# Patient Record
Sex: Female | Born: 1950 | Race: White | Hispanic: No | Marital: Married | Smoking: Former smoker
Health system: Southern US, Community
[De-identification: ages and names within clinical notes are randomized; demographics above are authoritative.]

## PROBLEM LIST (undated history)

## (undated) DIAGNOSIS — I878 Other specified disorders of veins: Secondary | ICD-10-CM

## (undated) DIAGNOSIS — B029 Zoster without complications: Secondary | ICD-10-CM

## (undated) DIAGNOSIS — F32A Depression, unspecified: Secondary | ICD-10-CM

## (undated) DIAGNOSIS — IMO0001 Reserved for inherently not codable concepts without codable children: Secondary | ICD-10-CM

## (undated) DIAGNOSIS — K219 Gastro-esophageal reflux disease without esophagitis: Secondary | ICD-10-CM

## (undated) DIAGNOSIS — N952 Postmenopausal atrophic vaginitis: Secondary | ICD-10-CM

## (undated) DIAGNOSIS — F329 Major depressive disorder, single episode, unspecified: Secondary | ICD-10-CM

## (undated) DIAGNOSIS — R600 Localized edema: Secondary | ICD-10-CM

## (undated) DIAGNOSIS — Z87442 Personal history of urinary calculi: Secondary | ICD-10-CM

## (undated) DIAGNOSIS — I10 Essential (primary) hypertension: Secondary | ICD-10-CM

## (undated) DIAGNOSIS — I890 Lymphedema, not elsewhere classified: Secondary | ICD-10-CM

## (undated) DIAGNOSIS — R413 Other amnesia: Secondary | ICD-10-CM

## (undated) DIAGNOSIS — K760 Fatty (change of) liver, not elsewhere classified: Secondary | ICD-10-CM

## (undated) DIAGNOSIS — E669 Obesity, unspecified: Secondary | ICD-10-CM

## (undated) DIAGNOSIS — K5792 Diverticulitis of intestine, part unspecified, without perforation or abscess without bleeding: Secondary | ICD-10-CM

## (undated) DIAGNOSIS — E785 Hyperlipidemia, unspecified: Secondary | ICD-10-CM

## (undated) DIAGNOSIS — I719 Aortic aneurysm of unspecified site, without rupture: Secondary | ICD-10-CM

## (undated) DIAGNOSIS — M1990 Unspecified osteoarthritis, unspecified site: Secondary | ICD-10-CM

## (undated) DIAGNOSIS — L570 Actinic keratosis: Secondary | ICD-10-CM

## (undated) HISTORY — DX: Reserved for inherently not codable concepts without codable children: IMO0001

## (undated) HISTORY — DX: Zoster without complications: B02.9

## (undated) HISTORY — PX: APPENDECTOMY: SHX54

## (undated) HISTORY — PX: OTHER SURGICAL HISTORY: SHX169

## (undated) HISTORY — DX: Diverticulitis of intestine, part unspecified, without perforation or abscess without bleeding: K57.92

## (undated) HISTORY — DX: Postmenopausal atrophic vaginitis: N95.2

## (undated) HISTORY — DX: Gastro-esophageal reflux disease without esophagitis: K21.9

## (undated) HISTORY — PX: AUGMENTATION MAMMAPLASTY: SUR837

## (undated) HISTORY — DX: Essential (primary) hypertension: I10

## (undated) HISTORY — PX: EYE SURGERY: SHX253

## (undated) HISTORY — DX: Hyperlipidemia, unspecified: E78.5

## (undated) HISTORY — PX: TOTAL KNEE ARTHROPLASTY: SHX125

## (undated) HISTORY — DX: Fatty (change of) liver, not elsewhere classified: K76.0

## (undated) HISTORY — DX: Aortic aneurysm of unspecified site, without rupture: I71.9

## (undated) HISTORY — PX: LIPOSUCTION: SHX10

## (undated) HISTORY — DX: Other amnesia: R41.3

## (undated) HISTORY — DX: Localized edema: R60.0

## (undated) HISTORY — DX: Depression, unspecified: F32.A

## (undated) HISTORY — DX: Major depressive disorder, single episode, unspecified: F32.9

## (undated) HISTORY — PX: KIDNEY STONE SURGERY: SHX686

## (undated) HISTORY — PX: ABDOMINAL HYSTERECTOMY: SHX81

## (undated) HISTORY — PX: CYSTOSCOPY/RETROGRADE/URETEROSCOPY/STONE EXTRACTION WITH BASKET: SHX5317

## (undated) HISTORY — PX: KNEE SURGERY: SHX244

## (undated) HISTORY — DX: Obesity, unspecified: E66.9

## (undated) HISTORY — DX: Unspecified osteoarthritis, unspecified site: M19.90

---

## 1999-05-22 ENCOUNTER — Ambulatory Visit (HOSPITAL_COMMUNITY): Admission: RE | Admit: 1999-05-22 | Discharge: 1999-05-22 | Payer: Self-pay | Admitting: General Surgery

## 2001-06-28 ENCOUNTER — Encounter: Payer: Self-pay | Admitting: Family Medicine

## 2001-06-28 ENCOUNTER — Encounter: Admission: RE | Admit: 2001-06-28 | Discharge: 2001-06-28 | Payer: Self-pay | Admitting: Family Medicine

## 2001-12-20 ENCOUNTER — Ambulatory Visit (HOSPITAL_COMMUNITY): Admission: RE | Admit: 2001-12-20 | Discharge: 2001-12-20 | Payer: Self-pay | Admitting: Gastroenterology

## 2002-04-17 ENCOUNTER — Other Ambulatory Visit: Admission: RE | Admit: 2002-04-17 | Discharge: 2002-04-17 | Payer: Self-pay | Admitting: Obstetrics and Gynecology

## 2003-01-09 ENCOUNTER — Encounter: Payer: Self-pay | Admitting: Family Medicine

## 2003-01-09 ENCOUNTER — Encounter: Admission: RE | Admit: 2003-01-09 | Discharge: 2003-01-09 | Payer: Self-pay | Admitting: Family Medicine

## 2003-02-13 ENCOUNTER — Ambulatory Visit (HOSPITAL_COMMUNITY): Admission: RE | Admit: 2003-02-13 | Discharge: 2003-02-13 | Payer: Self-pay | Admitting: Orthopedic Surgery

## 2003-03-17 ENCOUNTER — Encounter: Payer: Self-pay | Admitting: *Deleted

## 2003-03-17 ENCOUNTER — Emergency Department (HOSPITAL_COMMUNITY): Admission: EM | Admit: 2003-03-17 | Discharge: 2003-03-17 | Payer: Self-pay | Admitting: *Deleted

## 2003-04-19 ENCOUNTER — Ambulatory Visit (HOSPITAL_COMMUNITY): Admission: RE | Admit: 2003-04-19 | Discharge: 2003-04-19 | Payer: Self-pay | Admitting: Family Medicine

## 2003-04-19 ENCOUNTER — Encounter: Payer: Self-pay | Admitting: Family Medicine

## 2003-04-23 ENCOUNTER — Other Ambulatory Visit: Admission: RE | Admit: 2003-04-23 | Discharge: 2003-04-23 | Payer: Self-pay | Admitting: Obstetrics and Gynecology

## 2004-02-18 ENCOUNTER — Ambulatory Visit (HOSPITAL_BASED_OUTPATIENT_CLINIC_OR_DEPARTMENT_OTHER): Admission: RE | Admit: 2004-02-18 | Discharge: 2004-02-18 | Payer: Self-pay | Admitting: Family Medicine

## 2004-04-29 ENCOUNTER — Other Ambulatory Visit: Admission: RE | Admit: 2004-04-29 | Discharge: 2004-04-29 | Payer: Self-pay | Admitting: Obstetrics and Gynecology

## 2005-05-18 ENCOUNTER — Other Ambulatory Visit: Admission: RE | Admit: 2005-05-18 | Discharge: 2005-05-18 | Payer: Self-pay | Admitting: Obstetrics and Gynecology

## 2006-05-04 ENCOUNTER — Encounter: Admission: RE | Admit: 2006-05-04 | Discharge: 2006-05-04 | Payer: Self-pay | Admitting: Family Medicine

## 2006-07-19 ENCOUNTER — Other Ambulatory Visit: Admission: RE | Admit: 2006-07-19 | Discharge: 2006-07-19 | Payer: Self-pay | Admitting: Obstetrics and Gynecology

## 2007-07-09 ENCOUNTER — Emergency Department (HOSPITAL_COMMUNITY): Admission: EM | Admit: 2007-07-09 | Discharge: 2007-07-09 | Payer: Self-pay | Admitting: Emergency Medicine

## 2007-07-21 ENCOUNTER — Other Ambulatory Visit: Admission: RE | Admit: 2007-07-21 | Discharge: 2007-07-21 | Payer: Self-pay | Admitting: Obstetrics and Gynecology

## 2008-03-21 ENCOUNTER — Encounter: Admission: RE | Admit: 2008-03-21 | Discharge: 2008-03-21 | Payer: Self-pay | Admitting: Family Medicine

## 2008-09-02 ENCOUNTER — Encounter: Admission: RE | Admit: 2008-09-02 | Discharge: 2008-09-02 | Payer: Self-pay | Admitting: Family Medicine

## 2008-10-15 ENCOUNTER — Encounter: Payer: Self-pay | Admitting: Obstetrics and Gynecology

## 2008-10-15 ENCOUNTER — Ambulatory Visit: Payer: Self-pay | Admitting: Obstetrics and Gynecology

## 2008-10-15 ENCOUNTER — Other Ambulatory Visit: Admission: RE | Admit: 2008-10-15 | Discharge: 2008-10-15 | Payer: Self-pay | Admitting: Obstetrics and Gynecology

## 2009-01-31 ENCOUNTER — Encounter: Admission: RE | Admit: 2009-01-31 | Discharge: 2009-01-31 | Payer: Self-pay | Admitting: Family Medicine

## 2009-03-12 ENCOUNTER — Ambulatory Visit: Payer: Self-pay | Admitting: Vascular Surgery

## 2009-05-02 ENCOUNTER — Encounter: Payer: Self-pay | Admitting: Pulmonary Disease

## 2009-05-12 ENCOUNTER — Encounter: Payer: Self-pay | Admitting: Pulmonary Disease

## 2009-05-12 ENCOUNTER — Ambulatory Visit (HOSPITAL_COMMUNITY): Admission: RE | Admit: 2009-05-12 | Discharge: 2009-05-12 | Payer: Self-pay | Admitting: Family Medicine

## 2009-06-03 ENCOUNTER — Encounter: Payer: Self-pay | Admitting: *Deleted

## 2009-06-03 DIAGNOSIS — K219 Gastro-esophageal reflux disease without esophagitis: Secondary | ICD-10-CM | POA: Insufficient documentation

## 2009-06-03 DIAGNOSIS — E785 Hyperlipidemia, unspecified: Secondary | ICD-10-CM | POA: Insufficient documentation

## 2009-06-03 DIAGNOSIS — I059 Rheumatic mitral valve disease, unspecified: Secondary | ICD-10-CM | POA: Insufficient documentation

## 2009-06-03 DIAGNOSIS — I10 Essential (primary) hypertension: Secondary | ICD-10-CM | POA: Insufficient documentation

## 2009-06-04 ENCOUNTER — Ambulatory Visit: Payer: Self-pay | Admitting: Pulmonary Disease

## 2009-06-04 DIAGNOSIS — R0602 Shortness of breath: Secondary | ICD-10-CM | POA: Insufficient documentation

## 2009-06-04 HISTORY — DX: Shortness of breath: R06.02

## 2009-06-18 ENCOUNTER — Encounter: Admission: RE | Admit: 2009-06-18 | Discharge: 2009-06-18 | Payer: Self-pay | Admitting: Surgery

## 2009-10-29 ENCOUNTER — Other Ambulatory Visit: Admission: RE | Admit: 2009-10-29 | Discharge: 2009-10-29 | Payer: Self-pay | Admitting: Obstetrics and Gynecology

## 2009-10-29 ENCOUNTER — Ambulatory Visit: Payer: Self-pay | Admitting: Obstetrics and Gynecology

## 2009-11-28 ENCOUNTER — Ambulatory Visit: Payer: Self-pay | Admitting: Obstetrics and Gynecology

## 2011-02-17 ENCOUNTER — Other Ambulatory Visit (HOSPITAL_COMMUNITY)
Admission: RE | Admit: 2011-02-17 | Discharge: 2011-02-17 | Disposition: A | Discharge status: Home / Self Care | Payer: 59 | Source: Ambulatory Visit | Attending: Obstetrics and Gynecology | Admitting: Obstetrics and Gynecology

## 2011-02-17 ENCOUNTER — Encounter (INDEPENDENT_AMBULATORY_CARE_PROVIDER_SITE_OTHER): Payer: 59 | Admitting: Obstetrics and Gynecology

## 2011-02-17 ENCOUNTER — Other Ambulatory Visit: Payer: Self-pay | Admitting: Obstetrics and Gynecology

## 2011-02-17 DIAGNOSIS — Z833 Family history of diabetes mellitus: Secondary | ICD-10-CM

## 2011-02-17 DIAGNOSIS — Z124 Encounter for screening for malignant neoplasm of cervix: Secondary | ICD-10-CM | POA: Insufficient documentation

## 2011-02-17 DIAGNOSIS — E079 Disorder of thyroid, unspecified: Secondary | ICD-10-CM

## 2011-02-17 DIAGNOSIS — R823 Hemoglobinuria: Secondary | ICD-10-CM

## 2011-02-17 DIAGNOSIS — Z01419 Encounter for gynecological examination (general) (routine) without abnormal findings: Secondary | ICD-10-CM

## 2011-02-17 DIAGNOSIS — Z13220 Encounter for screening for lipoid disorders: Secondary | ICD-10-CM

## 2011-03-02 NOTE — Consult Note (Signed)
NEW PATIENT CONSULTATION   [name], [name]  DOB:  [birth year]                                       03/12/2009  FGBMS#:[id number]   The patient presents today for evaluation of bilateral lower extremity  swelling and night cramps.  She is a very pleasant, healthy, 60 year old  female who reports that she is having increasingly severe night cramps.  She reports that these awaken her and that she has to walk for relief.  She also reports that she is having increased swelling in both lower  extremities.  She noticed this most around her distal calves and ankles.  This is relieved with elevating her legs on a cushion overnight.  She  does not have any pain associated with this other than the discomfort  with the night cramps.   PAST MEDICAL HISTORY:  Significant for hypertension and elevated  cholesterol.   FAMILY HISTORY:  Negative for premature atherosclerotic disease.   SOCIAL HISTORY:  She is married with two children.  She does not smoke,  having quit in 1999.  She does not drink alcohol on a regular basis.   REVIEW OF SYSTEMS:  Positive for weight gain, shortness of breath with  exertion, arthritic joint pain.   ALLERGIES:  MORPHINE.   CURRENT MEDICATIONS:  1. Micardis.  2. Crestor.  3. Pepcid.   PHYSICAL EXAMINATION:  Vital signs:  Heart rate is 88, blood pressure  128/88, pulse 18.  Her dorsalis pedis pulses are 2+ bilaterally.  She  does have moderate pitting edema of both lower extremities.  She does  not have any evidence of varicose veins or reticular veins.  She does  have a few scattered telangiectasias over both lower extremities.   She underwent screening duplex by myself and this showed normal caliber  saphenous vein with no evidence of reflux.  I discussed the significance  of this with the patient.  I explained that the night cramping is not  related to arterial or venous insufficiency.  I explained that in the  past quinine was  available for this, but is no longer available.  I also  explained that the swelling does not appear to be related to any  evidence of venous insufficiency.  I explained that this is simply  excess fluid and the treatment would be elevation, compression and  possible diuretic treatment.  I did explain the pressure garments to  her.  I do feel that I would recommend this at this time since her  symptoms are mild and would not warrant the aggravation of compression  garments.  She understands this is a possibility if she has progression  of her swelling.  She was reassured with this discussion and will see [place]  again on an as-needed basis.   Rosetta Posner, M.D.  Electronically Signed   TFE/MEDQ  D:  03/12/2009  T:  03/13/2009  Job:  [id number]   cc:   Osvaldo Human, M.D.

## 2011-03-05 NOTE — Procedures (Signed)
[place]  Patient:    DEOLA, REWIS Visit Number: [id number] MRN: [id number]          Service Type: END Location: ENDO Attending Physician:  Sherrin Daisy Dictated by:   Joyice Faster. Oletta Lamas, M.D. Proc. Date: 12/20/01 Admit Date:  12/20/2001   CC:         Lindwood Qua, M.D.   Procedure Report  DATE OF BIRTH:  October 08, 1951  PROCEDURE PERFORMED:  Colonoscopy and coagulation of polyps.  ENDOSCOPIST:  Joyice Faster. Oletta Lamas, M.D.  MEDICATIONS USED:  Fentanyl 150 mcg, Versed 10 mg IV.  INSTRUMENT:  Pediatric Olympus video colonoscope.  INDICATIONS:  Strong family history of colon polyps.  DESCRIPTION OF PROCEDURE:  The procedure had been explained to the patient and consent obtained.  With the patient in the left lateral decubitus position, the Olympus pediatric video colonoscope was inserted and advanced under direct visualization.  The prep was excellent.  The patient had moderate diverticulosis.  We were able to easily pass this to advance to the cecum. The ileocecal valve and appendiceal orifice were seen.  The scope was withdrawn.  The cecum, ascending colon, hepatic flexure, transverse colon, splenic flexure, descending colon were seen well.  In the middescending colon a 3 to 4 mm sessile polyp was seen.  It was fairly flat.  I removed it with a snare, but unfortunately it was so small there was no tissue left after we closed the snare.  The remainder of the descending colon and the sigmoid colon were normal other than diverticulosis.  There were no polyps in the rectum. Scope withdrawn, patient tolerated the procedure well.  Maintained on low flow oxygen and pulse oximeter throughout the procedure.  ASSESSMENT: 1. Small polyp in the descending colon lost. 2. Moderate diverticulosis.  PLAN:  Routine follow-up with a repeat in five years.  Will send home on polypectomy precautions. Dictated by:   Joyice Faster. Oletta Lamas,  M.D. Attending Physician:  Sherrin Daisy DD:  12/20/01 TD:  12/20/01 Job: [id number]

## 2011-03-05 NOTE — Op Note (Signed)
   NAME:  Patricia [name], Patricia [name]                     ACCOUNT NO.:  [id number]   MEDICAL RECORD NO.:  [id number]                   PATIENT TYPE:  AMB   LOCATION:  DAY                                  FACILITY:  [facility]   PHYSICIAN:  Ronald A. Gladstone Lighter, M.D.             DATE OF BIRTH:  Dec 26, 1950   DATE OF PROCEDURE:  02/13/2003  DATE OF DISCHARGE:  02/13/2003                                 OPERATIVE REPORT   SURGEON:  Jori Moll A. Gioffre, M.D.   ASSISTANT:  None.   PROCEDURE:  1. Diagnostic arthroscopy, left knee.  2. Medial meniscectomy, left knee.  3. Abrasion chondroplasty of the medial femoral condyle, left knee.  4. Synovectomy of the suprapatellar pouch, left knee.   PREOPERATIVE DIAGNOSES:  1. Tear of the medial meniscus.  2. Degenerative arthritis, left knee.   POSTOPERATIVE DIAGNOSES:  1. Tear of the medial meniscus.  2. Degenerative arthritis, left knee.   DESCRIPTION OF PROCEDURE:  After routine orthopedic prep and draping of the  left knee was carried out, a small punctate incision was made in the  suprapatellar pouch. The inflow cannula was inserted and the knee was  distended with saline. At this time, the arthroscope was entered from the  lateral approach. Complete diagnostic arthroscopy was carried out and I  noted she had a tear of the medial meniscus so I introduced the shaver  suction device and did a medial meniscectomy. She also had rather  significant arthritic changes of the medial femoral condyle and introduced  the shaver suction device and did an abrasion chondroplasty. 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 I introduced the shaver suction  device and did a synovectomy. No other abnormalities were noted. The lateral  joint space looked fine. I thoroughly irrigated out the wound and closed all  three punctate incisions with 3-0 nylon suture. I injected 30 mL of 0.5%  Marcaine with epinephrine into the knee joint and applied a sterile  dressing. The patient left the operating room in satisfactory condition. She  had 1 g of IV Ancef preop. This was done under general anesthesia.                                               Ronald A. Gladstone Lighter, M.D.    RAG/MEDQ  D:  03/14/2003  T:  03/14/2003  Job:  [id number]

## 2011-07-29 LAB — DIFFERENTIAL
Eosinophils Absolute: 0
Lymphocytes Relative: 16
Lymphs Abs: 1.7
Monocytes Relative: 5
Neutrophils Relative %: 78 — ABNORMAL HIGH

## 2011-07-29 LAB — CBC
Hemoglobin: 13.8
MCHC: 35.2
MCV: 88.3
RDW: 13

## 2011-07-29 LAB — COMPREHENSIVE METABOLIC PANEL
ALT: 21
Calcium: 9.5
Creatinine, Ser: 0.61
GFR calc Af Amer: >60
GFR calc non Af Amer: >60
Glucose, Bld: 101 — ABNORMAL HIGH
Sodium: 141
Total Protein: 6.9

## 2011-10-23 ENCOUNTER — Emergency Department (HOSPITAL_COMMUNITY)
Admission: EM | Admit: 2011-10-23 | Discharge: 2011-10-24 | Disposition: A | Discharge status: Home / Self Care | Payer: 59 | Attending: Emergency Medicine | Admitting: Emergency Medicine

## 2011-10-23 ENCOUNTER — Encounter: Payer: Self-pay | Admitting: *Deleted

## 2011-10-23 ENCOUNTER — Emergency Department (HOSPITAL_COMMUNITY)
Admission: EM | Admit: 2011-10-23 | Discharge: 2011-10-23 | Disposition: A | Discharge status: Home / Self Care | Payer: 59

## 2011-10-23 ENCOUNTER — Other Ambulatory Visit: Payer: Self-pay

## 2011-10-23 DIAGNOSIS — Z79899 Other long term (current) drug therapy: Secondary | ICD-10-CM | POA: Insufficient documentation

## 2011-10-23 DIAGNOSIS — R110 Nausea: Secondary | ICD-10-CM | POA: Insufficient documentation

## 2011-10-23 DIAGNOSIS — M549 Dorsalgia, unspecified: Secondary | ICD-10-CM

## 2011-10-23 DIAGNOSIS — R42 Dizziness and giddiness: Secondary | ICD-10-CM | POA: Insufficient documentation

## 2011-10-23 DIAGNOSIS — E7800 Pure hypercholesterolemia, unspecified: Secondary | ICD-10-CM | POA: Insufficient documentation

## 2011-10-23 DIAGNOSIS — M546 Pain in thoracic spine: Secondary | ICD-10-CM | POA: Insufficient documentation

## 2011-10-23 DIAGNOSIS — I10 Essential (primary) hypertension: Secondary | ICD-10-CM | POA: Insufficient documentation

## 2011-10-23 DIAGNOSIS — R61 Generalized hyperhidrosis: Secondary | ICD-10-CM | POA: Insufficient documentation

## 2011-10-23 DIAGNOSIS — R079 Chest pain, unspecified: Secondary | ICD-10-CM | POA: Insufficient documentation

## 2011-10-23 NOTE — ED Notes (Signed)
Pt reports CP that was substernal, radiating through to pts back, and (R) jaw and (R) arm.  Reports taking a tums without much relief.  Denies SOB, reports slight SOB.  Skin warm, dry and intact.

## 2011-10-23 NOTE — ED Notes (Signed)
Patient has been called three times without answer

## 2011-10-24 ENCOUNTER — Emergency Department (HOSPITAL_COMMUNITY): Payer: 59

## 2011-10-24 LAB — CBC
HCT: 43.7 % (ref 36.0–46.0)
Hemoglobin: 14.8 g/dL (ref 12.0–15.0)
MCH: 30.4 pg (ref 26.0–34.0)
MCV: 89.7 fL (ref 78.0–100.0)
Platelets: 271 10*3/uL (ref 150–400)
RBC: 4.87 MIL/uL (ref 3.87–5.11)
WBC: 6.4 10*3/uL (ref 4.0–10.5)

## 2011-10-24 LAB — BASIC METABOLIC PANEL
BUN: 17 mg/dL (ref 6–23)
CO2: 34 mEq/L — ABNORMAL HIGH (ref 19–32)
Calcium: 9.7 mg/dL (ref 8.4–10.5)
Chloride: 97 mEq/L (ref 96–112)
Creatinine, Ser: 0.72 mg/dL (ref 0.50–1.10)
Glucose, Bld: 99 mg/dL (ref 70–99)

## 2011-10-24 LAB — POCT I-STAT TROPONIN I: Troponin i, poc: 0 ng/mL (ref 0.00–0.08)

## 2011-10-24 MED ORDER — IOHEXOL 350 MG/ML SOLN
100.0000 mL | Freq: Once | INTRAVENOUS | Status: AC | PRN
  Administered 2011-10-24: 100 mL via INTRAVENOUS

## 2011-10-24 MED ORDER — ONDANSETRON HCL 4 MG/2ML IJ SOLN
4.0000 mg | Freq: Once | INTRAMUSCULAR | Status: AC
  Administered 2011-10-24: 4 mg via INTRAVENOUS
  Filled 2011-10-24: qty 2

## 2011-10-24 MED ORDER — FENTANYL CITRATE 0.05 MG/ML IJ SOLN
50.0000 ug | Freq: Once | INTRAMUSCULAR | Status: AC
  Administered 2011-10-24: 50 ug via INTRAVENOUS
  Filled 2011-10-24: qty 2

## 2011-10-24 MED ORDER — LOPERAMIDE HCL 2 MG PO CAPS
4.0000 mg | ORAL_CAPSULE | Freq: Once | ORAL | Status: AC
  Administered 2011-10-24: 4 mg via ORAL
  Filled 2011-10-24: qty 2

## 2011-10-24 MED ORDER — FENTANYL CITRATE 0.05 MG/ML IJ SOLN
25.0000 ug | INTRAMUSCULAR | Status: DC | PRN
  Administered 2011-10-24: 25 ug via INTRAVENOUS
  Filled 2011-10-24: qty 2

## 2011-10-24 MED ORDER — CYCLOBENZAPRINE HCL 5 MG PO TABS: 5.0000 mg | ORAL_TABLET | Freq: Three times a day (TID) | ORAL | Status: AC | PRN

## 2011-10-24 MED ORDER — HYDROCODONE-ACETAMINOPHEN 5-325 MG PO TABS: 2.0000 | ORAL_TABLET | Freq: Four times a day (QID) | ORAL | Status: AC | PRN

## 2011-10-24 NOTE — ED Notes (Signed)
Pt ambualted with a steady a gait; VSS; no signs of distress; A&Ox3; respirations even and unlabored; no questions at this time.

## 2011-10-24 NOTE — ED Notes (Signed)
Patient being transported to CT.

## 2011-10-24 NOTE — ED Notes (Signed)
Patient complaining of chest pain; states that the pain started around 1400 yesterday.  Patient states that her pain has decreased over the course of the day, and that most of the pain is in her upper back.  Describes location of chest pain as "mid-sternum"; unable to describe pain but states that it is "much better than it was"; rates pain 2/10.  Rates upper back pain 2/10; describes pain as "dull" and "aching".  Denies shortness of breath, diaphoresis, vomiting, blurred vision, and numbness/tingling in hands/feet.  Reports nausea.  Only cardiac history is mitral valve prolapse.  Upon arrival to room, patient connected to continuous cardiac, pulse ox, and blood pressure monitor.  Family present at bedside. Will continue to monitor.

## 2011-10-24 NOTE — ED Provider Notes (Signed)
History     CSN: [id number]  Arrival date & time 10/23/11  2337   First MD Initiated Contact with Patient 10/24/11 0002      Chief Complaint  Patient presents with  . Chest Pain    (Consider location/radiation/quality/duration/timing/severity/associated sxs/prior treatment) HPI  Patient relates about 2:30 PM she was riding in a car for husband and she started getting pain in the center of her chest described as pressure and tightness that radiated into her upper back between her shoulder blades. She states it lasted about an hour and a half. She denies nausea vomiting diaphoresis or shortness of breath. She states about 9:30 this evening she was playing cards with friends and she started getting the same pain. She states it radiates into her back again. She had some nausea and feels a little lightheaded, she had some mild diaphoresis on her face, but she denies shortness of breath. It did not radiate anywhere except her back. She took TUMS thinking it might be heartburn without relief. She states nothing makes it feel better nothing makes it feel worse. She states her pain was an 8 at its worst and is currently 3. She has never had this discomfort prior to today. She does not related to eating any types of foods.  PCP Dr. Serita Grammes  History reviewed. No pertinent past medical history. Hypertension High cholesterol Mitral valve prolapse  Past Surgical History  Procedure Date  . Joint replacement   . Abdominal hysterectomy     History reviewed. No pertinent family history. MOP had a cardiac stent a couple of years ago  History  Substance Use Topics  . Smoking status: Former Smoker -- 1.0 packs/day for 25 years  . Smokeless tobacco: Not on file  . 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    Allergies  Morphine and Sulfonamide derivatives  Home Medications   Current Outpatient Rx  Name Route Sig Dispense Refill  . OMEPRAZOLE 20 MG PO CPDR Oral Take 20 mg by mouth daily.      . TRIAMTERENE-HCTZ 37.5-25 MG PO TABS Oral Take 1 tablet by mouth daily.        BP 138/55  Pulse 90  Temp(Src) 97.5 F (36.4 C) (Oral)  Resp 18  SpO2 100% Vital signs normal    Physical Exam  Nursing note and vitals reviewed. Constitutional: She is oriented to person, place, and time. She appears well-developed and well-nourished.  Non-toxic appearance. She does not appear ill. No distress.  HENT:  Head: Normocephalic and atraumatic.  Right Ear: External ear normal.  Left Ear: External ear normal.  Nose: Nose normal. No mucosal edema or rhinorrhea.  Mouth/Throat: Oropharynx is clear and moist and mucous membranes are normal. No dental abscesses or uvula swelling.  Eyes: Conjunctivae and EOM are normal. Pupils are equal, round, and reactive to light.  Neck: Normal range of motion and full passive range of motion without pain. Neck supple.  Cardiovascular: Normal rate, regular rhythm and normal heart sounds.  Exam reveals no gallop and no friction rub.   No murmur heard. Pulmonary/Chest: Effort normal and breath sounds normal. No respiratory distress. She has no wheezes. She has no rhonchi. She has no rales. She  exhibits no tenderness and no crepitus.  Abdominal: Soft. Normal appearance and bowel sounds are normal. She exhibits no distension. There is no tenderness. There is no rebound and no guarding.  Musculoskeletal: Normal range of motion. She exhibits no edema and no tenderness.       Moves all extremities well.   Neurological: She is alert and oriented to person, place, and time. She has normal strength. No cranial nerve deficit.  Skin: Skin is warm, dry and intact. No rash noted. No erythema. No pallor.  Psychiatric: She has a normal mood and affect. Her speech is normal  and behavior is normal. Her mood appears not anxious.    ED Course  Procedures (including critical care time)  PT received IV fentanyl for pain which almost relieved her pain.  03:51 Dr Cyndia Bent states she can be seen in the office for routine follow-up.  04:42 pt given final test results, states her pain is mild, but would like something else before she leaves.   Results for orders placed during the hospital encounter of 10/23/11  CBC      Component Value Range   WBC 6.4  4.0 - 10.5 (K/uL)   RBC 4.87  3.87 - 5.11 (MIL/uL)   Hemoglobin 14.8  12.0 - 15.0 (g/dL)   HCT 43.7  36.0 - 46.0 (%)   MCV 89.7  78.0 - 100.0 (fL)   MCH 30.4  26.0 - 34.0 (pg)   MCHC 33.9  30.0 - 36.0 (g/dL)   RDW 13.1  11.5 - 15.5 (%)   Platelets 271  150 - 400 (K/uL)  BASIC METABOLIC PANEL      Component Value Range   Sodium 142  135 - 145 (mEq/L)   Potassium 3.7  3.5 - 5.1 (mEq/L)   Chloride 97  96 - 112 (mEq/L)   CO2 34 (*) 19 - 32 (mEq/L)   Glucose, Bld 99  70 - 99 (mg/dL)   BUN 17  6 - 23 (mg/dL)   Creatinine, Ser 0.72  0.50 - 1.10 (mg/dL)   Calcium 9.7  8.4 - 10.5 (mg/dL)   GFR calc non Af Amer >90  >90 (mL/min)   GFR calc Af Amer >90  >90 (mL/min)  POCT I-STAT TROPONIN I      Component Value Range   Troponin i, poc 0.00  0.00 - 0.08 (ng/mL)   Comment 3           POCT I-STAT TROPONIN I      Component Value Range   Troponin i, poc 0.00  0.00 - 0.08 (ng/mL)   Comment 3            Laboratory interpretation all normal  Dg Chest 2 View  10/24/2011  *RADIOLOGY REPORT*  Clinical Data: 61 year old female with chest pain.  CHEST - 2 VIEW  Comparison: 06/04/2009  Findings: The cardiomediastinal silhouette is unremarkable. Mild peribronchial thickening is again noted. There is no evidence of focal airspace disease, pulmonary edema, pulmonary nodule/mass, pleural effusion, or pneumothorax. No acute bony abnormalities are identified.  IMPRESSION: No evidence of active cardiopulmonary disease.  Original Report  Authenticated By: Lura Em, M.D.   Ct Angio Chest W/cm &/or Wo Cm  10/24/2011  *RADIOLOGY REPORT*  Clinical Data:  Chest pain, radiating to the back.  CT ANGIOGRAPHY CHEST WITH CONTRAST  Technique:  Multidetector CT imaging of the chest was performed using the standard protocol during bolus administration of intravenous contrast.  Multiplanar CT image reconstructions including MIPs were obtained  to evaluate the vascular anatomy.  Contrast: 123m OMNIPAQUE IOHEXOL 350 MG/ML IV SOLN  Comparison:  10/24/2011 radiograph  Findings:  Noncontrast images demonstrate coronary artery calcification and ectasia of the ascending aorta.  No aneurysmal dilatation.  Scattered atherosclerotic calcification.  Following intravenous contrast administration, the ascending aorta is dilated and measures up to 4.1 cm.  No dissection flap.  The caliber transitions to a normal diameter along the arch, and remains normal caliber throughout the descending thoracic aortic portion.  While not optimized to evaluate the pulmonary arterial branches, no filling defect within the main branches identified.  No intrathoracic lymphadenopathy.  No pleural or pericardial effusion.  Limited images through the upper abdomen demonstrate nonobstructing left renal stones.  Colonic diverticulosis.  The central airways are patent.  There is minimal opacification in the lingula.  No pneumothorax.  No acute osseous abnormality.  Review of the MIP images confirms the above findings.  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 Mild scattered atherosclerotic disease.  No dissection.  Mild opacity of the lingula; atelectasis versus infiltrate. Otherwise, no acute intrathoracic process identified.  Nonobstructing left renal stone.  Colonic diverticulosis noted at the splenic flexure.  Original Report Authenticated By: ASuanne Marker M.D.       Date: 10/24/2011  Rate: 93  Rhythm: normal sinus rhythm  QRS Axis: normal   Intervals: normal  ST/T Wave abnormalities: normal  Conduction Disutrbances:none  Narrative Interpretation: PRWP, LAE  Old EKG Reviewed: unchanged from February 13 2003     1. Chest pain   2. Back pain     New Prescriptions   CYCLOBENZAPRINE (FLEXERIL) 5 MG TABLET    Take 1 tablet (5 mg total) by mouth 3 (three) times daily as needed for muscle spasms.   HYDROCODONE-ACETAMINOPHEN (NORCO) 5-325 MG PER TABLET    Take 2 tablets by mouth every 6 (six) hours as needed for pain.   Plan discharge  IRolland Porter MD, [place] MD 10/24/11 [phone]

## 2011-10-24 NOTE — ED Notes (Signed)
Patient back from CT; currently sitting up in bed; no respiratory or acute distress noted.  Patient updated on plan of care; informed patient that we are currently waiting on CT results to come back.  Family present at bedside.  Patient has no other questions or concerns at this time.  Will continue to monitor.

## 2011-10-24 NOTE — ED Notes (Signed)
Patient back from x-ray; currently sitting up in bed; no respiratory or acute distress noted.  Patient complaining of diarrhea; Dr. Tomi Bamberger notified.  Updated patient on plan of care; informed patient that we are currently waiting on x-ray results and that she will be taken to CT next.  CT called and notified that patient is ready.  Patient has no other questions or concerns at this time.  Will continue to monitor.

## 2011-12-24 ENCOUNTER — Other Ambulatory Visit: Payer: Self-pay | Admitting: Dermatology

## 2012-02-11 ENCOUNTER — Encounter: Payer: Self-pay | Admitting: Obstetrics and Gynecology

## 2012-03-03 ENCOUNTER — Other Ambulatory Visit: Payer: Self-pay | Admitting: Dermatology

## 2012-05-09 ENCOUNTER — Other Ambulatory Visit: Payer: Self-pay | Admitting: Family Medicine

## 2012-05-09 DIAGNOSIS — I719 Aortic aneurysm of unspecified site, without rupture: Secondary | ICD-10-CM

## 2012-05-10 ENCOUNTER — Ambulatory Visit
Admission: RE | Admit: 2012-05-10 | Discharge: 2012-05-10 | Disposition: A | Discharge status: Home / Self Care | Payer: No Typology Code available for payment source | Source: Ambulatory Visit | Attending: Family Medicine | Admitting: Family Medicine

## 2012-05-10 DIAGNOSIS — I719 Aortic aneurysm of unspecified site, without rupture: Secondary | ICD-10-CM

## 2012-05-10 MED ORDER — IOHEXOL 350 MG/ML SOLN
80.0000 mL | Freq: Once | INTRAVENOUS | Status: AC | PRN
  Administered 2012-05-10: 80 mL via INTRAVENOUS

## 2012-07-14 ENCOUNTER — Other Ambulatory Visit: Payer: Self-pay | Admitting: Family Medicine

## 2012-07-18 ENCOUNTER — Ambulatory Visit
Admission: RE | Admit: 2012-07-18 | Discharge: 2012-07-18 | Disposition: A | Discharge status: Home / Self Care | Payer: 59 | Source: Ambulatory Visit | Attending: Family Medicine | Admitting: Family Medicine

## 2012-07-19 ENCOUNTER — Other Ambulatory Visit: Payer: Self-pay

## 2012-09-21 ENCOUNTER — Other Ambulatory Visit: Payer: Self-pay | Admitting: Dermatology

## 2012-09-27 ENCOUNTER — Encounter: Payer: Self-pay | Admitting: Gynecology

## 2012-09-27 DIAGNOSIS — IMO0001 Reserved for inherently not codable concepts without codable children: Secondary | ICD-10-CM | POA: Insufficient documentation

## 2012-09-27 DIAGNOSIS — N952 Postmenopausal atrophic vaginitis: Secondary | ICD-10-CM | POA: Insufficient documentation

## 2012-10-09 ENCOUNTER — Encounter: Payer: 59 | Admitting: Obstetrics and Gynecology

## 2012-10-10 ENCOUNTER — Ambulatory Visit (INDEPENDENT_AMBULATORY_CARE_PROVIDER_SITE_OTHER): Payer: 59 | Admitting: Obstetrics and Gynecology

## 2012-10-10 ENCOUNTER — Encounter: Payer: Self-pay | Admitting: Obstetrics and Gynecology

## 2012-10-10 VITALS — BP 130/80 | Ht 64.0 in | Wt 184.0 lb

## 2012-10-10 DIAGNOSIS — I719 Aortic aneurysm of unspecified site, without rupture: Secondary | ICD-10-CM | POA: Insufficient documentation

## 2012-10-10 DIAGNOSIS — K760 Fatty (change of) liver, not elsewhere classified: Secondary | ICD-10-CM | POA: Insufficient documentation

## 2012-10-10 DIAGNOSIS — F329 Major depressive disorder, single episode, unspecified: Secondary | ICD-10-CM | POA: Insufficient documentation

## 2012-10-10 DIAGNOSIS — Z01419 Encounter for gynecological examination (general) (routine) without abnormal findings: Secondary | ICD-10-CM

## 2012-10-10 DIAGNOSIS — F32A Depression, unspecified: Secondary | ICD-10-CM | POA: Insufficient documentation

## 2012-10-10 NOTE — Patient Instructions (Signed)
Get a bone density at the time of the next mammogram. Continue yearly mammograms.

## 2012-10-10 NOTE — Progress Notes (Signed)
Patient came to see me today for her annual GYN exam. In 1983 she had a total doll hysterectomy for fibroids. She is now menopausal and is doing well without hormone replacement therapy. Last year she complained of atrophic vaginitis. We gave her estradiol vaginal cream 0.02% but she stopped it because a mastodynia. She is now uncomfortable with intercourse. She is up-to-date on mammograms. Her last bone density was 2011 and was normal. She has always had normal Pap smears. Her last Pap smear was 2012. She does her lab through her PCP. She is having no vaginal bleeding. She is having no pelvic pain.  HEENT: Within normal limits.Caryn Bee present. Neck: No masses. Supraclavicular lymph nodes: Not enlarged. Breasts: Examined in both sitting and lying position. Symmetrical without skin changes or masses. Abdomen: Soft no masses guarding or rebound. No hernias. Pelvic: External within normal limits. BUS within normal limits. Vaginal examination shows poor  estrogen effect, no cystocele enterocele or rectocele. Cervix and uterus absent. Adnexa within normal limits. Rectovaginal confirmatory. Extremities within normal limits.  Assessment: Atrophic vaginitis  Plan: She will try hyalogyn gel. If not successful will call Vagifem. She will continue yearly mammograms. She will get a bone density with her next mammogram. Pap not done.The new Pap smear guidelines were discussed with the patient.

## 2013-02-26 ENCOUNTER — Encounter: Payer: Self-pay | Admitting: Family Medicine

## 2013-02-26 ENCOUNTER — Ambulatory Visit (INDEPENDENT_AMBULATORY_CARE_PROVIDER_SITE_OTHER): Payer: 59 | Admitting: Family Medicine

## 2013-02-26 VITALS — BP 142/84 | HR 89 | Resp 14 | Ht 64.0 in | Wt 195.0 lb

## 2013-02-26 DIAGNOSIS — E669 Obesity, unspecified: Secondary | ICD-10-CM | POA: Insufficient documentation

## 2013-02-26 DIAGNOSIS — I10 Essential (primary) hypertension: Secondary | ICD-10-CM | POA: Insufficient documentation

## 2013-02-26 MED ORDER — CHORIONIC GONADOTROPIN 10000 UNITS IM SOLR: INTRAMUSCULAR | Status: DC

## 2013-02-26 MED ORDER — PHENDIMETRAZINE TARTRATE 35 MG PO TABS: 1.0000 | ORAL_TABLET | Freq: Three times a day (TID) | ORAL | Status: DC

## 2013-02-26 MED ORDER — CYANOCOBALAMIN 1000 MCG/ML IJ SOLN: INTRAMUSCULAR | Status: DC

## 2013-02-26 NOTE — Progress Notes (Signed)
Subjective:    Patient ID: Patricia [name], female    DOB: 11-17-1950, 62 y.o.   MRN: [id number]  HPI Patricia [name] is here today for a check of her BP and to begin the Step By Step Diet & Fitness Program.  She has tried numerous diet programs and has not been successful with them.  She feels that she needs to lose weight to improve her general health. She has noticed that since she has put back on her weight her BP has been running higher.     Review of Systems  Constitutional: Positive for unexpected weight change. Negative for activity change and fatigue.  HENT: Negative.   Eyes: Negative.   Respiratory: Negative for shortness of breath.   Cardiovascular: Negative for chest pain, palpitations and leg swelling.  Gastrointestinal: Negative for diarrhea and constipation.  Endocrine: Negative.   Genitourinary: Negative for difficulty urinating.  Musculoskeletal: Negative.   Skin: Negative.   Neurological: Negative.   Hematological: Negative for adenopathy. Does not bruise/bleed easily.  Psychiatric/Behavioral: Negative for sleep disturbance and dysphoric mood. The patient is not nervous/anxious.     Past Medical History  Diagnosis Date  . Reflux   . Atrophic vaginitis   . Fibroid   . Aneurysm of aorta   . Depression   . Fatty liver   . Hypertension   . Obesity   . Hyperlipidemia   . Edema of both legs     She takes Lasix a couple of times per week to decrease swelling.     Family History  Problem Relation Age of Onset  . Uterine cancer Mother   . Heart disease Mother   . Diabetes Mother   . Alzheimer's disease Father   . Cancer Paternal Grandmother     Gallbladder cancer  . Liver cancer Paternal Grandfather   . Breast cancer Cousin     Paternal 1st cousin Age 84   History   Social History Narrative   Marital Status: Married Engineer, technical sales)   Children:  Sons (2)    Pets: Cat    Living Situation: Lives with husband    Occupation: Therapist, occupational   Education:  Programmer, systems    Tobacco Use/Exposure:  None    Alcohol Use:  Occasional   Drug Use:  None   Diet:  Regular   Exercise:  Bikram Yoga    Hobbies: Gardening/ Reading                 Objective:   Physical Exam  Constitutional: She appears well-nourished. No distress.  HENT:  Head: Normocephalic.  Eyes: No scleral icterus.  Neck: No thyromegaly present.  Cardiovascular: Normal rate, regular rhythm and normal heart sounds.   Pulmonary/Chest: Effort normal and breath sounds normal.  Abdominal: There is no tenderness.  Musculoskeletal: She exhibits no edema and no tenderness.  Neurological: She is alert.  Skin: Skin is warm and dry.  Psychiatric: She has a normal mood and affect. Her behavior is normal. Judgment and thought content normal.      Assessment & Plan:   1)  Hypertension - Her BP will decrease when she loses weight.  In looking back over her last pressure in our office, it was 110/79 when she was 23 lbs lighter.  She will keep a check on her pressure.  She may need to take 1/2 tab of the Maxzide if her pressure drops too low especially on days that she is doing her Bikram Yoga.  2)  Obesity - The patient is going to begin the "Step By Step" program.  They will take daily IM injections of hCG and will follow the 500 calorie diet as illustrated in Dr. Maretta Los' "Pounds & Inches".  They will also take Phendimetrazine to suppress appetite. They were also instructed to get at least one hour of exercise daily.  TIME 20 MINUTES:  MORE THAN 50 % OF TIME WAS INVOLVED IN COUNSELING.

## 2013-02-26 NOTE — Patient Instructions (Addendum)
1)  Hypertension - Your pressure should decrease with weight loss. Keep a check on your BP. If it gets too low then take 1/2 of the Maxzide and you might even consider holding it on days that you are doing your yoga.     Hypertension Hypertension is another name for high blood pressure. High blood pressure may mean that your heart needs to work harder to pump blood. Blood pressure consists of two numbers, which includes a higher number over a lower number (example: 110/72). HOME CARE   Make lifestyle changes as told by your doctor. This may include weight loss and exercise.  Take your blood pressure medicine every day.  Limit how much salt you use.  Stop smoking if you smoke.  Do not use drugs.  Talk to your doctor if you are using decongestants or birth control pills. These medicines might make blood pressure higher.  Females should not drink more than 1 alcoholic drink per day. Males should not drink more than 2 alcoholic drinks per day.  See your doctor as told. GET HELP RIGHT AWAY IF:   You have a blood pressure reading with a top number of 180 or higher.  You get a very bad headache.  You get blurred or changing vision.  You feel confused.  You feel weak, numb, or faint.  You get chest or belly (abdominal) pain.  You throw up (vomit).  You cannot breathe very well. MAKE SURE YOU:   Understand these instructions.  Will watch your condition.  Will get help right away if you are not doing well or get worse. Document Released: 03/22/2008 Document Revised: 12/27/2011 Document Reviewed: 03/22/2008 [facility] Patient Information 2013 [place].

## 2013-03-09 ENCOUNTER — Ambulatory Visit: Payer: 59

## 2013-03-16 ENCOUNTER — Ambulatory Visit: Payer: 59 | Admitting: *Deleted

## 2013-03-16 DIAGNOSIS — E663 Overweight: Secondary | ICD-10-CM

## 2013-03-23 ENCOUNTER — Ambulatory Visit: Payer: 59 | Admitting: *Deleted

## 2013-03-23 VITALS — Wt 184.0 lb

## 2013-03-23 DIAGNOSIS — E663 Overweight: Secondary | ICD-10-CM

## 2013-03-30 ENCOUNTER — Ambulatory Visit (INDEPENDENT_AMBULATORY_CARE_PROVIDER_SITE_OTHER): Payer: 59 | Admitting: Family Medicine

## 2013-03-30 ENCOUNTER — Encounter: Payer: Self-pay | Admitting: Family Medicine

## 2013-03-30 VITALS — BP 127/86 | HR 78 | Ht 65.0 in | Wt 182.0 lb

## 2013-03-30 DIAGNOSIS — E669 Obesity, unspecified: Secondary | ICD-10-CM

## 2013-03-30 DIAGNOSIS — I10 Essential (primary) hypertension: Secondary | ICD-10-CM

## 2013-03-30 MED ORDER — PHENTERMINE HCL 37.5 MG PO TABS: 37.5000 mg | ORAL_TABLET | Freq: Every day | ORAL | Status: DC

## 2013-03-30 MED ORDER — PHENTERMINE-TOPIRAMATE ER 3.75-23 MG PO CP24: 1.0000 | ORAL_CAPSULE | Freq: Every day | ORAL | Status: DC

## 2013-03-30 MED ORDER — PHENTERMINE-TOPIRAMATE ER 7.5-46 MG PO CP24: 1.0000 | ORAL_CAPSULE | Freq: Every day | ORAL | Status: DC

## 2013-03-30 NOTE — Progress Notes (Signed)
  Subjective:    Patient ID: Patricia [name], female    DOB: 1951-01-25, 62 y.o.   MRN: [id number]  HPI  Patricia [name] is here today for a follow up of her blood pressure and her weight loss. She has just completed her 4th week of the "Step By Step"  Program.  She is taking Phendimetrazine without any problem. She has also been receiving HCG injections without difficulty.  She has been following the 500 calorie diet and has lost 13 lbs since she is started the plan.  She says that she feels great.     Review of Systems  Constitutional: Positive for fatigue. Negative for activity change, appetite change and unexpected weight change.  Respiratory: Negative for shortness of breath.   Cardiovascular: Negative for chest pain and palpitations.  Gastrointestinal: Negative.   Genitourinary: Negative.   Neurological: Negative.  Negative for light-headedness.  Psychiatric/Behavioral: Negative.    Past Medical History  Diagnosis Date  . Reflux   . Atrophic vaginitis   . Fibroid   . Aneurysm of aorta   . Depression   . Fatty liver   . Hypertension   . Obesity   . Hyperlipidemia   . Edema of both legs     She takes Lasix a couple of times per week to decrease swelling.     Family History  Problem Relation Age of Onset  . Uterine cancer Mother   . Heart disease Mother   . Diabetes Mother   . Hypertension Mother   . Hyperlipidemia Mother   . Alzheimer's disease Father   . Hypertension Father   . Cancer Paternal Grandmother     Gallbladder cancer  . Liver cancer Paternal Grandfather   . Breast cancer Cousin     Paternal 1st cousin Age 62  . Depression Sister    History   Social History Narrative   Marital Status: Married Engineer, technical sales)   Children:  Sons (2)    Pets: Cat    Living Situation: Lives with husband    Occupation: Therapist, occupational   Education: Programmer, systems    Tobacco Use/Exposure:  None    Alcohol Use:  Occasional   Drug Use:  None   Diet:  Regular   Exercise:  Bikram Yoga    Hobbies: Gardening/ Reading                 Objective:   Physical Exam  Constitutional: She appears well-nourished. No distress.  HENT:  Head: Normocephalic.  Eyes: No scleral icterus.  Neck: No thyromegaly present.  Cardiovascular: Normal rate, regular rhythm and normal heart sounds.   Pulmonary/Chest: Effort normal and breath sounds normal.  Abdominal: There is no tenderness.  Musculoskeletal: She exhibits no edema and no tenderness.  Neurological: She is alert.  Skin: Skin is warm and dry.  Psychiatric: She has a normal mood and affect. Her behavior is normal. Judgment and thought content normal.          Assessment & Plan:

## 2013-03-30 NOTE — Patient Instructions (Addendum)
Diet Following Bariatric Surgery The bariatric diet is designed to provide fluids and nourishment while promoting weight loss after bariatric surgery. The diet is divided into 3 stages. The rate of progression varies based on individual food tolerance. DIET FOLLOWING BARIATRIC SURGERY The diet following surgery is divided into 3 stages to allow a gradual adjustment. It is very important to the success of your surgery to:  Progress to each stage slowly.  Eat at set times.  Chew food well and stop eating when you are full.  Not drink liquids 30 minutes before and after meals. If you feel tightness or pressure in your chest, that means you are full. Wait 30 minutes before you try to eat again. STAGE 1 BARIATRIC DIET - ABOUT 2 WEEKS IN DURATION   The diet begins the day of surgery. It will last about 1 to 2 weeks after surgery. Your surgeon may have individual guidelines for you about specific foods or the progression of your diet. Follow your surgeon's guidelines.  If clear liquids are well-tolerated without vomiting, your caregiver will add a 4 oz to 6 oz high protein, low-calorie liquid supplement. You could add this to your meal plan 3 times daily. You will need at least 60 g to 80 g of protein daily or as determined by your Registered Dietitian.  Guidelines for choosing a protein supplement include:  At least 15 g of protein per 8 oz serving.  Less than 20 g total carbohydrate per 8 oz serving.  Less than 5 g fat per 8 oz serving.  Avoid carbonated beverages, caffeine, alcohol, and concentrated sweets such as sugar, cakes, and cookies.  Right after surgery, you may only be able to eat 3 to 4 tsp per meal. Your maximum volume should not exceed  to  cup total. Do not eat or drink more than 1 oz or 2 tbs every 15 minutes.  Take a chewable multivitamin and mineral supplement.  Drink at least 48 oz of fluid daily, which includes your protein supplement. Food and beverages from the  list below are allowed at set times (for example at 8 AM, 12 noon, or 5 PM):  Decaffeinated coffee or tea.  100% fruit juice.  Diet or sugar-free drinks.  Broth.  Blenderized soup.  Skim milk or lactose-free milk.  Sugar-free gelatin dessert or frozen ice pops.  Mashed potatoes.  Yogurt (artificially sweetened).  Sugar-free pudding.  Blended low-fat cottage cheese.  Unsweetened applesauce, grits, or hot wheat cereal. Four to six ounces of a liquid protein supplement from the list below is recommended for snacks at 10 AM, 2 PM, and 8 PM.  STAGE 2 BARIATRIC DIET (SOFT DIET) - ABOUT 4 WEEKS IN DURATION  About 2 weeks after surgery, your caregiver will progress your diet to this stage. Foods may need to be blended to the consistency of applesauce. Choose low-fat foods (less than 5 g of fat per serving) and avoid concentrated sweets and sugar (less than 10 g of sugar per serving). Meals should not exceed  to  cup total. This stage will last about 4 weeks. It is recommended that you meet with your dietitian at this stage to begin preparation for the last stage. This stage consists of 3 meals a day with a liquid protein supplement between meals twice daily. Do not drink liquids with foods. You must wait 30 minutes for the stomach pouch to empty before drinking. Chew food well. The food must be almost liquified before swallowing. Soft foods from the  list below can now be slowly added to your diet:  Soft fruit (soft canned fruit in light syrup or natural juice, banana, melon, peaches, pears, or strawberries).  Cooked vegetables.  Toast or crackers (becomes soft after chewing 20 times).  Hot wheat cereal.  Fish.  Eggs (scrambled, soft-boiled). STAGE 3 BARIATRIC DIET (REGULAR DIET) - ABOUT 6 to 8 WEEKS AFTER SURGERY About 6 to 8 weeks after surgery, you will be advanced to food that is regular in texture. This diet should include all food groups. The diet will continue to promote  weight loss. Meals should not exceed  to 1 cup total. Your dietitian will be available to assist you in meal planning and additional behavioral strategies to make this final stage a long-term success. Slowly add foods of regular consistency and remember:  Eat only at your chosen meal times.  Minimize drinking with meals. You should drink 30 minutes before eating. Do not start drinking again for about 2 hours after eating.  Chew food well. Take small bites.  Think about the portion size of a healthy frozen meal. You will be able to eat most of this.  Make sure your meal is balanced with starch, protein, fruits, and vegetables.  When you feel full, stop eating. Document Released: 04/10/2003 Document Revised: 12/27/2011 Document Reviewed: 01/01/2011 [facility] Patient Information 2014 [place], [place].

## 2013-04-27 NOTE — Assessment & Plan Note (Signed)
Her BP is now within normal limits.

## 2013-04-27 NOTE — Assessment & Plan Note (Signed)
She was given prescriptions for Qsymia.

## 2013-10-16 ENCOUNTER — Ambulatory Visit (INDEPENDENT_AMBULATORY_CARE_PROVIDER_SITE_OTHER): Payer: 59 | Admitting: Gynecology

## 2013-10-16 ENCOUNTER — Telehealth: Payer: Self-pay | Admitting: *Deleted

## 2013-10-16 ENCOUNTER — Encounter: Payer: Self-pay | Admitting: Gynecology

## 2013-10-16 VITALS — BP 122/76 | Ht 64.0 in | Wt 194.0 lb

## 2013-10-16 DIAGNOSIS — IMO0002 Reserved for concepts with insufficient information to code with codable children: Secondary | ICD-10-CM

## 2013-10-16 DIAGNOSIS — N898 Other specified noninflammatory disorders of vagina: Secondary | ICD-10-CM

## 2013-10-16 DIAGNOSIS — N949 Unspecified condition associated with female genital organs and menstrual cycle: Secondary | ICD-10-CM

## 2013-10-16 DIAGNOSIS — Z01419 Encounter for gynecological examination (general) (routine) without abnormal findings: Secondary | ICD-10-CM

## 2013-10-16 DIAGNOSIS — N952 Postmenopausal atrophic vaginitis: Secondary | ICD-10-CM

## 2013-10-16 LAB — WET PREP FOR TRICH, YEAST, CLUE
Clue Cells Wet Prep HPF POC: NONE SEEN
Trich, Wet Prep: NONE SEEN

## 2013-10-16 MED ORDER — FLUCONAZOLE 150 MG PO TABS: 150.0000 mg | ORAL_TABLET | Freq: Once | ORAL | Status: DC

## 2013-10-16 MED ORDER — NONFORMULARY OR COMPOUNDED ITEM: Status: DC

## 2013-10-16 NOTE — Telephone Encounter (Signed)
Patricia [name] is going to faxed dexa and mammogram report.

## 2013-10-16 NOTE — Progress Notes (Signed)
Patricia [name] [birth year] [id number]        62 y.o.  G2P2002 for annual exam.  Former patient of Dr. Cherylann Banas. Several issues noted below.  Past medical history,surgical history, problem list, medications, allergies, family history and social history were all reviewed and documented in the EPIC chart.  ROS:  Performed and pertinent positives and negatives are included in the history, assessment and plan .  Exam: Kim assistant Filed Vitals:   10/16/13 1134  BP: 122/76  Height: 5' 4"  (1.626 m)  Weight: 194 lb (87.998 kg)   General appearance  Normal Skin grossly normal Head/Neck normal with no cervical or supraclavicular adenopathy thyroid normal Lungs  clear Cardiac RR, without RMG Abdominal  soft, nontender, without masses, organomegaly or hernia Breasts  examined lying and sitting without masses, retractions, discharge or axillary adenopathy. Bilateral implants noted. Pelvic  Ext/BUS/vagina  atrophic genital changes. Scant discharge. Wet prep done.  Adnexa  Without masses or tenderness    Anus and perineum  Normal   Rectovaginal  Normal sphincter tone without palpated masses or tenderness.    Assessment/Plan:  62 y.o. G33P2002 female for annual exam.   1. Postmenopausal/atrophic genital changes. Patient is status post TAH for leiomyoma. Is having chronic issues with vaginal dryness and dyspareunia such that she does not have intercourse. Had trials of vaginal estrogen cream in the past but stopped it due to breast tenderness. Also appeared historically to have tried Vagifem. Has used over-the-counter moisturizers/lubricants without benefit. I reviewed options to include OTC products, vaginal estrogen cream, Vagifem and Osphena. The pros/cons, risks/benefits reviewed. Potential for absorption with increased risk of stroke heart attack DVT and breast cancer all discussed. After lengthy discussion the patient wants a trial of vaginal estrogen cream again. Will use formulated  estradiol cream [facility]. Patient will call me if she has any issues with this. 2. Vaginal odor. Patient does note a vaginal odor that comes and goes. Her exam is unremarkable but the wet prep was positive for yeast. Will treat with Diflucan 150 mg x1 dose. Followup if symptoms persist. 3. Pap smear 2012. No Pap smear done today. No history of abnormal Pap smears previously. Current screening guidelines reviewed. Is status post hysterectomy for benign indications. Options to stop screening reviewed and she is comfortable with this. 4. Mammography in the chart 2013 the patient states that she did have it this year. We'll try to get copy of this. Continue with annual mammography. SBE monthly reviewed. 5. DEXA 2011 in chart normal. Patient again feels that she's had one since then we'll try to get a copy of this. Increase calcium vitamin D reviewed. 6. Colonoscopy 2013. Repeat at their recommended interval. 7. Health maintenance. No routine blood work done as she reports this all done through her primary physician's office. Followup one year, sooner as needed.   Note: This document was prepared with digital dictation and possible smart phrase technology. Any transcriptional errors that result from this process are unintentional.   Anastasio Auerbach MD, 12:29 PM 10/16/2013

## 2013-10-16 NOTE — Patient Instructions (Signed)
Start on the vaginal estrogen cream twice weekly. Call me if you have any issues with this. Take the Diflucan pill once. Followup in one year, sooner if any issues.

## 2013-10-16 NOTE — Telephone Encounter (Signed)
Message copied by Thamas Jaegers on Tue Oct 16, 2013  2:52 PM ------      Message from: Anastasio Auerbach      Created: Tue Oct 16, 2013 12:11 PM       #1 [facility] estrogen applicators twice weekly three-month supply refill x1 year and Diflucan 150 mg x1 dose      #2 call [facility] for most recent mammogram and DEXA report ------

## 2013-10-16 NOTE — Telephone Encounter (Signed)
rx called in and diflucan sent.

## 2013-10-17 LAB — URINALYSIS W MICROSCOPIC + REFLEX CULTURE
Casts: NONE SEEN
Crystals: NONE SEEN
Glucose, UA: NEGATIVE mg/dL
Specific Gravity, Urine: 1.014 (ref 1.005–1.030)
Urobilinogen, UA: 0.2 mg/dL (ref 0.0–1.0)
pH: 7.5 (ref 5.0–8.0)

## 2013-10-19 ENCOUNTER — Encounter: Payer: Self-pay | Admitting: Gynecology

## 2013-10-22 ENCOUNTER — Encounter: Payer: Self-pay | Admitting: Obstetrics and Gynecology

## 2013-11-06 DIAGNOSIS — Z719 Counseling, unspecified: Secondary | ICD-10-CM | POA: Insufficient documentation

## 2013-12-20 ENCOUNTER — Encounter: Payer: Self-pay | Admitting: Neurology

## 2013-12-20 ENCOUNTER — Telehealth: Payer: Self-pay | Admitting: Neurology

## 2013-12-20 ENCOUNTER — Ambulatory Visit (INDEPENDENT_AMBULATORY_CARE_PROVIDER_SITE_OTHER): Payer: 59 | Admitting: Neurology

## 2013-12-20 ENCOUNTER — Encounter (INDEPENDENT_AMBULATORY_CARE_PROVIDER_SITE_OTHER): Payer: Self-pay

## 2013-12-20 VITALS — BP 112/78 | HR 83 | Temp 98.4°F | Ht 63.5 in | Wt 174.5 lb

## 2013-12-20 DIAGNOSIS — F3289 Other specified depressive episodes: Secondary | ICD-10-CM

## 2013-12-20 DIAGNOSIS — R413 Other amnesia: Secondary | ICD-10-CM

## 2013-12-20 DIAGNOSIS — E669 Obesity, unspecified: Secondary | ICD-10-CM

## 2013-12-20 DIAGNOSIS — F32A Depression, unspecified: Secondary | ICD-10-CM

## 2013-12-20 DIAGNOSIS — F329 Major depressive disorder, single episode, unspecified: Secondary | ICD-10-CM

## 2013-12-20 DIAGNOSIS — Z820 Family history of epilepsy and other diseases of the nervous system: Secondary | ICD-10-CM

## 2013-12-20 NOTE — Telephone Encounter (Signed)
Please call in Xanax prn for MRI: 0.5 mg strength, take one pill on call to MRI, may repeat times one, 2 pills with 0 refills.
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
## 2013-12-20 NOTE — Telephone Encounter (Signed)
The patient has not been scheduled yet, but would like to know if she can have Xanax for her MRI.

## 2013-12-20 NOTE — Patient Instructions (Addendum)
I think overall you are doing fairly well but I do want to suggest a few things today:  Remember to drink plenty of fluid, eat healthy meals and do not skip any meals. Try to eat protein with a every meal and eat a healthy snack such as fruit or nuts in between meals. Try to keep a regular sleep-wake schedule and try to exercise daily, particularly in the form of walking, 20-30 minutes a day, if you can.   Engage in social activities in your community and with your family and try to keep up with current events by reading the newspaper or watching the news.   As far as your medications are concerned, I would like to suggest no new medications, continue Aricept in low dose.      As far as diagnostic testing: MRI brain without contrast, blood work.  I would like to see you back in 3 months, sooner if we need to. Please call us with any interim questions, concerns, problems, updates or refill requests.  Please also call us for any test results so we can go over those with you on the phone. Our nursing staff will answer any of your questions and relay your messages to me and also relay most of my messages to you.  Our phone number is [phone]. We also have an after hours call service for urgent matters and there is a physician on-call for urgent questions. For any emergencies you know to call 911 or go to the nearest emergency room.

## 2013-12-20 NOTE — Telephone Encounter (Signed)
I called the Rx into [facility].  As well I called the patient to advise.  She will pick up the Rx and says her MRI is scheduled for Saturday.

## 2013-12-22 DIAGNOSIS — R413 Other amnesia: Secondary | ICD-10-CM

## 2013-12-24 ENCOUNTER — Other Ambulatory Visit: Payer: Self-pay | Admitting: Neurology

## 2013-12-24 DIAGNOSIS — F32A Depression, unspecified: Secondary | ICD-10-CM

## 2013-12-24 DIAGNOSIS — R413 Other amnesia: Secondary | ICD-10-CM

## 2013-12-24 DIAGNOSIS — F329 Major depressive disorder, single episode, unspecified: Secondary | ICD-10-CM

## 2013-12-24 DIAGNOSIS — E669 Obesity, unspecified: Secondary | ICD-10-CM

## 2013-12-24 DIAGNOSIS — Z820 Family history of epilepsy and other diseases of the nervous system: Secondary | ICD-10-CM

## 2013-12-25 ENCOUNTER — Other Ambulatory Visit (HOSPITAL_COMMUNITY): Payer: 59

## 2013-12-25 NOTE — Progress Notes (Signed)
Quick Note:  Please call patient regarding the recent brain MRI: The brain scan showed a normal structure of the brain and mild volume loss which we call atrophy. Again, there were no acute findings, such as a stroke, or mass or blood products. No further action is required on this test at this time. Please remind patient to keep any upcoming appointments or tests and to call us with any interim questions, concerns, problems or updates. Thanks,  Star Age, MD, PhD    ______

## 2013-12-26 NOTE — Progress Notes (Signed)
Quick Note:  Shared MR Brain results per Dr Guadelupe Sabin findings, verbalized understanding ______

## 2014-01-17 ENCOUNTER — Ambulatory Visit (INDEPENDENT_AMBULATORY_CARE_PROVIDER_SITE_OTHER): Payer: 59 | Admitting: Family Medicine

## 2014-01-17 ENCOUNTER — Encounter: Payer: Self-pay | Admitting: Family Medicine

## 2014-01-17 VITALS — BP 109/71 | HR 88 | Resp 16 | Wt 168.0 lb

## 2014-01-17 DIAGNOSIS — R413 Other amnesia: Secondary | ICD-10-CM

## 2014-01-17 DIAGNOSIS — E663 Overweight: Secondary | ICD-10-CM

## 2014-01-17 MED ORDER — PHENTERMINE-TOPIRAMATE ER 11.25-69 MG PO CP24: 1.0000 | ORAL_CAPSULE | Freq: Every day | ORAL | Status: DC

## 2014-01-17 MED ORDER — PHOSPHATIDYLSERINE-DHA-EPA 100-19.5-6.5 MG PO CAPS: 1.0000 | ORAL_CAPSULE | Freq: Every day | ORAL | Status: DC

## 2014-01-17 MED ORDER — CEREFOLIN NAC 6-2-600 MG PO TABS: 1.0000 | ORAL_TABLET | Freq: Every day | ORAL | Status: DC

## 2014-01-17 NOTE — Progress Notes (Signed)
Subjective:    Patient ID: Patricia [name], female    DOB: 05-14-1951, 64 y.o.   MRN: [id number]  HPI  Patricia [name] is here today to discuss a couple of issues.    1)  Weight - She has been taking Qsymia and has been following a low carb diet.  She would like to continue on it and needs a refill.   2)  Memory - She has been having some trouble with her memory.      Review of Systems  Constitutional: Positive for fatigue and unexpected weight change. Negative for activity change and appetite change.  Respiratory: Negative for shortness of breath.   Cardiovascular: Negative for chest pain and palpitations.  Gastrointestinal: Negative.   Genitourinary: Negative.   Neurological: Negative.        Memory Troubles  Psychiatric/Behavioral: Negative.      Past Medical History  Diagnosis Date  . Reflux   . Atrophic vaginitis   . Aneurysm of aorta   . Depression   . Fatty liver   . Hypertension   . Obesity   . Hyperlipidemia   . Edema of both legs     She takes Lasix a couple of times per week to decrease swelling.    . Memory difficulty   . Shingles      Past Surgical History  Procedure Laterality Date  . Abdominal hysterectomy    . Cesarean section      X 2  . Knee surgery    . Basal and squamous cell cancers excised    . Augmentation mammaplasty       History   Social History Narrative   Marital Status: Married Engineer, technical sales)   Children:  Sons (2)    Pets: Cat    Living Situation: Lives with husband    Occupation: Therapist, occupational - [employer]   Education: Programmer, systems    Tobacco Use/Exposure:  None    Alcohol Use:  Occasional   Drug Use:  None   Diet:  Regular   Exercise:  Bikram Yoga    Hobbies: Gardening/ Reading   Patient is right handed                 Family History  Problem Relation Age of Onset  . Uterine cancer Mother   . Heart disease Mother   . Diabetes Mother   . Hypertension Mother   . Hyperlipidemia  Mother   . Alzheimer's disease Father   . Hypertension Father   . Cancer Paternal Grandmother     Gallbladder cancer  . Diverticulitis Paternal Grandmother     gallbladder  . Liver cancer Paternal Grandfather   . Diverticulitis Paternal Grandfather   . Breast cancer Cousin     Paternal 1st cousin Age 29  . Depression Sister   . Bipolar disorder Sister   . Diverticulitis Maternal Grandmother      Current Outpatient Prescriptions on File Prior to Visit  Medication Sig Dispense Refill  . BIOTIN PO Take by mouth. 3 times daily      . Donepezil HCl (ARICEPT PO) Take by mouth.      . Esomeprazole Magnesium (NEXIUM PO) Take by mouth. OTC-low dose      . Phentermine-Topiramate (QSYMIA) 7.5-46 MG CP24 Take by mouth. One tablet once daily      . SIMVASTATIN PO Take by mouth.      . triamterene-hydrochlorothiazide (MAXZIDE-25) 37.5-25 MG per tablet Take 1 tablet by mouth daily.  No current facility-administered medications on file prior to visit.     Allergies  Allergen Reactions  . Morphine     REACTION: itch  . Tetracyclines & Related Other (See Comments)    Stomach pain       Objective:   Physical Exam  Vitals reviewed. Constitutional: She is oriented to person, place, and time.  HENT:  Head: Normocephalic and atraumatic.  Eyes: Conjunctivae are normal. No scleral icterus.  Neck: Normal range of motion. Neck supple. No thyromegaly present.  Cardiovascular: Normal rate and regular rhythm.   Pulmonary/Chest: Effort normal and breath sounds normal.  Musculoskeletal: She exhibits no edema.  Neurological: She is alert and oriented to person, place, and time.  Skin: Skin is warm and dry. No rash noted.  Psychiatric: She has a normal mood and affect. Her behavior is normal. Judgment and thought content normal.      Assessment & Plan:    Patricia [name] was seen today for medication management.  Diagnoses and associated orders for this  visit:  Overweight - Phentermine-Topiramate (QSYMIA) 11.25-69 MG CP24; Take 1 tablet by mouth daily.  Memory change - Methylfol-Methylcob-Acetylcyst (CEREFOLIN NAC) 6-2-600 MG TABS; Take 1 tablet by mouth daily. - Phosphatidylserine-DHA-EPA 100-19.5-6.5 MG CAPS; Take 1 capsule by mouth daily.

## 2014-01-17 NOTE — Patient Instructions (Addendum)
1)  Weight - Increase the Qsymia to the 11.25/69 dosage taking it daily vs every other day.    2)  Memory - If you feel that you have become more foggy on the higher dosage of the topiramate of the Qsymia you might try adding some L-Tyrosine.  You may also consider a medical food like Vayacog vs Cerefolin or you might just start with an OTC DHA (1000 - 2000 mg) daily.

## 2014-03-01 ENCOUNTER — Other Ambulatory Visit: Payer: Self-pay | Admitting: Dermatology

## 2014-05-07 ENCOUNTER — Ambulatory Visit (INDEPENDENT_AMBULATORY_CARE_PROVIDER_SITE_OTHER): Payer: 59 | Admitting: Family Medicine

## 2014-05-07 VITALS — BP 133/86 | HR 82 | Temp 97.8°F | Resp 16 | Ht 65.0 in | Wt 159.0 lb

## 2014-05-07 DIAGNOSIS — R5383 Other fatigue: Secondary | ICD-10-CM

## 2014-05-07 DIAGNOSIS — R05 Cough: Secondary | ICD-10-CM

## 2014-05-07 DIAGNOSIS — R5381 Other malaise: Secondary | ICD-10-CM

## 2014-05-07 DIAGNOSIS — R059 Cough, unspecified: Secondary | ICD-10-CM

## 2014-05-07 MED ORDER — HYDROCODONE-HOMATROPINE 5-1.5 MG/5ML PO SYRP: 5.0000 mL | ORAL_SOLUTION | Freq: Four times a day (QID) | ORAL | Status: DC | PRN

## 2014-05-07 MED ORDER — BUPROPION HCL ER (XL) 150 MG PO TB24: 150.0000 mg | ORAL_TABLET | Freq: Every day | ORAL | Status: DC

## 2014-05-07 MED ORDER — BENZONATATE 200 MG PO CAPS: 200.0000 mg | ORAL_CAPSULE | Freq: Three times a day (TID) | ORAL | Status: DC | PRN

## 2014-05-07 MED ORDER — AZITHROMYCIN 500 MG PO TABS: 500.0000 mg | ORAL_TABLET | Freq: Every day | ORAL | Status: DC

## 2014-05-07 NOTE — Progress Notes (Signed)
Subjective:    Patient ID: Patricia [name], female    DOB: Aug 15, 1951, 62 y.o.   MRN: [id number]  HPI  Patricia [name] is here today with URI symptoms.  She has been sick for about a week. She describes her symptoms as being moderate in nature and she feels that she is not improving.     Review of Systems  Constitutional: Negative for fever and chills.  HENT: Positive for congestion, postnasal drip, rhinorrhea, sinus pressure, sneezing and sore throat. Negative for ear pain, trouble swallowing and voice change.   Eyes: Negative.  Negative for itching.  Respiratory: Positive for cough and chest tightness. Negative for shortness of breath.   Cardiovascular: Negative for chest pain.  Gastrointestinal: Negative.   Endocrine: Negative.   Musculoskeletal: Negative.  Negative for neck pain and neck stiffness.  Skin: Negative for rash.  Neurological: Positive for headaches.  Hematological: Negative for adenopathy.     Past Medical History  Diagnosis Date  . Reflux   . Atrophic vaginitis   . Aneurysm of aorta   . Depression   . Fatty liver   . Hypertension   . Obesity   . Hyperlipidemia   . Edema of both legs     She takes Lasix a couple of times per week to decrease swelling.    . Memory difficulty   . Shingles      Past Surgical History  Procedure Laterality Date  . Abdominal hysterectomy    . Cesarean section      X 2  . Knee surgery    . Basal and squamous cell cancers excised    . Augmentation mammaplasty       History   Social History Narrative   Marital Status: Married Engineer, technical sales)   Children:  Sons (2)    Pets: Cat    Living Situation: Lives with husband    Occupation: Therapist, occupational - [employer]   Education: Programmer, systems    Tobacco Use/Exposure:  None    Alcohol Use:  Occasional   Drug Use:  None   Diet:  Regular   Exercise:  Bikram Yoga    Hobbies: Gardening/ Reading   Patient is right handed                 Family  History  Problem Relation Age of Onset  . Uterine cancer Mother   . Heart disease Mother   . Diabetes Mother   . Hypertension Mother   . Hyperlipidemia Mother   . Alzheimer's disease Father   . Hypertension Father   . Cancer Paternal Grandmother     Gallbladder cancer  . Diverticulitis Paternal Grandmother     gallbladder  . Liver cancer Paternal Grandfather   . Diverticulitis Paternal Grandfather   . Breast cancer Cousin     Paternal 1st cousin Age 55  . Depression Sister   . Bipolar disorder Sister   . Diverticulitis Maternal Grandmother      Current Outpatient Prescriptions on File Prior to Visit  Medication Sig Dispense Refill  . ALPRAZolam (XANAX) 0.5 MG tablet       . BIOTIN PO Take by mouth. 3 times daily      . Donepezil HCl (ARICEPT PO) Take by mouth.      . Esomeprazole Magnesium (NEXIUM PO) Take by mouth. OTC-low dose      . Methylfol-Methylcob-Acetylcyst (CEREFOLIN NAC) 6-2-600 MG TABS Take 1 tablet by mouth daily.  30 each  11  .  Phentermine-Topiramate (QSYMIA) 11.25-69 MG CP24 Take 1 tablet by mouth daily.  30 capsule  2  . Phentermine-Topiramate (QSYMIA) 7.5-46 MG CP24 Take by mouth. One tablet once daily      . Phosphatidylserine-DHA-EPA 100-19.5-6.5 MG CAPS Take 1 capsule by mouth daily.  30 capsule  11  . SIMVASTATIN PO Take by mouth.      . triamterene-hydrochlorothiazide (MAXZIDE-25) 37.5-25 MG per tablet Take 1 tablet by mouth daily.        . valACYclovir (VALTREX) 1000 MG tablet        No current facility-administered medications on file prior to visit.     Allergies  Allergen Reactions  . Morphine     REACTION: itch  . Tetracyclines & Related Other (See Comments)    Stomach pain      Objective:   Physical Exam  Constitutional: She appears well-nourished. No distress.  HENT:  Head: Normocephalic.  Mouth/Throat: No oropharyngeal exudate.  Eyes: Conjunctivae are normal. Right eye exhibits no discharge. Left eye exhibits no discharge.  Neck:  Neck supple.  Cardiovascular: Normal rate, regular rhythm and normal heart sounds.  Exam reveals no gallop and no friction rub.   No murmur heard. Pulmonary/Chest: Effort normal and breath sounds normal. She has no wheezes. She exhibits no tenderness.  Lymphadenopathy:    She has no cervical adenopathy.  Neurological: She is alert.  Skin: Skin is warm and dry. No rash noted.  Psychiatric: She has a normal mood and affect.      Assessment & Plan:    Patricia [name] was seen today for chest congestion .  Diagnoses and associated orders for this visit:  Cough - benzonatate (TESSALON) 200 MG capsule; Take 1 capsule (200 mg total) by mouth 3 (three) times daily as needed for cough. - HYDROcodone-homatropine (HYCODAN) 5-1.5 MG/5ML syrup; Take 5 mLs by mouth every 6 (six) hours as needed. - azithromycin (ZITHROMAX) 500 MG tablet; Take 1 tablet (500 mg total) by mouth daily. Take 1 tablet daily for 3 days.  Other malaise and fatigue - buPROPion (WELLBUTRIN XL) 150 MG 24 hr tablet; Take 1 tablet (150 mg total) by mouth daily.

## 2014-05-07 NOTE — Patient Instructions (Addendum)
1)  URI - Cold Eeze (Zinc) Lozenges vs Quick Melt - Remember that 2 of the Quickmelts = 1 Lozenge Take up to 5-6 x per day for 3-4 day.  Add Umcka Cold Care    Head Congestion - Pseudoephedrine 120 mg 1-2 x per day plus Mucinex DM 1200/60 twice a day vs Mucinex D 1200/120 twice a day plus Delsym (=DM) 60 mg (2 tsp twice a day).  Add Tessalon Perles 200 mg 3 x per day plus Hycodan Cough Syrup 1 tsp up to 4 x per day or 2 at bedtime.  If you are 2 weeks into this and not better or you worsen then take the Zithromax.     Acute Bronchitis Bronchitis is inflammation of the airways that extend from the windpipe into the lungs (bronchi). The inflammation often causes mucus to develop. This leads to a cough, which is the most common symptom of bronchitis.  In acute bronchitis, the condition usually develops suddenly and goes away over time, usually in a couple weeks. Smoking, allergies, and asthma can make bronchitis worse. Repeated episodes of bronchitis may cause further lung problems.  CAUSES Acute bronchitis is most often caused by the same virus that causes a cold. The virus can spread from person to person (contagious).  SIGNS AND SYMPTOMS   Cough.   Fever.   Coughing up mucus.   Body aches.   Chest congestion.   Chills.   Shortness of breath.   Sore throat.  DIAGNOSIS  Acute bronchitis is usually diagnosed through a physical exam. Tests, such as chest X-rays, are sometimes done to rule out other conditions.  TREATMENT  Acute bronchitis usually goes away in a couple weeks. Often times, no medical treatment is necessary. Medicines are sometimes given for relief of fever or cough. Antibiotics are usually not needed but may be prescribed in certain situations. In some cases, an inhaler may be recommended to help reduce shortness of breath and control the cough. A cool mist vaporizer may also be used to help thin bronchial secretions and make it easier to clear the chest.  HOME CARE  INSTRUCTIONS  Get plenty of rest.   Drink enough fluids to keep your urine clear or pale yellow (unless you have a medical condition that requires fluid restriction). Increasing fluids may help thin your secretions and will prevent dehydration.   Only take over-the-counter or prescription medicines as directed by your health care provider.   Avoid smoking and secondhand smoke. Exposure to cigarette smoke or irritating chemicals will make bronchitis worse. If you are a smoker, consider using nicotine gum or skin patches to help control withdrawal symptoms. Quitting smoking will help your lungs heal faster.   Reduce the chances of another bout of acute bronchitis by washing your hands frequently, avoiding people with cold symptoms, and trying not to touch your hands to your mouth, nose, or eyes.   Follow up with your health care provider as directed.  SEEK MEDICAL CARE IF: Your symptoms do not improve after 1 week of treatment.  SEEK IMMEDIATE MEDICAL CARE IF:  You develop an increased fever or chills.   You have chest pain.   You have severe shortness of breath.  You have bloody sputum.   You develop dehydration.  You develop fainting.  You develop repeated vomiting.  You develop a severe headache. MAKE SURE YOU:   Understand these instructions.  Will watch your condition.  Will get help right away if you are not doing well or  get worse. Document Released: 11/11/2004 Document Revised: 06/06/2013 Document Reviewed: 03/27/2013 [facility] Patient Information 2015 [place], [place]. This information is not intended to replace advice given to you by your health care provider. Make sure you discuss any questions you have with your health care provider.

## 2014-05-14 MED ORDER — AZITHROMYCIN 500 MG PO TABS: 500.0000 mg | ORAL_TABLET | Freq: Every day | ORAL | Status: DC

## 2014-05-14 MED ORDER — AZITHROMYCIN 500 MG PO TABS: 500.0000 mg | ORAL_TABLET | Freq: Every day | ORAL | Status: AC

## 2014-06-13 ENCOUNTER — Ambulatory Visit: Payer: 59 | Admitting: Cardiology

## 2014-06-13 ENCOUNTER — Encounter: Payer: Self-pay | Admitting: Family Medicine

## 2014-06-25 ENCOUNTER — Ambulatory Visit: Payer: 59 | Admitting: Neurology

## 2014-08-19 ENCOUNTER — Encounter: Payer: Self-pay | Admitting: Family Medicine

## 2014-10-01 ENCOUNTER — Other Ambulatory Visit (INDEPENDENT_AMBULATORY_CARE_PROVIDER_SITE_OTHER): Payer: Self-pay

## 2014-10-01 ENCOUNTER — Other Ambulatory Visit (INDEPENDENT_AMBULATORY_CARE_PROVIDER_SITE_OTHER): Payer: Self-pay | Admitting: Surgery

## 2014-10-01 DIAGNOSIS — R2242 Localized swelling, mass and lump, left lower limb: Secondary | ICD-10-CM

## 2014-10-04 ENCOUNTER — Ambulatory Visit
Admission: RE | Admit: 2014-10-04 | Discharge: 2014-10-04 | Disposition: A | Discharge status: Home / Self Care | Payer: 59 | Source: Ambulatory Visit | Attending: Surgery | Admitting: Surgery

## 2014-10-04 ENCOUNTER — Encounter (INDEPENDENT_AMBULATORY_CARE_PROVIDER_SITE_OTHER): Payer: Self-pay

## 2014-10-04 DIAGNOSIS — R2242 Localized swelling, mass and lump, left lower limb: Secondary | ICD-10-CM

## 2014-10-16 ENCOUNTER — Ambulatory Visit (INDEPENDENT_AMBULATORY_CARE_PROVIDER_SITE_OTHER): Payer: Self-pay | Admitting: Surgery

## 2014-10-24 ENCOUNTER — Other Ambulatory Visit: Payer: Self-pay | Admitting: General Surgery

## 2014-10-24 ENCOUNTER — Encounter: Payer: 59 | Admitting: Gynecology

## 2014-10-24 ENCOUNTER — Other Ambulatory Visit: Payer: Self-pay | Admitting: Surgery

## 2014-10-24 DIAGNOSIS — R1012 Left upper quadrant pain: Secondary | ICD-10-CM

## 2014-10-29 DIAGNOSIS — D179 Benign lipomatous neoplasm, unspecified: Secondary | ICD-10-CM | POA: Insufficient documentation

## 2014-12-06 ENCOUNTER — Ambulatory Visit
Admission: RE | Admit: 2014-12-06 | Discharge: 2014-12-06 | Disposition: A | Discharge status: Home / Self Care | Payer: 59 | Source: Ambulatory Visit | Attending: Family Medicine | Admitting: Family Medicine

## 2014-12-06 ENCOUNTER — Encounter: Payer: Self-pay | Admitting: Gynecology

## 2014-12-06 ENCOUNTER — Encounter (HOSPITAL_COMMUNITY): Payer: Self-pay | Admitting: Emergency Medicine

## 2014-12-06 ENCOUNTER — Other Ambulatory Visit: Payer: Self-pay | Admitting: Family Medicine

## 2014-12-06 ENCOUNTER — Ambulatory Visit (INDEPENDENT_AMBULATORY_CARE_PROVIDER_SITE_OTHER): Payer: 59 | Admitting: Gynecology

## 2014-12-06 ENCOUNTER — Telehealth: Payer: Self-pay | Admitting: *Deleted

## 2014-12-06 ENCOUNTER — Emergency Department (HOSPITAL_COMMUNITY)
Admission: EM | Admit: 2014-12-06 | Discharge: 2014-12-06 | Disposition: A | Discharge status: Home / Self Care | Payer: 59 | Attending: Emergency Medicine | Admitting: Emergency Medicine

## 2014-12-06 VITALS — BP 120/70 | Ht 65.0 in | Wt 172.0 lb

## 2014-12-06 DIAGNOSIS — N952 Postmenopausal atrophic vaginitis: Secondary | ICD-10-CM

## 2014-12-06 DIAGNOSIS — Z90710 Acquired absence of both cervix and uterus: Secondary | ICD-10-CM | POA: Diagnosis not present

## 2014-12-06 DIAGNOSIS — K5752 Diverticulitis of both small and large intestine without perforation or abscess without bleeding: Secondary | ICD-10-CM

## 2014-12-06 DIAGNOSIS — F329 Major depressive disorder, single episode, unspecified: Secondary | ICD-10-CM | POA: Diagnosis not present

## 2014-12-06 DIAGNOSIS — K5792 Diverticulitis of intestine, part unspecified, without perforation or abscess without bleeding: Secondary | ICD-10-CM

## 2014-12-06 DIAGNOSIS — I10 Essential (primary) hypertension: Secondary | ICD-10-CM | POA: Diagnosis not present

## 2014-12-06 DIAGNOSIS — K5732 Diverticulitis of large intestine without perforation or abscess without bleeding: Secondary | ICD-10-CM | POA: Insufficient documentation

## 2014-12-06 DIAGNOSIS — Z8742 Personal history of other diseases of the female genital tract: Secondary | ICD-10-CM | POA: Diagnosis not present

## 2014-12-06 DIAGNOSIS — Z79899 Other long term (current) drug therapy: Secondary | ICD-10-CM | POA: Diagnosis not present

## 2014-12-06 DIAGNOSIS — E669 Obesity, unspecified: Secondary | ICD-10-CM | POA: Diagnosis not present

## 2014-12-06 DIAGNOSIS — Z8619 Personal history of other infectious and parasitic diseases: Secondary | ICD-10-CM | POA: Diagnosis not present

## 2014-12-06 DIAGNOSIS — Z8639 Personal history of other endocrine, nutritional and metabolic disease: Secondary | ICD-10-CM | POA: Insufficient documentation

## 2014-12-06 DIAGNOSIS — Z87891 Personal history of nicotine dependence: Secondary | ICD-10-CM | POA: Diagnosis not present

## 2014-12-06 DIAGNOSIS — Z01419 Encounter for gynecological examination (general) (routine) without abnormal findings: Secondary | ICD-10-CM

## 2014-12-06 DIAGNOSIS — R1032 Left lower quadrant pain: Secondary | ICD-10-CM | POA: Diagnosis present

## 2014-12-06 DIAGNOSIS — Z8739 Personal history of other diseases of the musculoskeletal system and connective tissue: Secondary | ICD-10-CM | POA: Diagnosis not present

## 2014-12-06 LAB — CBC WITH DIFFERENTIAL/PLATELET
BASOS PCT: 0 % (ref 0–1)
Basophils Absolute: 0 10*3/uL (ref 0.0–0.1)
Eosinophils Absolute: 0.1 10*3/uL (ref 0.0–0.7)
Eosinophils Relative: 1 % (ref 0–5)
HEMATOCRIT: 41.9 % (ref 36.0–46.0)
HEMOGLOBIN: 13.5 g/dL (ref 12.0–15.0)
LYMPHS PCT: 22 % (ref 12–46)
Lymphs Abs: 1.5 10*3/uL (ref 0.7–4.0)
MCH: 30.1 pg (ref 26.0–34.0)
MCHC: 32.2 g/dL (ref 30.0–36.0)
MCV: 93.3 fL (ref 78.0–100.0)
MONO ABS: 0.4 10*3/uL (ref 0.1–1.0)
Monocytes Relative: 6 % (ref 3–12)
NEUTROS ABS: 4.8 10*3/uL (ref 1.7–7.7)
NEUTROS PCT: 71 % (ref 43–77)
Platelets: 230 10*3/uL (ref 150–400)
RBC: 4.49 MIL/uL (ref 3.87–5.11)
RDW: 13.2 % (ref 11.5–15.5)
WBC: 6.9 10*3/uL (ref 4.0–10.5)

## 2014-12-06 LAB — COMPREHENSIVE METABOLIC PANEL
ALBUMIN: 3.8 g/dL (ref 3.5–5.2)
ALK PHOS: 76 U/L (ref 39–117)
ALT: 16 U/L (ref 0–35)
AST: 18 U/L (ref 0–37)
Anion gap: 12 (ref 5–15)
BILIRUBIN TOTAL: 1.6 mg/dL — AB (ref 0.3–1.2)
BUN: 11 mg/dL (ref 6–23)
CHLORIDE: 100 mmol/L (ref 96–112)
CO2: 26 mmol/L (ref 19–32)
CREATININE: 0.69 mg/dL (ref 0.50–1.10)
Calcium: 9 mg/dL (ref 8.4–10.5)
GFR calc Af Amer: >90 mL/min (ref >90)
Glucose, Bld: 103 mg/dL — ABNORMAL HIGH (ref 70–99)
Potassium: 3.6 mmol/L (ref 3.5–5.1)
SODIUM: 138 mmol/L (ref 135–145)
Total Protein: 7 g/dL (ref 6.0–8.3)

## 2014-12-06 MED ORDER — HYDROMORPHONE HCL 1 MG/ML IJ SOLN
1.0000 mg | Freq: Once | INTRAMUSCULAR | Status: AC
  Administered 2014-12-06: 1 mg via INTRAVENOUS
  Filled 2014-12-06: qty 1

## 2014-12-06 MED ORDER — OXYCODONE-ACETAMINOPHEN 5-325 MG PO TABS: 2.0000 | ORAL_TABLET | Freq: Four times a day (QID) | ORAL | Status: DC | PRN

## 2014-12-06 MED ORDER — IOHEXOL 300 MG/ML  SOLN
100.0000 mL | Freq: Once | INTRAMUSCULAR | Status: AC | PRN
  Administered 2014-12-06: 100 mL via INTRAVENOUS

## 2014-12-06 MED ORDER — METRONIDAZOLE 500 MG PO TABS: 500.0000 mg | ORAL_TABLET | Freq: Two times a day (BID) | ORAL | Status: DC

## 2014-12-06 MED ORDER — SODIUM CHLORIDE 0.9 % IV SOLN
Freq: Once | INTRAVENOUS | Status: AC
Start: 2014-12-06 — End: 2014-12-06
  Administered 2014-12-06: 21:00:00 via INTRAVENOUS

## 2014-12-06 MED ORDER — ONDANSETRON HCL 4 MG/2ML IJ SOLN
4.0000 mg | Freq: Once | INTRAMUSCULAR | Status: AC
  Administered 2014-12-06: 4 mg via INTRAVENOUS
  Filled 2014-12-06: qty 2

## 2014-12-06 MED ORDER — NONFORMULARY OR COMPOUNDED ITEM: Status: DC

## 2014-12-06 MED ORDER — CIPROFLOXACIN HCL 500 MG PO TABS: 500.0000 mg | ORAL_TABLET | Freq: Two times a day (BID) | ORAL | Status: DC

## 2014-12-06 MED ORDER — ONDANSETRON HCL 4 MG PO TABS: 4.0000 mg | ORAL_TABLET | Freq: Four times a day (QID) | ORAL | Status: DC

## 2014-12-06 NOTE — Progress Notes (Signed)
Patricia [name] 1950/12/26 [id number]        63 y.o.  G2P2002 for annual exam.  Several issues noted below.  Past medical history,surgical history, problem list, medications, allergies, family history and social history were all reviewed and documented as reviewed in the EPIC chart.  ROS:  Performed with pertinent positives and negatives included in the history, assessment and plan.   Additional significant findings :  Diverticulitis symptoms as discussed below.   Exam: Kim assistant Filed Vitals:   12/06/14 0953  BP: 120/70  Height: 5' 5"  (1.651 m)  Weight: 172 lb (78.019 kg)   General appearance:  Normal affect, orientation and appearance. Skin: Grossly normal HEENT: Without gross lesions.  No cervical or supraclavicular adenopathy. Thyroid normal.  Lungs:  Clear without wheezing, rales or rhonchi Cardiac: RR, without RMG Abdominal:  Soft, tender in the left lower quadrant, without masses, guarding, rebound, organomegaly or hernia. Well-healed abdominoplasty scar Breasts:  Examined lying and sitting without masses, retractions, discharge or axillary adenopathy.  Bilateral implants noted Pelvic:  Ext/BUS/vagina with atrophic changes  Adnexa  Without masses or tenderness    Anus and perineum  Normal   Rectovaginal  Normal sphincter tone without palpated masses or tenderness.    Assessment/Plan:  64 y.o. G78P2002 female for annual exam.   1. Postmenopausal/atrophic genital changes. Status post TAH for leiomyoma.Patient transiently tried estradiol vaginal cream last year with good results due to vaginal dryness but discontinued it due to insurance issues. I rediscussed the options for vaginal support and the patient wants to retry the formulated vaginal estrogen cream. [facility]. Twice weekly application. I again reviewed the risks to include absorption with systemic side effects such as stroke heart attack DVT breast cancer.line  diverticulitis.  2. Patient recently diagnosed with diverticulitis and is on Flagyl and ciprofloxacin for several days.  Has lower abdominal tenderness on exam. No acute findings. Is in contact with her primary is treating her and he is arranging for ultrasound and follow up with him. 3. Pap smear 2012. No Pap smear done today. No history of abnormal Pap smears. We've previously discussed current screening guidelines and both agree to stop screening as she is status post hysterectomy for benign indications. 4. Mammography 01/2014. Repeat mammogram this coming April. SBE monthly reviewed. 5. DEXA 2011 normal. Plan repeat DEXA next year. Increase calcium vitamin D reviewed. 6. Colonoscopy 2013. Repeat at their recommended interval. 7. Health maintenance. No routine lab work done as she reports this done at her primary physician's office. Follow up in one year, sooner as needed.     Anastasio Auerbach MD, 11:11 AM 12/06/2014

## 2014-12-06 NOTE — ED Notes (Signed)
Pt on 15 mins med hold, spouse will be driving, to be discharged at or after 2233.

## 2014-12-06 NOTE — ED Notes (Signed)
Pt had CT earlier today which showed diverticulitis. Pt reports LLQ since Sunday, no v/d. Was started on flagyl and cipro a few days ago.

## 2014-12-06 NOTE — Addendum Note (Signed)
Addended by: Joaquin Music on: 12/06/2014 02:05 PM   Modules accepted: Orders

## 2014-12-06 NOTE — Telephone Encounter (Signed)
-----   Message from Anastasio Auerbach, MD sent at 12/06/2014 11:13 AM EST ----- Call in [facility]ill [facility]. Dispense three-month supply to use twice weekly refill 1 year

## 2014-12-06 NOTE — Discharge Instructions (Signed)

## 2014-12-06 NOTE — Telephone Encounter (Signed)
Rx called in 

## 2014-12-06 NOTE — Patient Instructions (Signed)
Retry the vaginal estrogen cream twice weekly. Call me if you have any issues with this.  You may obtain a copy of any labs that were done today by logging onto MyChart as outlined in the instructions provided with your AVS (after visit summary). The office will not call with normal lab results but certainly if there are any significant abnormalities then we will contact you.   Health Maintenance, Female A healthy lifestyle and preventative care can promote health and wellness.  Maintain regular health, dental, and eye exams.  Eat a healthy diet. Foods like vegetables, fruits, whole grains, low-fat dairy products, and lean protein foods contain the nutrients you need without too many calories. Decrease your intake of foods high in solid fats, added sugars, and salt. Get information about a proper diet from your caregiver, if necessary.  Regular physical exercise is one of the most important things you can do for your health. Most adults should get at least 150 minutes of moderate-intensity exercise (any activity that increases your heart rate and causes you to sweat) each week. In addition, most adults need muscle-strengthening exercises on 2 or more days a week.   Maintain a healthy weight. The body mass index (BMI) is a screening tool to identify possible weight problems. It provides an estimate of body fat based on height and weight. Your caregiver can help determine your BMI, and can help you achieve or maintain a healthy weight. For adults 20 years and older:  A BMI below 18.5 is considered underweight.  A BMI of 18.5 to 24.9 is normal.  A BMI of 25 to 29.9 is considered overweight.  A BMI of 30 and above is considered obese.  Maintain normal blood lipids and cholesterol by exercising and minimizing your intake of saturated fat. Eat a balanced diet with plenty of fruits and vegetables. Blood tests for lipids and cholesterol should begin at age 43 and be repeated every 5 years. If your  lipid or cholesterol levels are high, you are over 50, or you are a high risk for heart disease, you may need your cholesterol levels checked more frequently.Ongoing high lipid and cholesterol levels should be treated with medicines if diet and exercise are not effective.  If you smoke, find out from your caregiver how to quit. If you do not use tobacco, do not start.  Lung cancer screening is recommended for adults aged 30 80 years who are at high risk for developing lung cancer because of a history of smoking. Yearly low-dose computed tomography (CT) is recommended for people who have at least a 30-pack-year history of smoking and are a current smoker or have quit within the past 15 years. A pack year of smoking is smoking an average of 1 pack of cigarettes a day for 1 year (for example: 1 pack a day for 30 years or 2 packs a day for 15 years). Yearly screening should continue until the smoker has stopped smoking for at least 15 years. Yearly screening should also be stopped for people who develop a health problem that would prevent them from having lung cancer treatment.  If you are pregnant, do not drink alcohol. If you are breastfeeding, be very cautious about drinking alcohol. If you are not pregnant and choose to drink alcohol, do not exceed 1 drink per day. One drink is considered to be 12 ounces (355 mL) of beer, 5 ounces (148 mL) of wine, or 1.5 ounces (44 mL) of liquor.  Avoid use of street drugs.  Do not share needles with anyone. Ask for help if you need support or instructions about stopping the use of drugs.  High blood pressure causes heart disease and increases the risk of stroke. Blood pressure should be checked at least every 1 to 2 years. Ongoing high blood pressure should be treated with medicines, if weight loss and exercise are not effective.  If you are 91 to 65 years old, ask your caregiver if you should take aspirin to prevent strokes.  Diabetes screening involves taking a  blood sample to check your fasting blood sugar level. This should be done once every 3 years, after age 33, if you are within normal weight and without risk factors for diabetes. Testing should be considered at a younger age or be carried out more frequently if you are overweight and have at least 1 risk factor for diabetes.  Breast cancer screening is essential preventative care for women. You should practice "breast self-awareness." This means understanding the normal appearance and feel of your breasts and may include breast self-examination. Any changes detected, no matter how small, should be reported to a caregiver. Women in their 66s and 30s should have a clinical breast exam (CBE) by a caregiver as part of a regular health exam every 1 to 3 years. After age 50, women should have a CBE every year. Starting at age 31, women should consider having a mammogram (breast X-ray) every year. Women who have a family history of breast cancer should talk to their caregiver about genetic screening. Women at a high risk of breast cancer should talk to their caregiver about having an MRI and a mammogram every year.  Breast cancer gene (BRCA)-related cancer risk assessment is recommended for women who have family members with BRCA-related cancers. BRCA-related cancers include breast, ovarian, tubal, and peritoneal cancers. Having family members with these cancers may be associated with an increased risk for harmful changes (mutations) in the breast cancer genes BRCA1 and BRCA2. Results of the assessment will determine the need for genetic counseling and BRCA1 and BRCA2 testing.  The Pap test is a screening test for cervical cancer. Women should have a Pap test starting at age 58. Between ages 90 and 13, Pap tests should be repeated every 2 years. Beginning at age 17, you should have a Pap test every 3 years as long as the past 3 Pap tests have been normal. If you had a hysterectomy for a problem that was not cancer or  a condition that could lead to cancer, then you no longer need Pap tests. If you are between ages 16 and 9, and you have had normal Pap tests going back 10 years, you no longer need Pap tests. If you have had past treatment for cervical cancer or a condition that could lead to cancer, you need Pap tests and screening for cancer for at least 20 years after your treatment. If Pap tests have been discontinued, risk factors (such as a new sexual partner) need to be reassessed to determine if screening should be resumed. Some women have medical problems that increase the chance of getting cervical cancer. In these cases, your caregiver may recommend more frequent screening and Pap tests.  The human papillomavirus (HPV) test is an additional test that may be used for cervical cancer screening. The HPV test looks for the virus that can cause the cell changes on the cervix. The cells collected during the Pap test can be tested for HPV. The HPV test could be used  to screen women aged 74 years and older, and should be used in women of any age who have unclear Pap test results. After the age of 8, women should have HPV testing at the same frequency as a Pap test.  Colorectal cancer can be detected and often prevented. Most routine colorectal cancer screening begins at the age of 24 and continues through age 10. However, your caregiver may recommend screening at an earlier age if you have risk factors for colon cancer. On a yearly basis, your caregiver may provide home test kits to check for hidden blood in the stool. Use of a small camera at the end of a tube, to directly examine the colon (sigmoidoscopy or colonoscopy), can detect the earliest forms of colorectal cancer. Talk to your caregiver about this at age 35, when routine screening begins. Direct examination of the colon should be repeated every 5 to 10 years through age 31, unless early forms of pre-cancerous polyps or small growths are found.  Hepatitis C  blood testing is recommended for all people born from [birth year] through 1966 and any individual with known risks for hepatitis C.  Practice safe sex. Use condoms and avoid high-risk sexual practices to reduce the spread of sexually transmitted infections (STIs). Sexually active women aged 15 and younger should be checked for Chlamydia, which is a common sexually transmitted infection. Older women with new or multiple partners should also be tested for Chlamydia. Testing for other STIs is recommended if you are sexually active and at increased risk.  Osteoporosis is a disease in which the bones lose minerals and strength with aging. This can result in serious bone fractures. The risk of osteoporosis can be identified using a bone density scan. Women ages 56 and over and women at risk for fractures or osteoporosis should discuss screening with their caregivers. Ask your caregiver whether you should be taking a calcium supplement or vitamin D to reduce the rate of osteoporosis.  Menopause can be associated with physical symptoms and risks. Hormone replacement therapy is available to decrease symptoms and risks. You should talk to your caregiver about whether hormone replacement therapy is right for you.  Use sunscreen. Apply sunscreen liberally and repeatedly throughout the day. You should seek shade when your shadow is shorter than you. Protect yourself by wearing long sleeves, pants, a wide-brimmed hat, and sunglasses year round, whenever you are outdoors.  Notify your caregiver of new moles or changes in moles, especially if there is a change in shape or color. Also notify your caregiver if a mole is larger than the size of a pencil eraser.  Stay current with your immunizations. Document Released: 04/19/2011 Document Revised: 01/29/2013 Document Reviewed: 04/19/2011 [facility] Patient Information 2014 [place].

## 2014-12-06 NOTE — ED Provider Notes (Signed)
CSN: [id number]     Arrival date & time 12/06/14  1757 History   First MD Initiated Contact with Patient 12/06/14 1935     Chief Complaint  Patient presents with  . Diverticulitis     (Consider location/radiation/quality/duration/timing/severity/associated sxs/prior Treatment) HPI Comments: Patient presents to the emergency department with chief complaint of left lower quadrant abdominal pain. Patient states the pain started on Sunday. She states that it has progressively worsened. She reports that she has a history of diverticulitis. Patient states that the pain is currently an 8 out of 10. She states that she had a CAT scan of the abdomen today which showed diverticulitis. She was instructed to come to the emergency department. She is currently taking Cipro and Flagyl for her symptoms. She denies any fevers, chills, nausea, vomiting, or diarrhea. Denies any hematemesis, hematochezia, or melena.  The history is provided by the patient. No language interpreter was used.    Past Medical History  Diagnosis Date  . Reflux   . Atrophic vaginitis   . Aneurysm of aorta   . Depression   . Fatty liver   . Hypertension   . Obesity   . Hyperlipidemia   . Edema of both legs     She takes Lasix a couple of times per week to decrease swelling.    . Memory difficulty   . Shingles   . Diverticulitis   . Arthritis    Past Surgical History  Procedure Laterality Date  . Abdominal hysterectomy    . Cesarean section      X 2  . Knee surgery    . Basal and squamous cell cancers excised    . Augmentation mammaplasty     Family History  Problem Relation Age of Onset  . Uterine cancer Mother   . Heart disease Mother   . Diabetes Mother   . Hypertension Mother   . Hyperlipidemia Mother   . Alzheimer's disease Father   . Hypertension Father   . Cancer Paternal Grandmother     Gallbladder cancer  . Diverticulitis Paternal Grandmother     gallbladder  . Liver cancer Paternal Grandfather    . Diverticulitis Paternal Grandfather   . Breast cancer Cousin     Paternal 1st cousin Age 80  . Depression Sister   . Bipolar disorder Sister   . Diverticulitis Maternal Grandmother    History  Substance Use Topics  . Smoking status: Former Smoker -- 1.00 packs/day for 25 years  . Smokeless tobacco: Never Used  . Alcohol Use: 0.0 oz/week    0 Standard drinks or equivalent per week     Comment: Rare   OB History    Gravida Para Term Preterm AB TAB SAB Ectopic Multiple Living   2 2 2       2      Review of Systems  Constitutional: Negative for fever and chills.  Respiratory: Negative for shortness of breath.   Cardiovascular: Negative for chest pain.  Gastrointestinal: Positive for abdominal pain. Negative for nausea, vomiting, diarrhea and constipation.  Genitourinary: Negative for dysuria.  All other systems reviewed and are negative.     Allergies  Morphine and Tetracyclines & related  Home Medications   Prior to Admission medications   Medication Sig Start Date End Date Taking? Authorizing Provider  ALPRAZolam Duanne Moron) 0.5 MG tablet  01/16/14   Historical Provider, MD  BIOTIN PO Take by mouth. 3 times daily    Historical Provider, MD  NONFORMULARY OR COMPOUNDED  ITEM Estradiol 0.02% vaginal cream place vaginally twice weekly 12/06/14   Anastasio Auerbach, MD  triamterene-hydrochlorothiazide (MAXZIDE-25) 37.5-25 MG per tablet Take 1 tablet by mouth daily.      Historical Provider, MD   BP 133/68 mmHg  Pulse 102  Temp(Src) 97.9 F (36.6 C) (Oral)  Resp 18  SpO2 100% Physical Exam  Constitutional: She is oriented to person, place, and time. She appears well-developed and well-nourished.  HENT:  Head: Normocephalic and atraumatic.  Eyes: Conjunctivae and EOM are normal. Pupils are equal, round, and reactive to light.  Neck: Normal range of motion. Neck supple.  Cardiovascular: Normal rate and regular rhythm.  Exam reveals no gallop and no friction rub.   No murmur  heard. Pulmonary/Chest: Effort normal and breath sounds normal. No respiratory distress. She has no wheezes. She has no rales. She exhibits no tenderness.  Abdominal: Soft. Bowel sounds are normal. She exhibits no distension and no mass. There is no tenderness. There is no rebound and no guarding.  Left lower quadrant moderately tender to palpation, no other focal abdominal tenderness  Musculoskeletal: Normal range of motion. She exhibits no edema or tenderness.  Neurological: She is alert and oriented to person, place, and time.  Skin: Skin is warm and dry.  Psychiatric: She has a normal mood and affect. Her behavior is normal. Judgment and thought content normal.  Nursing note and vitals reviewed.   ED Course  Procedures (including critical care time) Results for orders placed or performed during the hospital encounter of 12/06/14  Comprehensive metabolic panel  Result Value Ref Range   Sodium 138 135 - 145 mmol/L   Potassium 3.6 3.5 - 5.1 mmol/L   Chloride 100 96 - 112 mmol/L   CO2 26 19 - 32 mmol/L   Glucose, Bld 103 (H) 70 - 99 mg/dL   BUN 11 6 - 23 mg/dL   Creatinine, Ser 0.69 0.50 - 1.10 mg/dL   Calcium 9.0 8.4 - 10.5 mg/dL   Total Protein 7.0 6.0 - 8.3 g/dL   Albumin 3.8 3.5 - 5.2 g/dL   AST 18 0 - 37 U/L   ALT 16 0 - 35 U/L   Alkaline Phosphatase 76 39 - 117 U/L   Total Bilirubin 1.6 (H) 0.3 - 1.2 mg/dL   GFR calc non Af Amer >90 >90 mL/min   GFR calc Af Amer >90 >90 mL/min   Anion gap 12 5 - 15  CBC with Differential  Result Value Ref Range   WBC 6.9 4.0 - 10.5 K/uL   RBC 4.49 3.87 - 5.11 MIL/uL   Hemoglobin 13.5 12.0 - 15.0 g/dL   HCT 41.9 36.0 - 46.0 %   MCV 93.3 78.0 - 100.0 fL   MCH 30.1 26.0 - 34.0 pg   MCHC 32.2 30.0 - 36.0 g/dL   RDW 13.2 11.5 - 15.5 %   Platelets 230 150 - 400 K/uL   Neutrophils Relative % 71 43 - 77 %   Neutro Abs 4.8 1.7 - 7.7 K/uL   Lymphocytes Relative 22 12 - 46 %   Lymphs Abs 1.5 0.7 - 4.0 K/uL   Monocytes Relative 6 3 - 12 %    Monocytes Absolute 0.4 0.1 - 1.0 K/uL   Eosinophils Relative 1 0 - 5 %   Eosinophils Absolute 0.1 0.0 - 0.7 K/uL   Basophils Relative 0 0 - 1 %   Basophils Absolute 0.0 0.0 - 0.1 K/uL   Ct Abdomen Pelvis W Contrast  12/06/2014   CLINICAL DATA:  Left lower quadrant pain starting 12/02/2014 possible diverticulitis  EXAM: CT ABDOMEN AND PELVIS WITH CONTRAST  TECHNIQUE: Multidetector CT imaging of the abdomen and pelvis was performed using the standard protocol following bolus administration of intravenous contrast.  CONTRAST:  148m OMNIPAQUE IOHEXOL 300 MG/ML  SOLN  COMPARISON:  CT scan 09/02/2008  FINDINGS: Lung bases are unremarkable. Bilateral breast implants are noted. Sagittal images of the spine shows mild degenerative changes thoracolumbar spine. Enhanced liver, pancreas spleen and adrenal glands are unremarkable. Enhanced kidneys are symmetrical in size. There is a calcified calculus nonobstructive in upper pole of the left kidney measures 8 mm. Cyst in lower pole of the left kidney measures 8 mm.  No hydronephrosis or hydroureter.  Delayed renal images shows bilateral renal symmetrical excretion. Bilateral visualized proximal ureter is unremarkable. Atherosclerotic calcifications of abdominal aorta and iliac arteries. No aortic aneurysm.  There is no small bowel obstruction. No ascites or free air. Small nonspecific lymph nodes are noted in right lower quadrant mesentery the largest in axial image 48 measures 9.5 x 7.7 mm.  The terminal ileum is unremarkable. No pericecal inflammation. No colonic obstruction.  There are scattered colonic diverticula in descending colon. Multiple sigmoid colon diverticula are noted.  In axial image 72 there is segmental thickening of colonic wall in distal sigmoid colon and mild stranding of surrounding fat. This segment of the colon measures about 4.4 cm in length. Findings are consistent with focal colitis or segmental diverticulitis. There is no pericolonic abscess.  No diverticular abscess. No evidence of oral contrast material extravasation or perforation.  The patient is status post hysterectomy.  IMPRESSION: 1. There is short segment thickening of distal sigmoid colon wall. Mild stranding of pericolonic fat. Multiple sigmoid colon diverticula are noted. Findings are consistent with focal colitis or segmental acute diverticulitis. No pericolonic abscess or perforation. 2. Scattered diverticula are noted descending colon. No evidence of acute diverticulitis in descending call. 3. There is left nonobstructive nephrolithiasis. No hydronephrosis or hydroureter. 4. No small bowel obstruction. 5. Status post hysterectomy.   Electronically Signed   By: LLahoma CrockerM.D.   On: 12/06/2014 17:18     Imaging Review Ct Abdomen Pelvis W Contrast  12/06/2014   CLINICAL DATA:  Left lower quadrant pain starting 12/02/2014 possible diverticulitis  EXAM: CT ABDOMEN AND PELVIS WITH CONTRAST  TECHNIQUE: Multidetector CT imaging of the abdomen and pelvis was performed using the standard protocol following bolus administration of intravenous contrast.  CONTRAST:  1040mOMNIPAQUE IOHEXOL 300 MG/ML  SOLN  COMPARISON:  CT scan 09/02/2008  FINDINGS: Lung bases are unremarkable. Bilateral breast implants are noted. Sagittal images of the spine shows mild degenerative changes thoracolumbar spine. Enhanced liver, pancreas spleen and adrenal glands are unremarkable. Enhanced kidneys are symmetrical in size. There is a calcified calculus nonobstructive in upper pole of the left kidney measures 8 mm. Cyst in lower pole of the left kidney measures 8 mm.  No hydronephrosis or hydroureter.  Delayed renal images shows bilateral renal symmetrical excretion. Bilateral visualized proximal ureter is unremarkable. Atherosclerotic calcifications of abdominal aorta and iliac arteries. No aortic aneurysm.  There is no small bowel obstruction. No ascites or free air. Small nonspecific lymph nodes are noted in right  lower quadrant mesentery the largest in axial image 48 measures 9.5 x 7.7 mm.  The terminal ileum is unremarkable. No pericecal inflammation. No colonic obstruction.  There are scattered colonic diverticula in descending colon. Multiple sigmoid colon diverticula are  noted.  In axial image 72 there is segmental thickening of colonic wall in distal sigmoid colon and mild stranding of surrounding fat. This segment of the colon measures about 4.4 cm in length. Findings are consistent with focal colitis or segmental diverticulitis. There is no pericolonic abscess. No diverticular abscess. No evidence of oral contrast material extravasation or perforation.  The patient is status post hysterectomy.  IMPRESSION: 1. There is short segment thickening of distal sigmoid colon wall. Mild stranding of pericolonic fat. Multiple sigmoid colon diverticula are noted. Findings are consistent with focal colitis or segmental acute diverticulitis. No pericolonic abscess or perforation. 2. Scattered diverticula are noted descending colon. No evidence of acute diverticulitis in descending call. 3. There is left nonobstructive nephrolithiasis. No hydronephrosis or hydroureter. 4. No small bowel obstruction. 5. Status post hysterectomy.   Electronically Signed   By: Lahoma Crocker M.D.   On: 12/06/2014 17:18     EKG Interpretation None      MDM   Final diagnoses:  Diverticulitis of intestine without perforation or abscess without bleeding    Patient with acute diverticulitis. Came to the emergency department for pain control. She is currently taking Cipro and Flagyl as directed. We'll treat pain, give fluids, and will reassess.  10:02 PM Patient's pain is improved.  Labs are reassuring.  I feel that the patient can be discharged to home.  She is tolerating oral intake.  Will need to prescribe some pain and nausea meds and lengthen her antibiotic regimen.   Otherwise, think she can be managed at home.  Discussed this plan with  Dr. Tawnya Crook, who agrees with the plan.    Montine Circle, PA-C 12/06/14 2230  Ernestina Patches, MD 12/06/14 2352

## 2015-01-19 ENCOUNTER — Emergency Department (HOSPITAL_BASED_OUTPATIENT_CLINIC_OR_DEPARTMENT_OTHER)
Admission: EM | Admit: 2015-01-19 | Discharge: 2015-01-19 | Disposition: A | Discharge status: Home / Self Care | Payer: 59 | Attending: Emergency Medicine | Admitting: Emergency Medicine

## 2015-01-19 ENCOUNTER — Encounter (HOSPITAL_BASED_OUTPATIENT_CLINIC_OR_DEPARTMENT_OTHER): Payer: Self-pay

## 2015-01-19 DIAGNOSIS — F329 Major depressive disorder, single episode, unspecified: Secondary | ICD-10-CM | POA: Diagnosis not present

## 2015-01-19 DIAGNOSIS — W5501XA Bitten by cat, initial encounter: Secondary | ICD-10-CM | POA: Diagnosis not present

## 2015-01-19 DIAGNOSIS — Y998 Other external cause status: Secondary | ICD-10-CM | POA: Diagnosis not present

## 2015-01-19 DIAGNOSIS — Z8742 Personal history of other diseases of the female genital tract: Secondary | ICD-10-CM | POA: Insufficient documentation

## 2015-01-19 DIAGNOSIS — Z87891 Personal history of nicotine dependence: Secondary | ICD-10-CM | POA: Insufficient documentation

## 2015-01-19 DIAGNOSIS — Y9389 Activity, other specified: Secondary | ICD-10-CM | POA: Diagnosis not present

## 2015-01-19 DIAGNOSIS — M1990 Unspecified osteoarthritis, unspecified site: Secondary | ICD-10-CM | POA: Insufficient documentation

## 2015-01-19 DIAGNOSIS — Z8719 Personal history of other diseases of the digestive system: Secondary | ICD-10-CM | POA: Diagnosis not present

## 2015-01-19 DIAGNOSIS — Z23 Encounter for immunization: Secondary | ICD-10-CM | POA: Insufficient documentation

## 2015-01-19 DIAGNOSIS — Z8619 Personal history of other infectious and parasitic diseases: Secondary | ICD-10-CM | POA: Insufficient documentation

## 2015-01-19 DIAGNOSIS — Y9289 Other specified places as the place of occurrence of the external cause: Secondary | ICD-10-CM | POA: Insufficient documentation

## 2015-01-19 DIAGNOSIS — Z79899 Other long term (current) drug therapy: Secondary | ICD-10-CM | POA: Diagnosis not present

## 2015-01-19 DIAGNOSIS — I10 Essential (primary) hypertension: Secondary | ICD-10-CM | POA: Diagnosis not present

## 2015-01-19 DIAGNOSIS — S51831A Puncture wound without foreign body of right forearm, initial encounter: Secondary | ICD-10-CM | POA: Diagnosis not present

## 2015-01-19 DIAGNOSIS — E669 Obesity, unspecified: Secondary | ICD-10-CM | POA: Insufficient documentation

## 2015-01-19 MED ORDER — AMOXICILLIN-POT CLAVULANATE 875-125 MG PO TABS
1.0000 | ORAL_TABLET | Freq: Once | ORAL | Status: AC
  Administered 2015-01-19: 1 via ORAL
  Filled 2015-01-19: qty 1

## 2015-01-19 MED ORDER — RABIES VACCINE, PCEC IM SUSR
1.0000 mL | Freq: Once | INTRAMUSCULAR | Status: AC
  Administered 2015-01-19: 1 mL via INTRAMUSCULAR
  Filled 2015-01-19: qty 1

## 2015-01-19 MED ORDER — IBUPROFEN 400 MG PO TABS
400.0000 mg | ORAL_TABLET | Freq: Once | ORAL | Status: AC
  Administered 2015-01-19: 400 mg via ORAL
  Filled 2015-01-19: qty 1

## 2015-01-19 MED ORDER — RABIES IMMUNE GLOBULIN 150 UNIT/ML IM INJ
20.0000 [IU]/kg | INJECTION | Freq: Once | INTRAMUSCULAR | Status: AC
  Administered 2015-01-19: 1500 [IU] via INTRAMUSCULAR
  Filled 2015-01-19: qty 10

## 2015-01-19 MED ORDER — TETANUS-DIPHTH-ACELL PERTUSSIS 5-2.5-18.5 LF-MCG/0.5 IM SUSP
0.5000 mL | Freq: Once | INTRAMUSCULAR | Status: AC
  Administered 2015-01-19: 0.5 mL via INTRAMUSCULAR
  Filled 2015-01-19: qty 0.5

## 2015-01-19 MED ORDER — AMOXICILLIN-POT CLAVULANATE 875-125 MG PO TABS: 1.0000 | ORAL_TABLET | Freq: Two times a day (BID) | ORAL | Status: DC

## 2015-01-19 NOTE — Discharge Instructions (Signed)
Animal Bite Go for rabies immunizations on dates listed. Call animal control to attempt to find the stray cat.  Your wounds to be checked in 3 days by a physicianwhen you get your next rabies immunization.. Take the antibiotic as prescribedto take Tylenol or Advil as directed for pain An animal bite can result in a scratch on the skin, deep open cut, puncture of the skin, crush injury, or tearing away of the skin or a body part. Dogs are responsible for most animal bites. Children are bitten more often than adults. An animal bite can range from very mild to more serious. A small bite from your house pet is no cause for alarm. However, some animal bites can become infected or injure a bone or other tissue. You must seek medical care if:  The skin is broken and bleeding does not slow down or stop after 15 minutes.  The puncture is deep and difficult to clean (such as a cat bite).  Pain, warmth, redness, or pus develops around the wound.  The bite is from a stray animal or rodent. There may be a risk of rabies infection.  The bite is from a snake, raccoon, skunk, fox, coyote, or bat. There may be a risk of rabies infection.  The person bitten has a chronic illness such as diabetes, liver disease, or cancer, or the person takes medicine that lowers the immune system.  There is concern about the location and severity of the bite. It is important to clean and protect an animal bite wound right away to prevent infection. Follow these steps:  Clean the wound with plenty of water and soap.  Apply an antibiotic cream.  Apply gentle pressure over the wound with a clean towel or gauze to slow or stop bleeding.  Elevate the affected area above the heart to help stop any bleeding.  Seek medical care. Getting medical care within 8 hours of the animal bite leads to the best possible outcome. DIAGNOSIS  Your caregiver will most likely:  Take a detailed history of the animal and the bite  injury.  Perform a wound exam.  Take your medical history. Blood tests or X-rays may be performed. Sometimes, infected bite wounds are cultured and sent to a lab to identify the infectious bacteria.  TREATMENT  Medical treatment will depend on the location and type of animal bite as well as the patient's medical history. Treatment may include:  Wound care, such as cleaning and flushing the wound with saline solution, bandaging, and elevating the affected area.  Antibiotics.  Tetanus immunization.  Rabies immunization.  Leaving the wound open to heal. This is often done with animal bites, due to the high risk of infection. However, in certain cases, wound closure with stitches, wound adhesive, skin adhesive strips, or staples may be used. Infected bites that are left untreated may require intravenous (IV) antibiotics and surgical treatment in the hospital. [place]  Follow your caregiver's instructions for wound care.  Take all medicines as directed.  If your caregiver prescribes antibiotics, take them as directed. Finish them even if you start to feel better.  Follow up with your caregiver for further exams or immunizations as directed. You may need a tetanus shot if:  You cannot remember when you had your last tetanus shot.  You have never had a tetanus shot.  The injury broke your skin. If you get a tetanus shot, your arm may swell, get red, and feel warm to the touch. This is  common and not a problem. If you need a tetanus shot and you choose not to have one, there is a rare chance of getting tetanus. Sickness from tetanus can be serious. SEEK MEDICAL CARE IF:  You notice warmth, redness, soreness, swelling, pus discharge, or a bad smell coming from the wound.  You have a red line on the skin coming from the wound.  You have a fever, chills, or a general ill feeling.  You have nausea or vomiting.  You have continued or worsening pain.  You have  trouble moving the injured part.  You have other questions or concerns. MAKE SURE YOU:  Understand these instructions.  Will watch your condition.  Will get help right away if you are not doing well or get worse. Document Released: 06/22/2011 Document Revised: 12/27/2011 Document Reviewed: 06/22/2011 [facility] Patient Information 2015 [place], [place]. This information is not intended to replace advice given to you by your health care provider. Make sure you discuss any questions you have with your health care provider.

## 2015-01-19 NOTE — ED Notes (Signed)
Patient here with cat bite to right inner forearm that occurred last pm. Redness noted to same with some bloody drainage, pain to same

## 2015-01-19 NOTE — ED Provider Notes (Signed)
CSN: [id number]     Arrival date & time 01/19/15  1219 History  This chart was scribed for Orlie Dakin, MD by Edison Simon, ED Scribe. This patient was seen in room MH08/MH08 and the patient's care was started at 4:18 PM.    Chief Complaint  Patient presents with  . Animal Bite   The history is provided by the patient. No language interpreter was used.    HPI Comments: Patricia [name] is a 64 y.o. female who presents to the Emergency Department complaining of cat bite to right forearm with onset last night. She states the cat has been a frequent visitor for the past 6 months and she was petting him on her back porch when it bit her. She has petted him before. She does not know where the cat is now and has not been to find an ownder. She states she feels okay, except the wound is red and sore. She states pain is mild. Her last tetanus shot was in April 2010; she was evaluated at an urgent care and referred here since it was not her cat. She denies history of cancer, diabetes, or cardiac problems. She has HTN for which she takes fluid pill. She denies cigarette or drug use. She sometimes uses alcohol.   Past Medical History  Diagnosis Date  . Reflux   . Atrophic vaginitis   . Aneurysm of aorta   . Depression   . Fatty liver   . Hypertension   . Obesity   . Hyperlipidemia   . Edema of both legs     She takes Lasix a couple of times per week to decrease swelling.    . Memory difficulty   . Shingles   . Diverticulitis   . Arthritis    Past Surgical History  Procedure Laterality Date  . Abdominal hysterectomy    . Cesarean section      X 2  . Knee surgery    . Basal and squamous cell cancers excised    . Augmentation mammaplasty     Family History  Problem Relation Age of Onset  . Uterine cancer Mother   . Heart disease Mother   . Diabetes Mother   . Hypertension Mother   . Hyperlipidemia Mother   . Alzheimer's disease Father   . Hypertension Father   . Cancer Paternal  Grandmother     Gallbladder cancer  . Diverticulitis Paternal Grandmother     gallbladder  . Liver cancer Paternal Grandfather   . Diverticulitis Paternal Grandfather   . Breast cancer Cousin     Paternal 1st cousin Age 2  . Depression Sister   . Bipolar disorder Sister   . Diverticulitis Maternal Grandmother    History  Substance Use Topics  . Smoking status: Former Smoker -- 1.00 packs/day for 25 years  . Smokeless tobacco: Never Used  . Alcohol Use: 0.0 oz/week    0 Standard drinks or equivalent per week     Comment: Rare   OB History    Gravida Para Term Preterm AB TAB SAB Ectopic Multiple Living   2 2 2       2      Review of Systems  Constitutional: Negative.   HENT: Negative.   Respiratory: Negative.   Cardiovascular: Negative.   Gastrointestinal: Negative.   Musculoskeletal: Negative.   Skin: Positive for wound.  Neurological: Negative.   Psychiatric/Behavioral: Negative.       Allergies  Morphine and Tetracyclines & related  Home  Medications   Prior to Admission medications   Medication Sig Start Date End Date Taking? Authorizing Provider  ALPRAZolam Duanne Moron) 0.5 MG tablet Take 0.5 mg by mouth as needed for anxiety. For travel 01/16/14   Historical Provider, MD  BIOTIN PO Take 2 tablets by mouth daily. 3 times daily    Historical Provider, MD  glucosamine-chondroitin 500-400 MG tablet Take 1 tablet by mouth daily.    Historical Provider, MD  NONFORMULARY OR COMPOUNDED ITEM Estradiol 0.02% vaginal cream place vaginally twice weekly 12/06/14   Anastasio Auerbach, MD  ondansetron (ZOFRAN) 4 MG tablet Take 1 tablet (4 mg total) by mouth every 6 (six) hours. 12/06/14   Montine Circle, PA-C  oxyCODONE-acetaminophen (PERCOCET/ROXICET) 5-325 MG per tablet Take 2 tablets by mouth every 6 (six) hours as needed for severe pain. 12/06/14   Montine Circle, PA-C  polyethylene glycol (MIRALAX / GLYCOLAX) packet Take 8.4 g by mouth daily.     Historical Provider, MD   triamterene-hydrochlorothiazide (MAXZIDE-25) 37.5-25 MG per tablet Take 1 tablet by mouth daily.      Historical Provider, MD  vitamin C (ASCORBIC ACID) 250 MG tablet Take 250 mg by mouth daily.    Historical Provider, MD   BP 124/67 mmHg  Pulse 76  Temp(Src) 97.4 F (36.3 C) (Oral)  Resp 18  Ht 5' 4"  (1.626 m)  Wt 164 lb (74.39 kg)  BMI 28.14 kg/m2  SpO2 100% Physical Exam  Constitutional: She appears well-developed and well-nourished.  HENT:  Head: Normocephalic and atraumatic.  Eyes: EOM are normal.  Neck: Neck supple. No tracheal deviation present.  Cardiovascular: Normal rate.   Pulmonary/Chest: Effort normal.  Abdominal: She exhibits no distension.  Musculoskeletal: Normal range of motion. She exhibits tenderness. She exhibits no edema.  RIGHT UPPER EXTREMITY NO SWELLING, PUNCTURE WOUND AT THE MIDDLE THIRD OF THE VOLAR FOREARM WITH 5 CM AREA OF SURROUNDING REDNESS AND TENDERNESS. nO RED STREAKS. nEUROVASCULARLY INTACT All other extremities without redness swelling or tenderness neurovascularly intact  Neurological: She is alert. Coordination normal.  Skin: Skin is warm and dry. No rash noted.  Psychiatric: She has a normal mood and affect.  Nursing note and vitals reviewed.   ED Course  Procedures (including critical care time)  DIAGNOSTIC STUDIES: Oxygen Saturation is 100% on room air, normal by my interpretation.    COORDINATION OF CARE: 4:26 PM Discussed treatment plan with patient at beside, the patient agrees with the plan and has no further questions at this time.   Labs Review Labs Reviewed - No data to display  Imaging Review No results found.   EKG Interpretation None     Patient received rabies immunoglobulin and Rabavert initial dose here as well as tetanus immunization. MDM  And prescriptions for Augmentin. Tylenol or Advil for pain. Follow-up arrangements made for RabAvert subsequent doses days 3,7,14 and 28.patient advised to call animal  control Final diagnoses:  None   Diagnosis cat bite of right forearm     I personally performed the services described in this documentation, which was scribed in my presence. The recorded information has been reviewed and considered.  Orlie Dakin, MD 01/19/15 [phone]

## 2015-01-19 NOTE — ED Notes (Signed)
Patient given all instructions regarding follow up for rabies vaccination.  Patient also given RX due to patient states she may be going out of town and not able to make it to scheduled appts.  Patient instructed to call clinic for appointments for rabies vaccinations.  Patient voiced understanding.

## 2015-01-22 ENCOUNTER — Emergency Department (HOSPITAL_COMMUNITY)
Admission: EM | Admit: 2015-01-22 | Discharge: 2015-01-22 | Disposition: A | Discharge status: Home / Self Care | Payer: 59 | Source: Home / Self Care

## 2015-01-22 ENCOUNTER — Encounter (HOSPITAL_COMMUNITY): Payer: Self-pay

## 2015-01-22 DIAGNOSIS — Z203 Contact with and (suspected) exposure to rabies: Secondary | ICD-10-CM

## 2015-01-22 MED ORDER — RABIES VACCINE, PCEC IM SUSR
1.0000 mL | Freq: Once | INTRAMUSCULAR | Status: AC
  Administered 2015-01-22: 1 mL via INTRAMUSCULAR

## 2015-01-22 MED ORDER — RABIES VACCINE, PCEC IM SUSR
INTRAMUSCULAR | Status: AC
  Filled 2015-01-22: qty 1

## 2015-01-22 NOTE — ED Notes (Signed)
Patient here for day #3 immunization in rabies series . Bite wound slightly bruised, no drainage at present

## 2015-01-26 ENCOUNTER — Emergency Department (INDEPENDENT_AMBULATORY_CARE_PROVIDER_SITE_OTHER)
Admission: EM | Admit: 2015-01-26 | Discharge: 2015-01-26 | Disposition: A | Discharge status: Home / Self Care | Payer: 59 | Source: Home / Self Care

## 2015-01-26 ENCOUNTER — Encounter (HOSPITAL_COMMUNITY): Payer: Self-pay | Admitting: Emergency Medicine

## 2015-01-26 DIAGNOSIS — Z203 Contact with and (suspected) exposure to rabies: Secondary | ICD-10-CM

## 2015-01-26 MED ORDER — RABIES VACCINE, PCEC IM SUSR
INTRAMUSCULAR | Status: AC
  Filled 2015-01-26: qty 1

## 2015-01-26 MED ORDER — RABIES VACCINE, PCEC IM SUSR
1.0000 mL | Freq: Once | INTRAMUSCULAR | Status: AC
  Administered 2015-01-26: 1 mL via INTRAMUSCULAR

## 2015-01-26 NOTE — ED Notes (Signed)
Patient presents for 3rd series of rabies vaccine. No new problems. She is on her last day of antibiotics and wound is healing well. Patient verbalized understanding of her vaccine schedule.

## 2015-02-02 ENCOUNTER — Encounter (HOSPITAL_COMMUNITY): Payer: Self-pay | Admitting: *Deleted

## 2015-02-02 ENCOUNTER — Emergency Department (INDEPENDENT_AMBULATORY_CARE_PROVIDER_SITE_OTHER)
Admission: EM | Admit: 2015-02-02 | Discharge: 2015-02-02 | Disposition: A | Discharge status: Home / Self Care | Payer: 59 | Source: Home / Self Care

## 2015-02-02 DIAGNOSIS — Z203 Contact with and (suspected) exposure to rabies: Secondary | ICD-10-CM | POA: Diagnosis not present

## 2015-02-02 MED ORDER — RABIES VACCINE, PCEC IM SUSR
INTRAMUSCULAR | Status: AC
  Filled 2015-02-02: qty 1

## 2015-02-02 MED ORDER — RABIES VACCINE, PCEC IM SUSR
1.0000 mL | Freq: Once | INTRAMUSCULAR | Status: AC
  Administered 2015-02-02: 1 mL via INTRAMUSCULAR

## 2015-02-02 NOTE — Discharge Instructions (Signed)
You are  [facility]

## 2015-02-02 NOTE — ED Notes (Signed)
Pt    Here  For    Last      In  Her  Series          Of  Rabies            Shots     Pt   Verbalizes        No   Symptoms

## 2015-03-26 DIAGNOSIS — Z1231 Encounter for screening mammogram for malignant neoplasm of breast: Secondary | ICD-10-CM | POA: Diagnosis not present

## 2015-03-26 DIAGNOSIS — Z803 Family history of malignant neoplasm of breast: Secondary | ICD-10-CM | POA: Diagnosis not present

## 2015-03-28 DIAGNOSIS — D692 Other nonthrombocytopenic purpura: Secondary | ICD-10-CM | POA: Diagnosis not present

## 2015-03-28 DIAGNOSIS — L821 Other seborrheic keratosis: Secondary | ICD-10-CM | POA: Diagnosis not present

## 2015-03-28 DIAGNOSIS — D1801 Hemangioma of skin and subcutaneous tissue: Secondary | ICD-10-CM | POA: Diagnosis not present

## 2015-03-28 DIAGNOSIS — Z419 Encounter for procedure for purposes other than remedying health state, unspecified: Secondary | ICD-10-CM | POA: Diagnosis not present

## 2015-03-28 DIAGNOSIS — Z85828 Personal history of other malignant neoplasm of skin: Secondary | ICD-10-CM | POA: Diagnosis not present

## 2015-03-28 DIAGNOSIS — L570 Actinic keratosis: Secondary | ICD-10-CM | POA: Diagnosis not present

## 2015-03-28 DIAGNOSIS — L814 Other melanin hyperpigmentation: Secondary | ICD-10-CM | POA: Diagnosis not present

## 2015-04-02 DIAGNOSIS — N63 Unspecified lump in breast: Secondary | ICD-10-CM | POA: Diagnosis not present

## 2015-04-02 DIAGNOSIS — N6011 Diffuse cystic mastopathy of right breast: Secondary | ICD-10-CM | POA: Diagnosis not present

## 2015-04-18 DIAGNOSIS — E782 Mixed hyperlipidemia: Secondary | ICD-10-CM | POA: Diagnosis not present

## 2015-04-28 DIAGNOSIS — H25013 Cortical age-related cataract, bilateral: Secondary | ICD-10-CM | POA: Diagnosis not present

## 2015-04-28 DIAGNOSIS — H2513 Age-related nuclear cataract, bilateral: Secondary | ICD-10-CM | POA: Diagnosis not present

## 2015-05-12 DIAGNOSIS — I10 Essential (primary) hypertension: Secondary | ICD-10-CM | POA: Diagnosis not present

## 2015-05-12 DIAGNOSIS — K219 Gastro-esophageal reflux disease without esophagitis: Secondary | ICD-10-CM | POA: Diagnosis not present

## 2015-05-12 DIAGNOSIS — Z87891 Personal history of nicotine dependence: Secondary | ICD-10-CM | POA: Diagnosis not present

## 2015-05-12 DIAGNOSIS — D1724 Benign lipomatous neoplasm of skin and subcutaneous tissue of left leg: Secondary | ICD-10-CM | POA: Diagnosis not present

## 2015-05-12 DIAGNOSIS — D1723 Benign lipomatous neoplasm of skin and subcutaneous tissue of right leg: Secondary | ICD-10-CM | POA: Diagnosis not present

## 2015-05-12 DIAGNOSIS — I712 Thoracic aortic aneurysm, without rupture: Secondary | ICD-10-CM | POA: Diagnosis not present

## 2015-05-12 DIAGNOSIS — D1720 Benign lipomatous neoplasm of skin and subcutaneous tissue of unspecified limb: Secondary | ICD-10-CM | POA: Diagnosis not present

## 2015-05-12 DIAGNOSIS — D485 Neoplasm of uncertain behavior of skin: Secondary | ICD-10-CM | POA: Diagnosis not present

## 2015-05-12 DIAGNOSIS — D1721 Benign lipomatous neoplasm of skin and subcutaneous tissue of right arm: Secondary | ICD-10-CM | POA: Diagnosis not present

## 2015-05-21 DIAGNOSIS — T8189XD Other complications of procedures, not elsewhere classified, subsequent encounter: Secondary | ICD-10-CM | POA: Diagnosis not present

## 2015-07-06 ENCOUNTER — Encounter (HOSPITAL_BASED_OUTPATIENT_CLINIC_OR_DEPARTMENT_OTHER): Payer: Self-pay

## 2015-07-06 ENCOUNTER — Emergency Department (HOSPITAL_BASED_OUTPATIENT_CLINIC_OR_DEPARTMENT_OTHER)
Admission: EM | Admit: 2015-07-06 | Discharge: 2015-07-06 | Disposition: A | Discharge status: Home / Self Care | Payer: Medicare Other | Attending: Emergency Medicine | Admitting: Emergency Medicine

## 2015-07-06 DIAGNOSIS — Z79899 Other long term (current) drug therapy: Secondary | ICD-10-CM | POA: Insufficient documentation

## 2015-07-06 DIAGNOSIS — H578 Other specified disorders of eye and adnexa: Secondary | ICD-10-CM | POA: Diagnosis present

## 2015-07-06 DIAGNOSIS — E669 Obesity, unspecified: Secondary | ICD-10-CM | POA: Insufficient documentation

## 2015-07-06 DIAGNOSIS — Z8719 Personal history of other diseases of the digestive system: Secondary | ICD-10-CM | POA: Insufficient documentation

## 2015-07-06 DIAGNOSIS — M1990 Unspecified osteoarthritis, unspecified site: Secondary | ICD-10-CM | POA: Insufficient documentation

## 2015-07-06 DIAGNOSIS — I10 Essential (primary) hypertension: Secondary | ICD-10-CM | POA: Diagnosis not present

## 2015-07-06 DIAGNOSIS — Z87891 Personal history of nicotine dependence: Secondary | ICD-10-CM | POA: Diagnosis not present

## 2015-07-06 DIAGNOSIS — Z8619 Personal history of other infectious and parasitic diseases: Secondary | ICD-10-CM | POA: Diagnosis not present

## 2015-07-06 DIAGNOSIS — Z8742 Personal history of other diseases of the female genital tract: Secondary | ICD-10-CM | POA: Diagnosis not present

## 2015-07-06 DIAGNOSIS — H16301 Unspecified interstitial keratitis, right eye: Secondary | ICD-10-CM

## 2015-07-06 DIAGNOSIS — H168 Other keratitis: Secondary | ICD-10-CM | POA: Diagnosis not present

## 2015-07-06 DIAGNOSIS — F329 Major depressive disorder, single episode, unspecified: Secondary | ICD-10-CM | POA: Insufficient documentation

## 2015-07-06 DIAGNOSIS — H169 Unspecified keratitis: Secondary | ICD-10-CM | POA: Diagnosis not present

## 2015-07-06 MED ORDER — MOXIFLOXACIN HCL 0.5 % OP SOLN: 1.0000 [drp] | OPHTHALMIC | Status: DC | PRN

## 2015-07-06 MED ORDER — FLUORESCEIN SODIUM 1 MG OP STRP
1.0000 | ORAL_STRIP | Freq: Once | OPHTHALMIC | Status: AC
  Administered 2015-07-06: 1 via OPHTHALMIC
  Filled 2015-07-06: qty 1

## 2015-07-06 NOTE — ED Notes (Signed)
Pt unable to do visual acuity, does not have contact lenses with her.

## 2015-07-06 NOTE — ED Provider Notes (Signed)
CSN: [id number]     Arrival date & time 07/06/15  1531 History  This chart was scribed for Evelina Bucy, MD by Julien Nordmann, ED Scribe. This patient was seen in room MH03/MH03 and the patient's care was started at 3:47 PM.     Chief Complaint  Patient presents with  . Eye Drainage      Patient is a 64 y.o. female presenting with eye problem. The history is provided by the patient. No language interpreter was used.  Eye Problem Location:  R eye Quality:  Foreign body sensation and tearing Severity:  Moderate Onset quality:  Sudden Duration:  2 days Timing:  Constant Progression:  Unchanged Chronicity:  New Context: contact lenses   Context: not direct trauma   Relieved by:  Nothing Associated symptoms: discharge, foreign body sensation, itching, photophobia and redness   Associated symptoms: no blurred vision and no double vision    HPI Comments: Patricia [name] is a 64 y.o. female who presents to the Emergency Department complaining of sudden onset, gradual worsening right eye redness onset yesterday. She has associated light sensitivity, drainage and discomfort in her right eye. Pt notes she sleeps in her contacts pretty regularly. She states it feels as if there are grains of sand in her eye. Pt has tried using visine to alleviate the symptoms with no relief. Pt denies blurry vision.   Past Medical History  Diagnosis Date  . Reflux   . Atrophic vaginitis   . Aneurysm of aorta   . Depression   . Fatty liver   . Hypertension   . Obesity   . Hyperlipidemia   . Edema of both legs     She takes Lasix a couple of times per week to decrease swelling.    . Memory difficulty   . Shingles   . Diverticulitis   . Arthritis    Past Surgical History  Procedure Laterality Date  . Abdominal hysterectomy    . Cesarean section      X 2  . Knee surgery    . Basal and squamous cell cancers excised    . Augmentation mammaplasty     Family History  Problem Relation Age of  Onset  . Uterine cancer Mother   . Heart disease Mother   . Diabetes Mother   . Hypertension Mother   . Hyperlipidemia Mother   . Alzheimer's disease Father   . Hypertension Father   . Cancer Paternal Grandmother     Gallbladder cancer  . Diverticulitis Paternal Grandmother     gallbladder  . Liver cancer Paternal Grandfather   . Diverticulitis Paternal Grandfather   . Breast cancer Cousin     Paternal 1st cousin Age 32  . Depression Sister   . Bipolar disorder Sister   . Diverticulitis Maternal Grandmother    Social History  Substance Use Topics  . Smoking status: Former Smoker -- 1.00 packs/day for 25 years  . Smokeless tobacco: Never Used  . Alcohol Use: 0.0 oz/week    0 Standard drinks or equivalent per week     Comment: Rare   OB History    Gravida Para Term Preterm AB TAB SAB Ectopic Multiple Living   2 2 2       2      Review of Systems  Constitutional: Negative for fever and chills.  Eyes: Positive for photophobia, discharge, redness and itching. Negative for blurred vision, double vision, pain and visual disturbance.  Respiratory: Negative for shortness of breath.  All other systems reviewed and are negative.     Allergies  Morphine and Tetracyclines & related  Home Medications   Prior to Admission medications   Medication Sig Start Date End Date Taking? Authorizing Provider  ALPRAZolam Duanne Moron) 0.5 MG tablet Take 0.5 mg by mouth as needed for anxiety. For travel 01/16/14   Historical Provider, MD  BIOTIN PO Take 2 tablets by mouth daily. 3 times daily    Historical Provider, MD  glucosamine-chondroitin 500-400 MG tablet Take 1 tablet by mouth daily.    Historical Provider, MD  NONFORMULARY OR COMPOUNDED ITEM Estradiol 0.02% vaginal cream place vaginally twice weekly 12/06/14   Anastasio Auerbach, MD  ondansetron (ZOFRAN) 4 MG tablet Take 1 tablet (4 mg total) by mouth every 6 (six) hours. 12/06/14   Montine Circle, PA-C  oxyCODONE-acetaminophen  (PERCOCET/ROXICET) 5-325 MG per tablet Take 2 tablets by mouth every 6 (six) hours as needed for severe pain. 12/06/14   Montine Circle, PA-C  polyethylene glycol (MIRALAX / GLYCOLAX) packet Take 8.4 g by mouth daily.     Historical Provider, MD  triamterene-hydrochlorothiazide (MAXZIDE-25) 37.5-25 MG per tablet Take 1 tablet by mouth daily.      Historical Provider, MD  vitamin C (ASCORBIC ACID) 250 MG tablet Take 250 mg by mouth daily.    Historical Provider, MD   Triage vitals: BP 130/73 mmHg  Pulse 93  Temp(Src) 98.5 F (36.9 C) (Oral)  Resp 18  SpO2 98% Physical Exam  Constitutional: She is oriented to person, place, and time. She appears well-developed and well-nourished. No distress.  HENT:  Head: Normocephalic and atraumatic.  Mouth/Throat: Oropharynx is clear and moist.  Eyes: EOM are normal. Pupils are equal, round, and reactive to light.  Slit lamp exam:      The right eye shows no corneal ulcer, no foreign body and no fluorescein uptake.       The left eye shows no fluorescein uptake.  3 small stromal infiltrates along 10-11 o'clock rim of eye without fluorescein uptake.  Neck: Normal range of motion. Neck supple.  Cardiovascular: Normal rate and regular rhythm.  Exam reveals no friction rub.   No murmur heard. Pulmonary/Chest: Effort normal and breath sounds normal. No respiratory distress. She has no wheezes. She has no rales.  Abdominal: Soft. She exhibits no distension. There is no tenderness. There is no rebound.  Musculoskeletal: Normal range of motion. She exhibits no edema.  Neurological: She is alert and oriented to person, place, and time.  Skin: She is not diaphoretic.  Nursing note and vitals reviewed.   ED Course  Procedures  DIAGNOSTIC STUDIES: Oxygen Saturation is 98% on RA, normal by my interpretation.  COORDINATION OF CARE:  3:50 PM Discussed treatment plan which includes stain eye with pt at bedside and pt agreed to plan.  Labs Review Labs  Reviewed - No data to display  Imaging Review No results found. I have personally reviewed and evaluated these images and lab results as part of my medical decision-making.   EKG Interpretation None      MDM   Final diagnoses:  Stromal keratitis, right    65 year old female here with right eye redness. Began after sleeping in her contacts. She has not been wearing contacts sent. She has no decrease in her vision but has mild photophobia and some foreign body sensation. Her right eye is diffusely red and the conjunctiva without objective hemorrhage. She has 3 small stromal infiltrates around the perimeter of her cornea around  10 to 11:00. She has no foreseen uptake under slitlamp exam. I'm concerned for a keratitis due to her contact lenses since she has the stromal infiltrates will consult ophthalmology for medication recommendations. When I even did her lid she does have diffuse cobblestoning of the conjunctiva on the back of her upper eyelid.  No fluorescein uptake. Dr. Posey Pronto recommended vigamox.   I personally performed the services described in this documentation, which was scribed in my presence. The recorded information has been reviewed and is accurate.     Evelina Bucy, MD 07/06/15 [phone]

## 2015-07-06 NOTE — ED Notes (Signed)
Patient here with right eye redness, drainage and discomfort x 2 days. Reports that she went to [place] over [place] and not improving with visine. No known trauma

## 2015-07-07 DIAGNOSIS — H16201 Unspecified keratoconjunctivitis, right eye: Secondary | ICD-10-CM | POA: Diagnosis not present

## 2015-07-09 DIAGNOSIS — H16201 Unspecified keratoconjunctivitis, right eye: Secondary | ICD-10-CM | POA: Diagnosis not present

## 2015-08-05 DIAGNOSIS — H25012 Cortical age-related cataract, left eye: Secondary | ICD-10-CM | POA: Diagnosis not present

## 2015-08-05 DIAGNOSIS — H2512 Age-related nuclear cataract, left eye: Secondary | ICD-10-CM | POA: Diagnosis not present

## 2015-08-05 DIAGNOSIS — H25812 Combined forms of age-related cataract, left eye: Secondary | ICD-10-CM | POA: Diagnosis not present

## 2015-08-14 DIAGNOSIS — Z23 Encounter for immunization: Secondary | ICD-10-CM | POA: Diagnosis not present

## 2015-08-14 DIAGNOSIS — M545 Low back pain: Secondary | ICD-10-CM | POA: Diagnosis not present

## 2015-08-19 DIAGNOSIS — L910 Hypertrophic scar: Secondary | ICD-10-CM | POA: Diagnosis not present

## 2015-08-19 DIAGNOSIS — D1724 Benign lipomatous neoplasm of skin and subcutaneous tissue of left leg: Secondary | ICD-10-CM | POA: Diagnosis not present

## 2015-08-25 DIAGNOSIS — R21 Rash and other nonspecific skin eruption: Secondary | ICD-10-CM | POA: Diagnosis not present

## 2015-09-16 DIAGNOSIS — M1711 Unilateral primary osteoarthritis, right knee: Secondary | ICD-10-CM | POA: Diagnosis not present

## 2015-09-16 DIAGNOSIS — M170 Bilateral primary osteoarthritis of knee: Secondary | ICD-10-CM | POA: Diagnosis not present

## 2015-09-16 DIAGNOSIS — M1712 Unilateral primary osteoarthritis, left knee: Secondary | ICD-10-CM | POA: Diagnosis not present

## 2015-10-01 DIAGNOSIS — Z0001 Encounter for general adult medical examination with abnormal findings: Secondary | ICD-10-CM | POA: Diagnosis not present

## 2015-10-01 DIAGNOSIS — M2010 Hallux valgus (acquired), unspecified foot: Secondary | ICD-10-CM | POA: Diagnosis not present

## 2015-10-01 DIAGNOSIS — F322 Major depressive disorder, single episode, severe without psychotic features: Secondary | ICD-10-CM | POA: Diagnosis not present

## 2015-10-01 DIAGNOSIS — N952 Postmenopausal atrophic vaginitis: Secondary | ICD-10-CM | POA: Diagnosis not present

## 2015-10-01 DIAGNOSIS — R413 Other amnesia: Secondary | ICD-10-CM | POA: Diagnosis not present

## 2015-10-01 DIAGNOSIS — J069 Acute upper respiratory infection, unspecified: Secondary | ICD-10-CM | POA: Diagnosis not present

## 2015-10-01 DIAGNOSIS — I719 Aortic aneurysm of unspecified site, without rupture: Secondary | ICD-10-CM | POA: Diagnosis not present

## 2015-10-01 DIAGNOSIS — E782 Mixed hyperlipidemia: Secondary | ICD-10-CM | POA: Diagnosis not present

## 2015-10-01 DIAGNOSIS — K7581 Nonalcoholic steatohepatitis (NASH): Secondary | ICD-10-CM | POA: Diagnosis not present

## 2015-10-01 DIAGNOSIS — I10 Essential (primary) hypertension: Secondary | ICD-10-CM | POA: Diagnosis not present

## 2015-10-01 DIAGNOSIS — K5730 Diverticulosis of large intestine without perforation or abscess without bleeding: Secondary | ICD-10-CM | POA: Diagnosis not present

## 2015-10-01 DIAGNOSIS — K219 Gastro-esophageal reflux disease without esophagitis: Secondary | ICD-10-CM | POA: Diagnosis not present

## 2015-10-08 DIAGNOSIS — H6980 Other specified disorders of Eustachian tube, unspecified ear: Secondary | ICD-10-CM | POA: Diagnosis not present

## 2015-10-09 DIAGNOSIS — Z79899 Other long term (current) drug therapy: Secondary | ICD-10-CM | POA: Diagnosis not present

## 2015-10-09 DIAGNOSIS — Z886 Allergy status to analgesic agent status: Secondary | ICD-10-CM | POA: Diagnosis not present

## 2015-10-09 DIAGNOSIS — Z885 Allergy status to narcotic agent status: Secondary | ICD-10-CM | POA: Diagnosis not present

## 2015-10-09 DIAGNOSIS — Z87891 Personal history of nicotine dependence: Secondary | ICD-10-CM | POA: Diagnosis not present

## 2015-10-09 DIAGNOSIS — E785 Hyperlipidemia, unspecified: Secondary | ICD-10-CM | POA: Diagnosis not present

## 2015-10-09 DIAGNOSIS — I10 Essential (primary) hypertension: Secondary | ICD-10-CM | POA: Diagnosis not present

## 2015-10-09 DIAGNOSIS — D485 Neoplasm of uncertain behavior of skin: Secondary | ICD-10-CM | POA: Diagnosis not present

## 2015-10-09 DIAGNOSIS — Z882 Allergy status to sulfonamides status: Secondary | ICD-10-CM | POA: Diagnosis not present

## 2015-10-09 DIAGNOSIS — D1724 Benign lipomatous neoplasm of skin and subcutaneous tissue of left leg: Secondary | ICD-10-CM | POA: Diagnosis not present

## 2015-10-09 DIAGNOSIS — L905 Scar conditions and fibrosis of skin: Secondary | ICD-10-CM | POA: Diagnosis not present

## 2015-12-03 DIAGNOSIS — Z780 Asymptomatic menopausal state: Secondary | ICD-10-CM | POA: Diagnosis not present

## 2015-12-05 DIAGNOSIS — L570 Actinic keratosis: Secondary | ICD-10-CM | POA: Diagnosis not present

## 2015-12-05 DIAGNOSIS — D692 Other nonthrombocytopenic purpura: Secondary | ICD-10-CM | POA: Diagnosis not present

## 2015-12-05 DIAGNOSIS — L821 Other seborrheic keratosis: Secondary | ICD-10-CM | POA: Diagnosis not present

## 2015-12-05 DIAGNOSIS — L814 Other melanin hyperpigmentation: Secondary | ICD-10-CM | POA: Diagnosis not present

## 2015-12-05 DIAGNOSIS — Z85828 Personal history of other malignant neoplasm of skin: Secondary | ICD-10-CM | POA: Diagnosis not present

## 2015-12-05 DIAGNOSIS — D1801 Hemangioma of skin and subcutaneous tissue: Secondary | ICD-10-CM | POA: Diagnosis not present

## 2015-12-30 DIAGNOSIS — H2511 Age-related nuclear cataract, right eye: Secondary | ICD-10-CM | POA: Diagnosis not present

## 2015-12-30 DIAGNOSIS — H25811 Combined forms of age-related cataract, right eye: Secondary | ICD-10-CM | POA: Diagnosis not present

## 2015-12-30 DIAGNOSIS — H25011 Cortical age-related cataract, right eye: Secondary | ICD-10-CM | POA: Diagnosis not present

## 2016-01-06 DIAGNOSIS — E782 Mixed hyperlipidemia: Secondary | ICD-10-CM | POA: Diagnosis not present

## 2016-01-20 DIAGNOSIS — M1711 Unilateral primary osteoarthritis, right knee: Secondary | ICD-10-CM | POA: Diagnosis not present

## 2016-01-20 DIAGNOSIS — M170 Bilateral primary osteoarthritis of knee: Secondary | ICD-10-CM | POA: Diagnosis not present

## 2016-01-20 DIAGNOSIS — M1712 Unilateral primary osteoarthritis, left knee: Secondary | ICD-10-CM | POA: Diagnosis not present

## 2016-02-11 DIAGNOSIS — L720 Epidermal cyst: Secondary | ICD-10-CM | POA: Diagnosis not present

## 2016-02-11 DIAGNOSIS — D1801 Hemangioma of skin and subcutaneous tissue: Secondary | ICD-10-CM | POA: Diagnosis not present

## 2016-02-11 DIAGNOSIS — L570 Actinic keratosis: Secondary | ICD-10-CM | POA: Diagnosis not present

## 2016-02-11 DIAGNOSIS — Z85828 Personal history of other malignant neoplasm of skin: Secondary | ICD-10-CM | POA: Diagnosis not present

## 2016-03-09 DIAGNOSIS — H43812 Vitreous degeneration, left eye: Secondary | ICD-10-CM | POA: Diagnosis not present

## 2016-03-11 DIAGNOSIS — M170 Bilateral primary osteoarthritis of knee: Secondary | ICD-10-CM | POA: Diagnosis not present

## 2016-03-11 DIAGNOSIS — M1712 Unilateral primary osteoarthritis, left knee: Secondary | ICD-10-CM | POA: Diagnosis not present

## 2016-03-11 DIAGNOSIS — M1711 Unilateral primary osteoarthritis, right knee: Secondary | ICD-10-CM | POA: Diagnosis not present

## 2016-03-17 ENCOUNTER — Encounter: Payer: Self-pay | Admitting: Cardiology

## 2016-03-17 ENCOUNTER — Ambulatory Visit (INDEPENDENT_AMBULATORY_CARE_PROVIDER_SITE_OTHER): Payer: Medicare Other | Admitting: Cardiology

## 2016-03-17 VITALS — BP 118/74 | HR 68 | Ht 64.0 in | Wt 184.8 lb

## 2016-03-17 DIAGNOSIS — M1711 Unilateral primary osteoarthritis, right knee: Secondary | ICD-10-CM | POA: Diagnosis not present

## 2016-03-17 DIAGNOSIS — M170 Bilateral primary osteoarthritis of knee: Secondary | ICD-10-CM | POA: Diagnosis not present

## 2016-03-17 DIAGNOSIS — R011 Cardiac murmur, unspecified: Secondary | ICD-10-CM

## 2016-03-17 DIAGNOSIS — I77810 Thoracic aortic ectasia: Secondary | ICD-10-CM | POA: Diagnosis not present

## 2016-03-17 DIAGNOSIS — M1712 Unilateral primary osteoarthritis, left knee: Secondary | ICD-10-CM | POA: Diagnosis not present

## 2016-03-17 DIAGNOSIS — I10 Essential (primary) hypertension: Secondary | ICD-10-CM | POA: Diagnosis not present

## 2016-03-17 DIAGNOSIS — E785 Hyperlipidemia, unspecified: Secondary | ICD-10-CM

## 2016-03-17 DIAGNOSIS — R002 Palpitations: Secondary | ICD-10-CM

## 2016-03-17 NOTE — Progress Notes (Signed)
Cardiology Office Note    Date:  03/17/2016   ID:  DESHANNON SEIDE, DOB September 26, 1951, MRN [id number]  PCP:  [name], MD  Cardiologist:   [name], MD     History of Present Illness:  Patricia [name] is a 65 y.o. female here for the evaluation of thoracic aortic dilation. It is been close to 3 years since our last follow-up. LDL cholesterol also elevated at 185. She has not been taking simvastatin different points. 4.1 cm ascending aorta 06/14/13 aortic root was 4.3 cm.  She feels some dyspnea on exertion with stairs for instance which is been quite chronic for her. She also feels a fluttery like sensation in her chest which originally she described as chest pressure but tends secondarily described as a bird fluttering in her chest. This lasted a few seconds duration, relieved without any other interventions. She can go weeks upon weeks without this sensation and then all of a sudden it will come out of nowhere. This does not cause any syncope or dizziness.  Spring 2016 - Rabies shots.  Right foot surgery - open wound, Lipoma     Past Medical History  Diagnosis Date  . Reflux   . Atrophic vaginitis   . Aneurysm of aorta (HCC)   . Depression   . Fatty liver   . Hypertension   . Obesity   . Hyperlipidemia   . Edema of both legs     She takes Lasix a couple of times per week to decrease swelling.    . Memory difficulty   . Shingles   . Diverticulitis   . Arthritis     Past Surgical History  Procedure Laterality Date  . Abdominal hysterectomy    . Cesarean section      X 2  . Knee surgery    . Basal and squamous cell cancers excised    . Augmentation mammaplasty      Current Medications: Outpatient Prescriptions Prior to Visit  Medication Sig Dispense Refill  . ALPRAZolam (XANAX) 0.5 MG tablet Take 0.5 mg by mouth as needed for anxiety. For travel    . BIOTIN PO Take 2 tablets by mouth daily. 3 times daily    . polyethylene glycol (MIRALAX /  GLYCOLAX) packet Take 8.4 g by mouth daily.     Marland Kitchen triamterene-hydrochlorothiazide (MAXZIDE-25) 37.5-25 MG per tablet Take 1 tablet by mouth daily.      Marland Kitchen glucosamine-chondroitin 500-400 MG tablet Take 1 tablet by mouth daily.    Marland Kitchen moxifloxacin (VIGAMOX) 0.5 % ophthalmic solution Place 1 drop into the right eye every hour as needed. Please place one drop hourly while awake until you see the eye doctor. 3 mL 0  . NONFORMULARY OR COMPOUNDED ITEM Estradiol 0.02% vaginal cream place vaginally twice weekly 90 each 3  . ondansetron (ZOFRAN) 4 MG tablet Take 1 tablet (4 mg total) by mouth every 6 (six) hours. 12 tablet 0  . oxyCODONE-acetaminophen (PERCOCET/ROXICET) 5-325 MG per tablet Take 2 tablets by mouth every 6 (six) hours as needed for severe pain. 15 tablet 0  . vitamin C (ASCORBIC ACID) 250 MG tablet Take 250 mg by mouth daily.     No facility-administered medications prior to visit.     Allergies:   Morphine and Tetracyclines & related   Social History   Social History  . Marital Status: Married    Spouse Name: [name]  . Number of Children: 2  . Years of Education: 12   Occupational  History  . EVENT PLANNER      SELF-EMPLOYED    Social History Main Topics  . Smoking status: Former Smoker -- 1.00 packs/day for 25 years  . Smokeless tobacco: Never Used  . Alcohol Use: 0.0 oz/week    0 Standard drinks or equivalent per week     Comment: Rare  . Drug Use: No  . Sexual Activity: Yes    Birth Control/ Protection: Surgical     Comment: HYST-1st intercourse 65 yo-Fewer than 5 partners   Other Topics Concern  . None   Social History Narrative   Marital Status: Married Engineer, technical sales)   Children:  Sons (2)    Pets: Cat    Living Situation: Lives with husband    Occupation: Therapist, occupational - [employer]   Education: Programmer, systems    Tobacco Use/Exposure:  None    Alcohol Use:  Occasional   Drug Use:  None   Diet:  Regular   Exercise:  Bikram Yoga     Hobbies: Gardening/ Reading   Patient is right handed                 Family History:  The patient's family history includes Alzheimer's disease in her father; Bipolar disorder in her sister; Breast cancer in her cousin; Cancer in her paternal grandmother; Depression in her sister; Diabetes in her mother; Diverticulitis in her maternal grandmother, paternal grandfather, and paternal grandmother; Heart disease in her mother; Hyperlipidemia in her mother; Hypertension in her father and mother; Liver cancer in her paternal grandfather; Uterine cancer in her mother.   ROS:   Please see the history of present illness.    ROS All other systems reviewed and are negative.   PHYSICAL EXAM:   VS:  BP 118/74 mmHg  Pulse 68  Ht 5' 4"  (1.626 m)  Wt 184 lb 12.8 oz (83.825 kg)  BMI 31.71 kg/m2   GEN: Well nourished, well developed, in no acute distress HEENT: normal Neck: no JVD, carotid bruits, or masses Cardiac: RRR; 2/6 systolic right upper sternal border murmur, no rubs, or gallops, trace lower extremity edema  Respiratory:  clear to auscultation bilaterally, normal work of breathing GI: soft, nontender, nondistended, + BS MS: no deformity or atrophy Skin: warm and dry, no rash Neuro:  Alert and Oriented x 3, Strength and sensation are intact Psych: euthymic mood, full affect  Wt Readings from Last 3 Encounters:  03/17/16 184 lb 12.8 oz (83.825 kg)  01/19/15 164 lb (74.39 kg)  12/06/14 172 lb (78.019 kg)      Studies/Labs Reviewed:   EKG:  EKG is ordered today.  03/17/16-sinus rhythm, 68, no other abnormalities. Personally viewed  Recent Labs: No results found for requested labs within last 365 days.   Lipid Panel No results found for: CHOL, TRIG, HDL, CHOLHDL, VLDL, LDLCALC, LDLDIRECT  Additional studies/ records that were reviewed today include:  Prior office notes reviewed.  Stress test, low risk 7 minutes 2010  Echocardiogram reassuring EF. Last aortic root 4.3  cm.      ASSESSMENT:    1. Dilated aortic root ([place])   2. Heart murmur   3. Essential hypertension   4. Hyperlipidemia   5. Palpitations      PLAN:  In order of problems listed above:  Dilated aortic root  - Prior 4.3 cm. We will monitor. Blood pressure under good control. No symptoms.  Heart murmur  - We will evaluate with echocardiogram. Possible aortic murmur.  Essential hypertension  - Triamterene hydrochlorothiazide. Stable.  Dependent edema  - Notices after plane flight.  - Encourage leg elevation, calf exercises, compression hose.  Hyperlipidemia  - Continue with simvastatin. LDL 185 off of medication. Discussed merits of statin use.  Palpitations  - Fluttering in her chest, periodic, lasting a few seconds, described as chest pressure but actually feels like a bird fluttering in her chest. This is likely PA-C/PAT. No high-risk symptoms such as syncope associated with this. Continue with exercise. EKG reassuring. Prior exercise treadmill test reassuring. If symptoms worsen or become more worrisome, we could consider monitoring her to exclude the possibility of atrial fibrillation.    Medication Adjustments/Labs and Tests Ordered: Current medicines are reviewed at length with the patient today.  Concerns regarding medicines are outlined above.  Medication changes, Labs and Tests ordered today are listed in the Patient Instructions below. Patient Instructions  Medication Instructions:  Same-no changes  Labwork: None  Testing/Procedures: Your physician has requested that you have an echocardiogram. Echocardiography is a painless test that uses sound waves to create images of your heart. It provides your doctor with information about the size and shape of your heart and how well your heart's chambers and valves are working. This procedure takes approximately one hour. There are no restrictions for this procedure.   Follow-Up: Your physician wants you to  follow-up in: 1 year. You will receive a reminder letter in the mail two months in advance. If you don't receive a letter, please call our office to schedule the follow-up appointment.     If you need a refill on your cardiac medications before your next appointment, please call your pharmacy.       Signed, [name], MD  03/17/2016 12:16 PM    [address], [place], [place]  [address] Phone: [phone]; Fax: [phone]

## 2016-03-17 NOTE — Patient Instructions (Signed)
Medication Instructions:  Same-no changes  Labwork: None  Testing/Procedures: Your physician has requested that you have an echocardiogram. Echocardiography is a painless test that uses sound waves to create images of your heart. It provides your doctor with information about the size and shape of your heart and how well your heart's chambers and valves are working. This procedure takes approximately one hour. There are no restrictions for this procedure.   Follow-Up: Your physician wants you to follow-up in: 1 year. You will receive a reminder letter in the mail two months in advance. If you don't receive a letter, please call our office to schedule the follow-up appointment.     If you need a refill on your cardiac medications before your next appointment, please call your pharmacy.

## 2016-03-19 DIAGNOSIS — R51 Headache: Secondary | ICD-10-CM | POA: Diagnosis not present

## 2016-03-24 ENCOUNTER — Encounter: Payer: Self-pay | Admitting: Cardiology

## 2016-03-24 DIAGNOSIS — M1712 Unilateral primary osteoarthritis, left knee: Secondary | ICD-10-CM | POA: Diagnosis not present

## 2016-03-24 DIAGNOSIS — M1711 Unilateral primary osteoarthritis, right knee: Secondary | ICD-10-CM | POA: Diagnosis not present

## 2016-03-24 DIAGNOSIS — M170 Bilateral primary osteoarthritis of knee: Secondary | ICD-10-CM | POA: Diagnosis not present

## 2016-04-02 ENCOUNTER — Encounter: Payer: Self-pay | Admitting: Gynecology

## 2016-04-02 DIAGNOSIS — Z803 Family history of malignant neoplasm of breast: Secondary | ICD-10-CM | POA: Diagnosis not present

## 2016-04-02 DIAGNOSIS — Z1231 Encounter for screening mammogram for malignant neoplasm of breast: Secondary | ICD-10-CM | POA: Diagnosis not present

## 2016-04-05 ENCOUNTER — Ambulatory Visit (HOSPITAL_COMMUNITY): Payer: Medicare Other | Attending: Internal Medicine

## 2016-04-05 DIAGNOSIS — I77810 Thoracic aortic ectasia: Secondary | ICD-10-CM | POA: Diagnosis not present

## 2016-04-05 DIAGNOSIS — Z8249 Family history of ischemic heart disease and other diseases of the circulatory system: Secondary | ICD-10-CM | POA: Insufficient documentation

## 2016-04-05 DIAGNOSIS — Z87891 Personal history of nicotine dependence: Secondary | ICD-10-CM | POA: Insufficient documentation

## 2016-04-05 DIAGNOSIS — I359 Nonrheumatic aortic valve disorder, unspecified: Secondary | ICD-10-CM

## 2016-04-05 DIAGNOSIS — I340 Nonrheumatic mitral (valve) insufficiency: Secondary | ICD-10-CM | POA: Diagnosis not present

## 2016-04-05 DIAGNOSIS — I10 Essential (primary) hypertension: Secondary | ICD-10-CM | POA: Insufficient documentation

## 2016-04-06 DIAGNOSIS — N6001 Solitary cyst of right breast: Secondary | ICD-10-CM | POA: Diagnosis not present

## 2016-04-22 DIAGNOSIS — R5383 Other fatigue: Secondary | ICD-10-CM | POA: Diagnosis not present

## 2016-04-22 DIAGNOSIS — I10 Essential (primary) hypertension: Secondary | ICD-10-CM | POA: Diagnosis not present

## 2016-04-22 DIAGNOSIS — Z6832 Body mass index (BMI) 32.0-32.9, adult: Secondary | ICD-10-CM | POA: Diagnosis not present

## 2016-04-22 DIAGNOSIS — E669 Obesity, unspecified: Secondary | ICD-10-CM | POA: Diagnosis not present

## 2016-04-23 DIAGNOSIS — E782 Mixed hyperlipidemia: Secondary | ICD-10-CM | POA: Diagnosis not present

## 2016-04-23 DIAGNOSIS — E669 Obesity, unspecified: Secondary | ICD-10-CM | POA: Diagnosis not present

## 2016-04-23 DIAGNOSIS — K7581 Nonalcoholic steatohepatitis (NASH): Secondary | ICD-10-CM | POA: Diagnosis not present

## 2016-04-23 DIAGNOSIS — R51 Headache: Secondary | ICD-10-CM | POA: Diagnosis not present

## 2016-04-23 DIAGNOSIS — I10 Essential (primary) hypertension: Secondary | ICD-10-CM | POA: Diagnosis not present

## 2016-05-05 DIAGNOSIS — M1711 Unilateral primary osteoarthritis, right knee: Secondary | ICD-10-CM | POA: Diagnosis not present

## 2016-05-05 DIAGNOSIS — M170 Bilateral primary osteoarthritis of knee: Secondary | ICD-10-CM | POA: Diagnosis not present

## 2016-05-05 DIAGNOSIS — M1712 Unilateral primary osteoarthritis, left knee: Secondary | ICD-10-CM | POA: Diagnosis not present

## 2016-05-21 DIAGNOSIS — M25561 Pain in right knee: Secondary | ICD-10-CM | POA: Diagnosis not present

## 2016-05-21 DIAGNOSIS — M170 Bilateral primary osteoarthritis of knee: Secondary | ICD-10-CM | POA: Diagnosis not present

## 2016-06-29 ENCOUNTER — Other Ambulatory Visit: Payer: Self-pay | Admitting: Family Medicine

## 2016-06-29 DIAGNOSIS — Z23 Encounter for immunization: Secondary | ICD-10-CM | POA: Diagnosis not present

## 2016-06-29 DIAGNOSIS — L659 Nonscarring hair loss, unspecified: Secondary | ICD-10-CM | POA: Diagnosis not present

## 2016-06-29 DIAGNOSIS — R609 Edema, unspecified: Secondary | ICD-10-CM

## 2016-06-29 DIAGNOSIS — M7989 Other specified soft tissue disorders: Secondary | ICD-10-CM | POA: Diagnosis not present

## 2016-06-29 DIAGNOSIS — G43909 Migraine, unspecified, not intractable, without status migrainosus: Secondary | ICD-10-CM | POA: Diagnosis not present

## 2016-07-02 ENCOUNTER — Ambulatory Visit
Admission: RE | Admit: 2016-07-02 | Discharge: 2016-07-02 | Disposition: A | Discharge status: Home / Self Care | Payer: Medicare Other | Source: Ambulatory Visit | Attending: Family Medicine | Admitting: Family Medicine

## 2016-07-02 DIAGNOSIS — M170 Bilateral primary osteoarthritis of knee: Secondary | ICD-10-CM | POA: Diagnosis not present

## 2016-07-02 DIAGNOSIS — M25561 Pain in right knee: Secondary | ICD-10-CM | POA: Diagnosis not present

## 2016-07-02 DIAGNOSIS — M7989 Other specified soft tissue disorders: Secondary | ICD-10-CM | POA: Diagnosis not present

## 2016-07-02 DIAGNOSIS — R609 Edema, unspecified: Secondary | ICD-10-CM

## 2016-07-06 DIAGNOSIS — R51 Headache: Secondary | ICD-10-CM | POA: Diagnosis not present

## 2016-07-14 DIAGNOSIS — R51 Headache: Secondary | ICD-10-CM | POA: Diagnosis not present

## 2016-08-02 ENCOUNTER — Emergency Department
Admission: EM | Admit: 2016-08-02 | Discharge: 2016-08-02 | Disposition: A | Discharge status: Home / Self Care | Payer: Medicare Other | Attending: Emergency Medicine | Admitting: Emergency Medicine

## 2016-08-02 DIAGNOSIS — T7840XA Allergy, unspecified, initial encounter: Secondary | ICD-10-CM

## 2016-08-02 DIAGNOSIS — L509 Urticaria, unspecified: Secondary | ICD-10-CM

## 2016-08-02 DIAGNOSIS — I10 Essential (primary) hypertension: Secondary | ICD-10-CM | POA: Insufficient documentation

## 2016-08-02 DIAGNOSIS — Z79899 Other long term (current) drug therapy: Secondary | ICD-10-CM | POA: Diagnosis not present

## 2016-08-02 DIAGNOSIS — Z87891 Personal history of nicotine dependence: Secondary | ICD-10-CM | POA: Diagnosis not present

## 2016-08-02 DIAGNOSIS — L500 Allergic urticaria: Secondary | ICD-10-CM | POA: Diagnosis not present

## 2016-08-02 DIAGNOSIS — R21 Rash and other nonspecific skin eruption: Secondary | ICD-10-CM | POA: Diagnosis present

## 2016-08-02 MED ORDER — FAMOTIDINE 20 MG PO TABS
40.0000 mg | ORAL_TABLET | Freq: Once | ORAL | Status: AC
  Administered 2016-08-02: 40 mg via ORAL
  Filled 2016-08-02: qty 2

## 2016-08-02 MED ORDER — METHYLPREDNISOLONE SODIUM SUCC 125 MG IJ SOLR
125.0000 mg | Freq: Once | INTRAMUSCULAR | Status: AC
  Administered 2016-08-02: 125 mg via INTRAMUSCULAR
  Filled 2016-08-02: qty 2

## 2016-08-02 MED ORDER — ACETAMINOPHEN 325 MG PO TABS
650.0000 mg | ORAL_TABLET | Freq: Once | ORAL | Status: AC
  Administered 2016-08-02: 650 mg via ORAL
  Filled 2016-08-02: qty 2

## 2016-08-02 MED ORDER — DIPHENHYDRAMINE HCL 50 MG/ML IJ SOLN
50.0000 mg | Freq: Once | INTRAMUSCULAR | Status: AC
  Administered 2016-08-02: 50 mg via INTRAMUSCULAR
  Filled 2016-08-02: qty 1

## 2016-08-02 MED ORDER — PREDNISONE 50 MG PO TABS: 50.0000 mg | ORAL_TABLET | Freq: Every day | ORAL | 0 refills | Status: DC

## 2016-08-02 MED ORDER — IBUPROFEN 800 MG PO TABS
800.0000 mg | ORAL_TABLET | Freq: Once | ORAL | Status: AC
  Administered 2016-08-02: 800 mg via ORAL
  Filled 2016-08-02: qty 1

## 2016-08-02 MED ORDER — EPINEPHRINE 0.3 MG/0.3ML IJ SOAJ: 0.3000 mg | Freq: Once | INTRAMUSCULAR | 0 refills | Status: AC

## 2016-08-02 MED ORDER — FAMOTIDINE 20 MG PO TABS: 20.0000 mg | ORAL_TABLET | Freq: Two times a day (BID) | ORAL | 0 refills | Status: DC

## 2016-08-02 NOTE — ED Notes (Signed)
Pt c/o pain going down her right arm after the Solu-Medrol and Benadryl administration, gave here an ice bag for comfort.

## 2016-08-02 NOTE — ED Triage Notes (Signed)
Patient ambulatory to triage with steady gait, without difficulty or distress noted; pt reports generalized itching to possible sudafed; took 1 benadryl PTA (but had expired)

## 2016-08-02 NOTE — ED Provider Notes (Signed)
[facility] Emergency Department Provider Note  ____________________________________________  Time seen: Approximately 9:44 PM  I have reviewed the triage vital signs and the nursing notes.   HISTORY  Chief Complaint Allergic Reaction    HPI Patricia [name] is a 66 y.o. female who presents emergency department complaining of allergic reaction. Patient states that she has not taken any new foods or medications to her knowledge. She states that she has been using Sudafed over the weekend for cold-like symptoms but states that she has taken this in the past with no cough medications. Patient denies any new soaps, shampoos, laundry detergents. She states that the symptoms began with a rash in the groin/upper leg region. Patient states that progressed "along the panty line". Then patient states that he began to spread to abdomen, forearms, and 2 spots on her face. Patient denies any difficulty breathing or swallowing. She has no history of allergic reaction in the past. Patient states that she did take 25 mg of expired Benadryl with no change in her symptoms.   Past Medical History:  Diagnosis Date  . Aneurysm of aorta (HCC)   . Arthritis   . Atrophic vaginitis   . Depression   . Diverticulitis   . Edema of both legs    She takes Lasix a couple of times per week to decrease swelling.    . Fatty liver   . Hyperlipidemia   . Hypertension   . Memory difficulty   . Obesity   . Reflux   . Shingles     Patient Active Problem List   Diagnosis Date Noted  . Essential hypertension, benign 02/26/2013  . Obesity, unspecified 02/26/2013  . Aneurysm of aorta (HCC)   . Depression   . Fatty liver   . Reflux   . Atrophic vaginitis   . DYSPNEA 06/04/2009  . HYPERLIPIDEMIA 06/03/2009  . HYPERTENSION 06/03/2009  . MITRAL VALVE PROLAPSE 06/03/2009  . ESOPHAGEAL REFLUX 06/03/2009    Past Surgical History:  Procedure Laterality Date  . ABDOMINAL HYSTERECTOMY     . AUGMENTATION MAMMAPLASTY    . Basal and Squamous cell cancers excised    . CESAREAN SECTION     X 2  . KNEE SURGERY      Prior to Admission medications   Medication Sig Start Date End Date Taking? Authorizing Provider  ALPRAZolam Duanne Moron) 0.5 MG tablet Take 0.5 mg by mouth as needed for anxiety. For travel 01/16/14   Historical Provider, MD  BIOTIN PO Take 2 tablets by mouth daily. 3 times daily    Historical Provider, MD  EPINEPHrine 0.3 mg/0.3 mL IJ SOAJ injection Inject 0.3 mLs (0.3 mg total) into the muscle once. 08/02/16 08/02/16  Roderic Palau D Doniqua Saxby, PA-C  famotidine (PEPCID) 20 MG tablet Take 1 tablet (20 mg total) by mouth 2 (two) times daily. 08/02/16 08/02/17  Roderic Palau D Lyfe Reihl, PA-C  polyethylene glycol (MIRALAX / GLYCOLAX) packet Take 8.4 g by mouth daily.     Historical Provider, MD  predniSONE (DELTASONE) 50 MG tablet Take 1 tablet (50 mg total) by mouth daily with breakfast. 08/02/16   Charline Bills Nadya Hopwood, PA-C  triamterene-hydrochlorothiazide (MAXZIDE-25) 37.5-25 MG per tablet Take 1 tablet by mouth daily.      Historical Provider, MD    Allergies Morphine and Tetracyclines & related  Family History  Problem Relation Age of Onset  . Uterine cancer Mother   . Heart disease Mother   . Diabetes Mother   . Hypertension Mother   .  Hyperlipidemia Mother   . Alzheimer's disease Father   . Hypertension Father   . Cancer Paternal Grandmother     Gallbladder cancer  . Diverticulitis Paternal Grandmother     gallbladder  . Liver cancer Paternal Grandfather   . Diverticulitis Paternal Grandfather   . Breast cancer Cousin     Paternal 1st cousin Age 84  . Depression Sister   . Bipolar disorder Sister   . Diverticulitis Maternal Grandmother     Social History Social History  Substance Use Topics  . Smoking status: Former Smoker    Packs/day: 1.00    Years: 25.00  . Smokeless tobacco: Never Used  . Alcohol use 0.0 oz/week     Comment: Rare     Review of  Systems  Constitutional: No fever/chills Eyes: No visual changes. No discharge ENT: No upper respiratory complaints. Cardiovascular: no chest pain. Respiratory: no cough. No SOB. Gastrointestinal: No abdominal pain.  No nausea, no vomiting.  No diarrhea.  No constipation. Musculoskeletal: Negative for musculoskeletal pain. Skin: Positive for urticaria to groin, abdomen, bilateral forearms, face. Neurological: Negative for headaches, focal weakness or numbness. 10-point ROS otherwise negative.  ____________________________________________   PHYSICAL EXAM:  VITAL SIGNS: ED Triage Vitals  Enc Vitals Group     BP 08/02/16 2132 (!) 147/86     Pulse Rate 08/02/16 2132 (!) 115     Resp 08/02/16 2132 18     Temp 08/02/16 2132 97.6 F (36.4 C)     Temp Source 08/02/16 2132 Oral     SpO2 08/02/16 2132 96 %     Weight 08/02/16 2129 180 lb (81.6 kg)     Height 08/02/16 2129 5' 4"  (1.626 m)     Head Circumference --      Peak Flow --      Pain Score --      Pain Loc --      Pain Edu? --      Excl. in [place]? --      Constitutional: Alert and oriented. Well appearing and in no acute distress. Eyes: Conjunctivae are normal. PERRL. EOMI. Head: Atraumatic. ENT:      Ears:       Nose: No congestion/rhinnorhea.      Mouth/Throat: Mucous membranes are moist. Oropharynx is nonerythematous and nonedematous. A single hive is noted to left lower lip. No angioedema. Neck: No stridor.    Cardiovascular: Normal rate, regular rhythm. Normal S1 and S2.  Good peripheral circulation. Respiratory: Normal respiratory effort without tachypnea or retractions. Lungs CTAB. Good air entry to the bases with no decreased or absent breath sounds. Musculoskeletal: Full range of motion to all extremities. No gross deformities appreciated. Neurologic:  Normal speech and language. No gross focal neurologic deficits are appreciated.  Skin:  Skin is warm, dry and intact. Scattered hives are noted to groin, abdomen, a  lot of forearms, and 2 spots in the face. Minor excoriations to the groin from scratching. Psychiatric: Mood and affect are normal. Speech and behavior are normal. Patient exhibits appropriate insight and judgement.   ____________________________________________   LABS (all labs ordered are listed, but only abnormal results are displayed)  Labs Reviewed - No data to display ____________________________________________  EKG   ____________________________________________  RADIOLOGY   No results found.  ____________________________________________    PROCEDURES  Procedure(s) performed:    Procedures    Medications  diphenhydrAMINE (BENADRYL) injection 50 mg (50 mg Intramuscular Given 08/02/16 2151)  methylPREDNISolone sodium succinate (SOLU-MEDROL) 125 mg/2 mL injection  125 mg (125 mg Intramuscular Given 08/02/16 2151)  famotidine (PEPCID) tablet 40 mg (40 mg Oral Given 08/02/16 2150)  acetaminophen (TYLENOL) tablet 650 mg (650 mg Oral Given 08/02/16 2244)  ibuprofen (ADVIL,MOTRIN) tablet 800 mg (800 mg Oral Given 08/02/16 2244)     ____________________________________________   INITIAL IMPRESSION / ASSESSMENT AND PLAN / ED COURSE  Pertinent labs & imaging results that were available during my care of the patient were reviewed by me and considered in my medical decision making (see chart for details).  Review of the [place] CSRS was performed in accordance of the [place] prior to dispensing any controlled drugs.  Clinical Course    Patient's diagnosis is consistent with Hives from allergic reaction. This is of unknown origin. Patient was given injection of steroids, Benadryl and oral famotidine. Patient's symptoms did improve. Patient was complaining of pain to injection site and was given ice pack, Tylenol, Motrin which did improve the symptoms.. Patient will be discharged home with prescriptions for steroids, famotidine, EpiPen. Patient is advised to discuss EpiPen use  with her primary care provider as well. Patient is given instructions to continue use of Benadryl until symptoms have resolved.. Patient is to follow up with primary care as needed or otherwise directed. Patient is given ED precautions to return to the ED for any worsening or new symptoms.     ____________________________________________  FINAL CLINICAL IMPRESSION(S) / ED DIAGNOSES  Final diagnoses:  Allergic reaction, initial encounter  Hives      NEW MEDICATIONS STARTED DURING THIS VISIT:  New Prescriptions   EPINEPHRINE 0.3 MG/0.3 ML IJ SOAJ INJECTION    Inject 0.3 mLs (0.3 mg total) into the muscle once.   FAMOTIDINE (PEPCID) 20 MG TABLET    Take 1 tablet (20 mg total) by mouth 2 (two) times daily.   PREDNISONE (DELTASONE) 50 MG TABLET    Take 1 tablet (50 mg total) by mouth daily with breakfast.        This chart was dictated using voice recognition software/Dragon. Despite best efforts to proofread, errors can occur which can change the meaning. Any change was purely unintentional.    Darletta Moll, PA-C 08/02/16 2317    Delman Kitten, MD 08/03/16 [phone]

## 2016-08-10 DIAGNOSIS — R51 Headache: Secondary | ICD-10-CM | POA: Diagnosis not present

## 2016-08-11 DIAGNOSIS — Z6832 Body mass index (BMI) 32.0-32.9, adult: Secondary | ICD-10-CM | POA: Diagnosis not present

## 2016-08-11 DIAGNOSIS — R5383 Other fatigue: Secondary | ICD-10-CM | POA: Diagnosis not present

## 2016-08-11 DIAGNOSIS — R030 Elevated blood-pressure reading, without diagnosis of hypertension: Secondary | ICD-10-CM | POA: Diagnosis not present

## 2016-08-11 DIAGNOSIS — E669 Obesity, unspecified: Secondary | ICD-10-CM | POA: Diagnosis not present

## 2016-08-12 DIAGNOSIS — R51 Headache: Secondary | ICD-10-CM | POA: Diagnosis not present

## 2016-08-20 ENCOUNTER — Encounter (HOSPITAL_COMMUNITY): Payer: Self-pay | Admitting: Vascular Surgery

## 2016-08-20 ENCOUNTER — Emergency Department (HOSPITAL_COMMUNITY)
Admission: EM | Admit: 2016-08-20 | Discharge: 2016-08-21 | Disposition: A | Discharge status: Home / Self Care | Payer: Medicare Other | Attending: Emergency Medicine | Admitting: Emergency Medicine

## 2016-08-20 DIAGNOSIS — Z87891 Personal history of nicotine dependence: Secondary | ICD-10-CM | POA: Diagnosis not present

## 2016-08-20 DIAGNOSIS — K5732 Diverticulitis of large intestine without perforation or abscess without bleeding: Secondary | ICD-10-CM | POA: Insufficient documentation

## 2016-08-20 DIAGNOSIS — Z79899 Other long term (current) drug therapy: Secondary | ICD-10-CM | POA: Diagnosis not present

## 2016-08-20 DIAGNOSIS — I10 Essential (primary) hypertension: Secondary | ICD-10-CM | POA: Diagnosis not present

## 2016-08-20 DIAGNOSIS — R1032 Left lower quadrant pain: Secondary | ICD-10-CM | POA: Diagnosis present

## 2016-08-20 LAB — CBC
HEMATOCRIT: 41.1 % (ref 36.0–46.0)
Hemoglobin: 14 g/dL (ref 12.0–15.0)
MCH: 30.8 pg (ref 26.0–34.0)
MCHC: 34.1 g/dL (ref 30.0–36.0)
MCV: 90.5 fL (ref 78.0–100.0)
Platelets: 268 10*3/uL (ref 150–400)
RBC: 4.54 MIL/uL (ref 3.87–5.11)
RDW: 13.7 % (ref 11.5–15.5)
WBC: 9.5 10*3/uL (ref 4.0–10.5)

## 2016-08-20 LAB — URINALYSIS, ROUTINE W REFLEX MICROSCOPIC
BILIRUBIN URINE: NEGATIVE
GLUCOSE, UA: NEGATIVE mg/dL
HGB URINE DIPSTICK: NEGATIVE
KETONES UR: NEGATIVE mg/dL
Leukocytes, UA: NEGATIVE
NITRITE: NEGATIVE
PH: 7 (ref 5.0–8.0)
Protein, ur: NEGATIVE mg/dL
Specific Gravity, Urine: 1.011 (ref 1.005–1.030)

## 2016-08-20 LAB — COMPREHENSIVE METABOLIC PANEL
ALBUMIN: 3.5 g/dL (ref 3.5–5.0)
ALK PHOS: 75 U/L (ref 38–126)
ALT: 28 U/L (ref 14–54)
AST: 29 U/L (ref 15–41)
Anion gap: 12 (ref 5–15)
BILIRUBIN TOTAL: 1.6 mg/dL — AB (ref 0.3–1.2)
BUN: 8 mg/dL (ref 6–20)
CALCIUM: 8.9 mg/dL (ref 8.9–10.3)
CO2: 23 mmol/L (ref 22–32)
Chloride: 102 mmol/L (ref 101–111)
Creatinine, Ser: 0.81 mg/dL (ref 0.44–1.00)
GFR calc Af Amer: >60 mL/min (ref >60)
GFR calc non Af Amer: >60 mL/min (ref >60)
GLUCOSE: 113 mg/dL — AB (ref 65–99)
POTASSIUM: 3.6 mmol/L (ref 3.5–5.1)
Sodium: 137 mmol/L (ref 135–145)
TOTAL PROTEIN: 6.3 g/dL — AB (ref 6.5–8.1)

## 2016-08-20 LAB — LIPASE, BLOOD: Lipase: 27 U/L (ref 11–51)

## 2016-08-20 MED ORDER — ONDANSETRON 4 MG PO TBDP
ORAL_TABLET | ORAL | Status: AC
  Filled 2016-08-20: qty 1

## 2016-08-20 MED ORDER — ONDANSETRON 4 MG PO TBDP
4.0000 mg | ORAL_TABLET | Freq: Once | ORAL | Status: AC | PRN
  Administered 2016-08-20: 4 mg via ORAL

## 2016-08-20 NOTE — ED Triage Notes (Signed)
Pt reports to the ED for eval of LLQ abd pain. She reports she has had it since last Saturday and she had some leftover abx but they had expired and it did not help. Pt has hx of diverticulitis and states this feels similar. Reports she has had nausea and painful loose BMs. Denies any active vomiting but had had decreased PO intake r/t the nausea. Also reports a fever, took an Aleve.

## 2016-08-21 DIAGNOSIS — K5732 Diverticulitis of large intestine without perforation or abscess without bleeding: Secondary | ICD-10-CM | POA: Diagnosis not present

## 2016-08-21 MED ORDER — ONDANSETRON 4 MG PO TBDP: 4.0000 mg | ORAL_TABLET | Freq: Three times a day (TID) | ORAL | 0 refills | Status: DC | PRN

## 2016-08-21 MED ORDER — CIPROFLOXACIN HCL 500 MG PO TABS
500.0000 mg | ORAL_TABLET | Freq: Once | ORAL | Status: AC
  Administered 2016-08-21: 500 mg via ORAL
  Filled 2016-08-21: qty 1

## 2016-08-21 MED ORDER — CIPROFLOXACIN HCL 500 MG PO TABS
500.0000 mg | ORAL_TABLET | Freq: Two times a day (BID) | ORAL | 0 refills | Status: DC
Start: 2016-08-21 — End: 2017-03-18

## 2016-08-21 MED ORDER — METRONIDAZOLE 500 MG PO TABS
500.0000 mg | ORAL_TABLET | Freq: Once | ORAL | Status: AC
  Administered 2016-08-21: 500 mg via ORAL
  Filled 2016-08-21: qty 1

## 2016-08-21 MED ORDER — METRONIDAZOLE 500 MG PO TABS: 500.0000 mg | ORAL_TABLET | Freq: Three times a day (TID) | ORAL | 0 refills | Status: DC

## 2016-08-21 NOTE — ED Notes (Signed)
C/o LLQ abd pain, also nausea, dark urine and fever, (denies: vd, constipation, dizziness, bleeding or other sx), pain onset last Saturday, fever onset last night (101), states, "pain c/w past diverticulitis", started taking flagyl and cipro, not getting better, BMs soft, takes miralax daily, last BM tonight, last ate 1600 Thursday. Alert, NAD, calm, interactive, resps e/u, skin W&D, no dyspnea noted, steady gait.

## 2016-08-21 NOTE — ED Provider Notes (Signed)
[place] DEPT Provider Note   CSN: [id number] Arrival date & time: 08/20/16  2123  By signing my name below, I, [name], attest that this documentation has been prepared under the direction and in the presence of [name], MD. Electronically Signed: [name], ED Scribe. 08/21/16. 12:50 AM.  History   Chief Complaint Chief Complaint  Patient presents with  . Abdominal Pain   The history is provided by the patient. No language interpreter was used.    HPI Comments: Patricia [name] is a 65 y.o. female who presents to the Emergency Department complaining of intermittent, cramping/spasmotic lower left quadrant abdominal pain onset approximately 6 days ago. Pt reports associated loss of appetite and nausea secondary to the onset of her abdominal pain. She additionally notes an associated fever which began tonight as well. Pt has a h/o diverticulitis and states that her symptoms today feel similar. Her pain is exacerbated with urine voids and bowel movements. Pt takes Miralax daily per her GI, but denies any recent constipation. Pt has been taking Ciprofloxacin and Flagyl BID with minimal relief of her current pain, however, she states that both of these rx's were from prior infections and were both expired. Pt has had decreased PO intake since the onset of her symptoms. Denies bloody stools, vomiting, or any other associated symptoms.   Past Medical History:  Diagnosis Date  . Aneurysm of aorta (HCC)   . Arthritis   . Atrophic vaginitis   . Depression   . Diverticulitis   . Edema of both legs    She takes Lasix a couple of times per week to decrease swelling.    . Fatty liver   . Hyperlipidemia   . Hypertension   . Memory difficulty   . Obesity   . Reflux   . Shingles    Patient Active Problem List   Diagnosis Date Noted  . Essential hypertension, benign 02/26/2013  . Obesity, unspecified 02/26/2013  . Aneurysm of aorta (HCC)   . Depression     . Fatty liver   . Reflux   . Atrophic vaginitis   . DYSPNEA 06/04/2009  . HYPERLIPIDEMIA 06/03/2009  . HYPERTENSION 06/03/2009  . MITRAL VALVE PROLAPSE 06/03/2009  . ESOPHAGEAL REFLUX 06/03/2009   Past Surgical History:  Procedure Laterality Date  . ABDOMINAL HYSTERECTOMY    . AUGMENTATION MAMMAPLASTY    . Basal and Squamous cell cancers excised    . CESAREAN SECTION     X 2  . KNEE SURGERY     OB History    Gravida Para Term Preterm AB Living   2 2 2     2    SAB TAB Ectopic Multiple Live Births                 Home Medications    Prior to Admission medications   Medication Sig Start Date End Date Taking? Authorizing Provider  ALPRAZolam Duanne Moron) 0.5 MG tablet Take 0.5 mg by mouth as needed for anxiety. For travel 01/16/14   Historical Provider, MD  BIOTIN PO Take 2 tablets by mouth daily. 3 times daily    Historical Provider, MD  famotidine (PEPCID) 20 MG tablet Take 1 tablet (20 mg total) by mouth 2 (two) times daily. 08/02/16 08/02/17  Roderic Palau D Cuthriell, PA-C  polyethylene glycol (MIRALAX / GLYCOLAX) packet Take 8.4 g by mouth daily.     Historical Provider, MD  predniSONE (DELTASONE) 50 MG tablet Take 1 tablet (50 mg total) by  mouth daily with breakfast. 08/02/16   Charline Bills Cuthriell, PA-C  triamterene-hydrochlorothiazide (MAXZIDE-25) 37.5-25 MG per tablet Take 1 tablet by mouth daily.      Historical Provider, MD   Family History Family History  Problem Relation Age of Onset  . Uterine cancer Mother   . Heart disease Mother   . Diabetes Mother   . Hypertension Mother   . Hyperlipidemia Mother   . Alzheimer's disease Father   . Hypertension Father   . Cancer Paternal Grandmother     Gallbladder cancer  . Diverticulitis Paternal Grandmother     gallbladder  . Liver cancer Paternal Grandfather   . Diverticulitis Paternal Grandfather   . Diverticulitis Maternal Grandmother   . Breast cancer Cousin     Paternal 1st cousin Age 13  . Depression Sister   .  Bipolar disorder Sister    Social History Social History  Substance Use Topics  . Smoking status: Former Smoker    Packs/day: 1.00    Years: 25.00  . Smokeless tobacco: Never Used  . Alcohol use 0.0 oz/week     Comment: Rare   Allergies   Morphine and Tetracyclines & related  Review of Systems Review of Systems  Gastrointestinal: Positive for abdominal pain.   10 Systems reviewed and all are negative for acute change except as noted in the HPI.  Physical Exam Updated Vital Signs BP 118/87 (BP Location: Right Arm)   Pulse 110   Temp 97.8 F (36.6 C) (Oral)   Resp 16   SpO2 96%   Physical Exam  Constitutional: She is oriented to person, place, and time. She appears well-developed and well-nourished. No distress.  HENT:  Head: Normocephalic and atraumatic.  Nose: Nose normal.  Mouth/Throat: Oropharynx is clear and moist. No oropharyngeal exudate.  Eyes: Conjunctivae and EOM are normal. Pupils are equal, round, and reactive to light. No scleral icterus.  Neck: Normal range of motion. Neck supple. No JVD present. No tracheal deviation present. No thyromegaly present.  Cardiovascular: Normal rate, regular rhythm and normal heart sounds.  Exam reveals no gallop and no friction rub.   No murmur heard. Pulmonary/Chest: Effort normal and breath sounds normal. No respiratory distress. She has no wheezes. She exhibits no tenderness.  Abdominal: Soft. Bowel sounds are normal. She exhibits no distension and no mass. There is tenderness. There is no rebound and no guarding.  LLQ TTP.   Musculoskeletal: Normal range of motion. She exhibits no edema or tenderness.  Lymphadenopathy:    She has no cervical adenopathy.  Neurological: She is alert and oriented to person, place, and time. No cranial nerve deficit. She exhibits normal muscle tone.  Skin: Skin is warm and dry. No rash noted. No erythema. No pallor.  Nursing note and vitals reviewed.  ED Treatments / Results  DIAGNOSTIC  STUDIES: Oxygen Saturation is 96% on RA, normal by my interpretation.   COORDINATION OF CARE: 12:50 AM-Discussed next steps with pt. Pt verbalized understanding and is agreeable with the plan.   Labs (all labs ordered are listed, but only abnormal results are displayed) Labs Reviewed  COMPREHENSIVE METABOLIC PANEL - Abnormal; Notable for the following:       Result Value   Glucose, Bld 113 (*)    Total Protein 6.3 (*)    Total Bilirubin 1.6 (*)    All other components within normal limits  LIPASE, BLOOD  CBC  URINALYSIS, ROUTINE W REFLEX MICROSCOPIC (NOT AT [facility])   EKG  EKG Interpretation None  Radiology No results found.  Procedures Procedures   Medications Ordered in ED Medications  ondansetron (ZOFRAN-ODT) 4 MG disintegrating tablet (not administered)  ondansetron (ZOFRAN-ODT) disintegrating tablet 4 mg (4 mg Oral Given 08/20/16 2142)   Initial Impression / Assessment and Plan / ED Course  I have reviewed the triage vital signs and the nursing notes.  Pertinent labs & imaging results that were available during my care of the patient were reviewed by me and considered in my medical decision making (see chart for details).  Clinical Course   Patient presents to the ED for abdominal pain in the LLQ, consistent with diverticulitis.  She was taking expired medications.  Will start her on cipro flagyl in the ED.  Also Rx for zofran to take at home as needed.  GI fu advised.  No need for CT scan as this is a clinical diagnosis, she has had this before and it feel similar.  She agrees with plan.  She appears well and in NAD. VS remain within her normal limits and she is safe for Dc.  Final Clinical Impressions(s) / ED Diagnoses   Final diagnoses:  None   New Prescriptions New Prescriptions   No medications on file      I personally performed the services described in this documentation, which was scribed in my presence. The recorded information has been  reviewed and is accurate.      [name], MD 08/21/16 [phone]

## 2016-08-21 NOTE — ED Notes (Signed)
Dr. Claudine Mouton at [facility].

## 2016-08-26 ENCOUNTER — Telehealth: Payer: Self-pay | Admitting: Cardiology

## 2016-08-26 NOTE — Telephone Encounter (Signed)
Walk in Pt Form-[facility]-Clearance Dropped off gave to Pam/Skains.

## 2016-08-31 DIAGNOSIS — L905 Scar conditions and fibrosis of skin: Secondary | ICD-10-CM | POA: Diagnosis not present

## 2016-09-02 ENCOUNTER — Other Ambulatory Visit: Payer: Self-pay | Admitting: Gastroenterology

## 2016-09-02 DIAGNOSIS — K5732 Diverticulitis of large intestine without perforation or abscess without bleeding: Secondary | ICD-10-CM | POA: Diagnosis not present

## 2016-09-04 ENCOUNTER — Emergency Department (HOSPITAL_COMMUNITY): Payer: Medicare Other

## 2016-09-04 ENCOUNTER — Encounter (HOSPITAL_COMMUNITY): Payer: Self-pay | Admitting: *Deleted

## 2016-09-04 ENCOUNTER — Emergency Department (HOSPITAL_COMMUNITY)
Admission: EM | Admit: 2016-09-04 | Discharge: 2016-09-04 | Disposition: A | Discharge status: Home / Self Care | Payer: Medicare Other | Attending: Physician Assistant | Admitting: Physician Assistant

## 2016-09-04 DIAGNOSIS — W5501XA Bitten by cat, initial encounter: Secondary | ICD-10-CM | POA: Diagnosis not present

## 2016-09-04 DIAGNOSIS — Z87891 Personal history of nicotine dependence: Secondary | ICD-10-CM | POA: Diagnosis not present

## 2016-09-04 DIAGNOSIS — S91352A Open bite, left foot, initial encounter: Secondary | ICD-10-CM | POA: Insufficient documentation

## 2016-09-04 DIAGNOSIS — Z23 Encounter for immunization: Secondary | ICD-10-CM | POA: Insufficient documentation

## 2016-09-04 DIAGNOSIS — Y999 Unspecified external cause status: Secondary | ICD-10-CM | POA: Insufficient documentation

## 2016-09-04 DIAGNOSIS — S91332A Puncture wound without foreign body, left foot, initial encounter: Secondary | ICD-10-CM | POA: Diagnosis not present

## 2016-09-04 DIAGNOSIS — Y929 Unspecified place or not applicable: Secondary | ICD-10-CM | POA: Insufficient documentation

## 2016-09-04 DIAGNOSIS — I10 Essential (primary) hypertension: Secondary | ICD-10-CM | POA: Diagnosis not present

## 2016-09-04 DIAGNOSIS — Y939 Activity, unspecified: Secondary | ICD-10-CM | POA: Diagnosis not present

## 2016-09-04 DIAGNOSIS — Z203 Contact with and (suspected) exposure to rabies: Secondary | ICD-10-CM | POA: Insufficient documentation

## 2016-09-04 MED ORDER — RABIES VACCINE, PCEC IM SUSR
1.0000 mL | Freq: Once | INTRAMUSCULAR | Status: AC
  Administered 2016-09-04: 1 mL via INTRAMUSCULAR
  Filled 2016-09-04: qty 1

## 2016-09-04 MED ORDER — RABIES IMMUNE GLOBULIN 150 UNIT/ML IM INJ
20.0000 [IU]/kg | INJECTION | Freq: Once | INTRAMUSCULAR | Status: AC
  Administered 2016-09-04: 1725 [IU]
  Filled 2016-09-04: qty 11.5

## 2016-09-04 MED ORDER — AMOXICILLIN-POT CLAVULANATE 875-125 MG PO TABS: 1.0000 | ORAL_TABLET | Freq: Two times a day (BID) | ORAL | 0 refills | Status: AC

## 2016-09-04 NOTE — ED Provider Notes (Signed)
[facility] DEPT Provider Note   CSN: [id number] Arrival date & time: 09/04/16  1513  By signing my name below, I, Emmanuella Mensah, attest that this documentation has been prepared under the direction and in the presence of [employer], PA-C. Electronically Signed: Judithann Sauger, ED Scribe. 09/04/16. 6:26 PM.   History   Chief Complaint Chief Complaint  Patient presents with  . Animal Bite    HPI Comments: HINDA LINDOR is a 65 y.o. female with a hx of hypertension who presents to the Emergency Department complaining of one puncture wound to the medial aspect of her left foot s/p cat bite that occurred at noon today. She states that she bled from a wound immediately after the bite but denies any current pain at that site. She explains that she was feeding wild cats at her house when the cat turned and bit her as she went to pet it. She adds that people normally drop their cats off by the woods near her house. No alleviating factors noted. Pt has not tried any medications PTA. She states that she is began taking a course of Flagyl and Cipro for Diverticulitis 2 days ago. Pt was evaluated on 01/19/15 s/p cat bite to her right forearm where she received the Rabies immunoglobulin and Rabavert initial dose as well as a tetanus vaccine. She states that she finished the rabies series at that time. Pt has an allergy to Morphine and Tetracyclines. She denies any current fever, chills, shortness of breath, generalized rash, lip/tongue swelling, or any other symptoms.   The history is provided by the patient. No language interpreter was used.    Past Medical History:  Diagnosis Date  . Aneurysm of aorta (HCC)   . Arthritis   . Atrophic vaginitis   . Depression   . Diverticulitis   . Edema of both legs    She takes Lasix a couple of times per week to decrease swelling.    . Fatty liver   . Hyperlipidemia   . Hypertension   . Memory difficulty   . Obesity   . Reflux   .  Shingles     Patient Active Problem List   Diagnosis Date Noted  . Essential hypertension, benign 02/26/2013  . Obesity, unspecified 02/26/2013  . Aneurysm of aorta (HCC)   . Depression   . Fatty liver   . Reflux   . Atrophic vaginitis   . DYSPNEA 06/04/2009  . HYPERLIPIDEMIA 06/03/2009  . HYPERTENSION 06/03/2009  . MITRAL VALVE PROLAPSE 06/03/2009  . ESOPHAGEAL REFLUX 06/03/2009    Past Surgical History:  Procedure Laterality Date  . ABDOMINAL HYSTERECTOMY    . AUGMENTATION MAMMAPLASTY    . Basal and Squamous cell cancers excised    . CESAREAN SECTION     X 2  . KNEE SURGERY      OB History    Gravida Para Term Preterm AB Living   2 2 2     2    SAB TAB Ectopic Multiple Live Births                   Home Medications    Prior to Admission medications   Medication Sig Start Date End Date Taking? Authorizing Provider  ALPRAZolam Duanne Moron) 0.5 MG tablet Take 0.5 mg by mouth as needed for anxiety. For travel 01/16/14   Historical Provider, MD  amoxicillin-clavulanate (AUGMENTIN) 875-125 MG tablet Take 1 tablet by mouth every 12 (twelve) hours. 09/04/16 09/11/16  Kalman Drape, PA  BIOTIN PO Take 2 tablets by mouth daily. 3 times daily    Historical Provider, MD  ciprofloxacin (CIPRO) 500 MG tablet Take 1 tablet (500 mg total) by mouth 2 (two) times daily. One po bid x 7 days 08/21/16   [name], MD  famotidine (PEPCID) 20 MG tablet Take 1 tablet (20 mg total) by mouth 2 (two) times daily. 08/02/16 08/02/17  Roderic Palau D Cuthriell, PA-C  metroNIDAZOLE (FLAGYL) 500 MG tablet Take 1 tablet (500 mg total) by mouth 3 (three) times daily. One po bid x 7 days 08/21/16   [name], MD  ondansetron (ZOFRAN ODT) 4 MG disintegrating tablet Take 1 tablet (4 mg total) by mouth every 8 (eight) hours as needed for nausea or vomiting. 08/21/16   [name], MD  polyethylene glycol (MIRALAX / GLYCOLAX) packet Take 8.4 g by mouth daily.     Historical Provider, MD  predniSONE (DELTASONE) 50  MG tablet Take 1 tablet (50 mg total) by mouth daily with breakfast. 08/02/16   Charline Bills Cuthriell, PA-C  triamterene-hydrochlorothiazide (MAXZIDE-25) 37.5-25 MG per tablet Take 1 tablet by mouth daily.      Historical Provider, MD    Family History Family History  Problem Relation Age of Onset  . Uterine cancer Mother   . Heart disease Mother   . Diabetes Mother   . Hypertension Mother   . Hyperlipidemia Mother   . Alzheimer's disease Father   . Hypertension Father   . Cancer Paternal Grandmother     Gallbladder cancer  . Diverticulitis Paternal Grandmother     gallbladder  . Liver cancer Paternal Grandfather   . Diverticulitis Paternal Grandfather   . Diverticulitis Maternal Grandmother   . Breast cancer Cousin     Paternal 1st cousin Age 57  . Depression Sister   . Bipolar disorder Sister     Social History Social History  Substance Use Topics  . Smoking status: Former Smoker    Packs/day: 1.00    Years: 25.00  . Smokeless tobacco: Never Used  . Alcohol use 0.0 oz/week     Comment: Rare     Allergies   Morphine and Tetracyclines & related   Review of Systems Review of Systems  Constitutional: Negative for chills and fever.  HENT: Negative for facial swelling.   Respiratory: Negative for shortness of breath.   Cardiovascular: Negative for chest pain.  Skin: Positive for wound. Negative for rash.  All other systems reviewed and are negative.    Physical Exam Updated Vital Signs BP 124/78   Pulse 90   Temp 98.3 F (36.8 C)   Resp 16   Ht 5' 4"  (1.626 m)   Wt 188 lb 8 oz (85.5 kg)   SpO2 94%   BMI 32.36 kg/m   Physical Exam  Constitutional: She appears well-developed and well-nourished. No distress.  HENT:  Head: Normocephalic and atraumatic.  Eyes: Conjunctivae are normal.  Cardiovascular: Normal rate and regular rhythm.   No murmur heard. Pulmonary/Chest: Effort normal. No respiratory distress.  Musculoskeletal: Normal range of motion.    Small puncture wound noted to the medial aspect of her left foot, anterior to the medial malleolus; bleeding controlled; no surrounding erythema, edema, or increased warmth 2+ DP pulses, sensation intact, patient is neurovascularly intact distally.  Neurological: She is alert. Coordination normal.  Skin: Skin is warm and dry. She is not diaphoretic.  Psychiatric: She has a normal mood and affect. Her behavior is normal.  Nursing note and vitals reviewed.  ED Treatments / Results  DIAGNOSTIC STUDIES: Oxygen Saturation is 94% on RA, adequate by my interpretation.    COORDINATION OF CARE: 6:21 PM- Pt advised of plan for treatment and pt agrees. Pt informed of her x-ray results. She will receive Hyperab here. Will consult infectious disease.   Labs (all labs ordered are listed, but only abnormal results are displayed) Labs Reviewed - No data to display  EKG  EKG Interpretation None       Radiology Dg Foot Complete Left  Result Date: 09/04/2016 CLINICAL DATA:  Cat bite earlier today. EXAM: LEFT FOOT - COMPLETE 3+ VIEW COMPARISON:  None. FINDINGS: Degenerative changes with no foreign body, fracture, or dislocation. IMPRESSION: Negative. Electronically Signed   By: Dorise Bullion III M.D   On: 09/04/2016 17:54    Procedures Procedures (including critical care time)  Medications Ordered in ED Medications  rabies immune globulin (HYPERAB) injection 1,725 Units (1,725 Units Infiltration Given 09/04/16 1958)  rabies vaccine (RABAVERT) injection 1 mL (1 mL Intramuscular Given 09/04/16 1956)     Initial Impression / Assessment and Plan / ED Course  Jackson Latino, PA-C has reviewed the triage vital signs and the nursing notes.  Pertinent labs & imaging results that were available during my care of the patient were reviewed by me and considered in my medical decision making (see chart for details).  Clinical Course    Patient presents with cat bite from a wild cat. I  consulted infectious disease and spoke with Dr. Linus Salmons. Patient had rabies vaccine series last April. Infectious disease stated patient needed the series again with this cat bite.  Pt d/c with Augmentin per Dr. Thomasene Lot. Tylenol or Advil for pain. Follow-up arrangements made for RabAvert subsequent doses days 3,7,14 and 28. Patient advised to call animal control. Discussed strict return precautions the ED. Patient expressed understanding to the discharge instructions.  Final Clinical Impressions(s) / ED Diagnoses   Final diagnoses:  Cat bite, initial encounter  Need for rabies vaccination    New Prescriptions Discharge Medication List as of 09/04/2016  8:23 PM    START taking these medications   Details  amoxicillin-clavulanate (AUGMENTIN) 875-125 MG tablet Take 1 tablet by mouth every 12 (twelve) hours., Starting Sat 09/04/2016, Until Sat 09/11/2016, Print       I personally performed the services described in this documentation, which was scribed in my presence. The recorded information has been reviewed and is accurate.        Kalman Drape, [place] 09/04/16 2041    Courteney Julio Alm, MD 09/04/16 2303

## 2016-09-04 NOTE — Discharge Instructions (Signed)
Return to the ER for RabAvert subsequent doses days 3,7,14 and 28. Call animal control. Return to the ER sooner if you experience signs of infection to include redness, warmth, swelling, red streaks, pain or discharge around your wound, fever, or any other concerning symptoms.   Take the Augmentin as prescribed and be sure to complete the entire course. Stop this antibiotic and return to the emergency department if you experience rash or diarrhea.

## 2016-09-04 NOTE — ED Triage Notes (Signed)
The pt was bitten by a wild cat  On her lt foot   Earlier today  No active bleeding  herfe for rabies shots she had rabies shots in 2016

## 2016-09-06 ENCOUNTER — Ambulatory Visit
Admission: RE | Admit: 2016-09-06 | Discharge: 2016-09-06 | Disposition: A | Discharge status: Home / Self Care | Payer: Medicare Other | Source: Ambulatory Visit | Attending: Gastroenterology | Admitting: Gastroenterology

## 2016-09-06 DIAGNOSIS — N200 Calculus of kidney: Secondary | ICD-10-CM | POA: Diagnosis not present

## 2016-09-06 DIAGNOSIS — K5732 Diverticulitis of large intestine without perforation or abscess without bleeding: Secondary | ICD-10-CM

## 2016-09-06 MED ORDER — IOPAMIDOL (ISOVUE-300) INJECTION 61%
100.0000 mL | Freq: Once | INTRAVENOUS | Status: AC | PRN
  Administered 2016-09-06: 100 mL via INTRAVENOUS

## 2016-09-07 ENCOUNTER — Encounter (HOSPITAL_COMMUNITY): Payer: Self-pay | Admitting: *Deleted

## 2016-09-07 ENCOUNTER — Emergency Department (HOSPITAL_COMMUNITY)
Admission: EM | Admit: 2016-09-07 | Discharge: 2016-09-07 | Disposition: A | Discharge status: Home / Self Care | Payer: Medicare Other | Attending: Emergency Medicine | Admitting: Emergency Medicine

## 2016-09-07 DIAGNOSIS — Z79899 Other long term (current) drug therapy: Secondary | ICD-10-CM | POA: Diagnosis not present

## 2016-09-07 DIAGNOSIS — I10 Essential (primary) hypertension: Secondary | ICD-10-CM | POA: Insufficient documentation

## 2016-09-07 DIAGNOSIS — Z2914 Encounter for prophylactic rabies immune globin: Secondary | ICD-10-CM | POA: Diagnosis not present

## 2016-09-07 DIAGNOSIS — Z203 Contact with and (suspected) exposure to rabies: Secondary | ICD-10-CM | POA: Diagnosis not present

## 2016-09-07 DIAGNOSIS — Z87891 Personal history of nicotine dependence: Secondary | ICD-10-CM | POA: Diagnosis not present

## 2016-09-07 DIAGNOSIS — Z23 Encounter for immunization: Secondary | ICD-10-CM

## 2016-09-07 MED ORDER — RABIES VACCINE, PCEC IM SUSR
1.0000 mL | Freq: Once | INTRAMUSCULAR | Status: AC
  Administered 2016-09-07: 1 mL via INTRAMUSCULAR
  Filled 2016-09-07: qty 1

## 2016-09-07 NOTE — ED Triage Notes (Signed)
Here for the second rabies shot  She thinks she does not need any others  She is soon going on a cruise

## 2016-09-07 NOTE — ED Notes (Signed)
EDP at bedside  

## 2016-09-07 NOTE — ED Provider Notes (Signed)
[facility] DEPT Provider Note   CSN: [id number] Arrival date & time: 09/07/16  1921  By signing my name below, I, Dora Sims, attest that this documentation has been prepared under the direction and in the presence of Janetta Hora, PA-C. Electronically Signed: Dora Sims, Scribe. 09/07/2016. 7:51 PM.  History   Chief Complaint Chief Complaint  Patient presents with  . Rabies Injection    The history is provided by the patient. No language interpreter was used.     HPI Comments: Patricia [name] is a 65 y.o. female who presents to the Emergency Department for continuation of rabies injection series. Pt was seen here on 09/04/16 after being bitten by a feral cat on her left foot and had her first rabies vaccination; she is here today for the second vaccination. She had the rabies series last year and believes she will not need anymore shots after today. She was prescribed Augmentin during her visit on 09/04/16 and has been taking it as directed. She denies fever, chills, numbness, weakness, or any other associated symptoms.  Past Medical History:  Diagnosis Date  . Aneurysm of aorta (HCC)   . Arthritis   . Atrophic vaginitis   . Depression   . Diverticulitis   . Edema of both legs    She takes Lasix a couple of times per week to decrease swelling.    . Fatty liver   . Hyperlipidemia   . Hypertension   . Memory difficulty   . Obesity   . Reflux   . Shingles     Patient Active Problem List   Diagnosis Date Noted  . Essential hypertension, benign 02/26/2013  . Obesity, unspecified 02/26/2013  . Aneurysm of aorta (HCC)   . Depression   . Fatty liver   . Reflux   . Atrophic vaginitis   . DYSPNEA 06/04/2009  . HYPERLIPIDEMIA 06/03/2009  . HYPERTENSION 06/03/2009  . MITRAL VALVE PROLAPSE 06/03/2009  . ESOPHAGEAL REFLUX 06/03/2009    Past Surgical History:  Procedure Laterality Date  . ABDOMINAL HYSTERECTOMY    . AUGMENTATION MAMMAPLASTY    . Basal and  Squamous cell cancers excised    . CESAREAN SECTION     X 2  . KNEE SURGERY      OB History    Gravida Para Term Preterm AB Living   2 2 2     2    SAB TAB Ectopic Multiple Live Births                   Home Medications    Prior to Admission medications   Medication Sig Start Date End Date Taking? Authorizing Provider  ALPRAZolam Duanne Moron) 0.5 MG tablet Take 0.5 mg by mouth as needed for anxiety. For travel 01/16/14   Historical Provider, MD  amoxicillin-clavulanate (AUGMENTIN) 875-125 MG tablet Take 1 tablet by mouth every 12 (twelve) hours. 09/04/16 09/11/16  [name] Focht, PA  BIOTIN PO Take 2 tablets by mouth daily. 3 times daily    Historical Provider, MD  ciprofloxacin (CIPRO) 500 MG tablet Take 1 tablet (500 mg total) by mouth 2 (two) times daily. One po bid x 7 days 08/21/16   [name], MD  famotidine (PEPCID) 20 MG tablet Take 1 tablet (20 mg total) by mouth 2 (two) times daily. 08/02/16 08/02/17  Roderic Palau D Cuthriell, PA-C  metroNIDAZOLE (FLAGYL) 500 MG tablet Take 1 tablet (500 mg total) by mouth 3 (three) times daily. One po bid x 7 days 08/21/16  [name], MD  ondansetron (ZOFRAN ODT) 4 MG disintegrating tablet Take 1 tablet (4 mg total) by mouth every 8 (eight) hours as needed for nausea or vomiting. 08/21/16   [name], MD  polyethylene glycol (MIRALAX / GLYCOLAX) packet Take 8.4 g by mouth daily.     Historical Provider, MD  predniSONE (DELTASONE) 50 MG tablet Take 1 tablet (50 mg total) by mouth daily with breakfast. 08/02/16   Charline Bills Cuthriell, PA-C  triamterene-hydrochlorothiazide (MAXZIDE-25) 37.5-25 MG per tablet Take 1 tablet by mouth daily.      Historical Provider, MD    Family History Family History  Problem Relation Age of Onset  . Uterine cancer Mother   . Heart disease Mother   . Diabetes Mother   . Hypertension Mother   . Hyperlipidemia Mother   . Alzheimer's disease Father   . Hypertension Father   . Cancer Paternal Grandmother      Gallbladder cancer  . Diverticulitis Paternal Grandmother     gallbladder  . Liver cancer Paternal Grandfather   . Diverticulitis Paternal Grandfather   . Diverticulitis Maternal Grandmother   . Breast cancer Cousin     Paternal 1st cousin Age 85  . Depression Sister   . Bipolar disorder Sister     Social History Social History  Substance Use Topics  . Smoking status: Former Smoker    Packs/day: 1.00    Years: 25.00  . Smokeless tobacco: Never Used  . Alcohol use 0.0 oz/week     Comment: Rare     Allergies   Morphine and Tetracyclines & related   Review of Systems Review of Systems  Constitutional: Negative for chills and fever.  Skin: Positive for wound.  Neurological: Negative for weakness and numbness.     Physical Exam Updated Vital Signs BP 126/73 (BP Location: Right Arm)   Pulse 89   Temp 97.7 F (36.5 C) (Oral)   Resp 18   Ht 5' 4"  (1.626 m)   Wt 189 lb (85.7 kg)   SpO2 99%   BMI 32.44 kg/m   Physical Exam  Constitutional: She is oriented to person, place, and time. She appears well-developed and well-nourished. No distress.  HENT:  Head: Normocephalic and atraumatic.  Eyes: Conjunctivae and EOM are normal.  Neck: Neck supple. No tracheal deviation present.  Cardiovascular: Normal rate.   Pulmonary/Chest: Effort normal. No respiratory distress.  Musculoskeletal: Normal range of motion.  Neurological: She is alert and oriented to person, place, and time.  Skin: Skin is warm and dry.  Pinpoint red bite mark noted on medial aspect of right foot. Appears to be healing well.  Psychiatric: She has a normal mood and affect. Her behavior is normal.  Nursing note and vitals reviewed.    ED Treatments / Results  Labs (all labs ordered are listed, but only abnormal results are displayed) Labs Reviewed - No data to display  EKG  EKG Interpretation None       Radiology Ct Abdomen Pelvis W Contrast  Result Date: 09/06/2016 CLINICAL DATA:   Lower pelvic pain for 1 month. EXAM: CT ABDOMEN AND PELVIS WITH CONTRAST TECHNIQUE: Multidetector CT imaging of the abdomen and pelvis was performed using the standard protocol following bolus administration of intravenous contrast. CONTRAST:  176m ISOVUE-300 IOPAMIDOL (ISOVUE-300) INJECTION 61% COMPARISON:  CT scan of December 06, 2014. FINDINGS: Lower chest: Visualized lung bases are unremarkable. Hepatobiliary: No gallstones are noted.  Normal liver. Pancreas: Normal. Spleen: Normal. Adrenals/Urinary Tract: Adrenal glands appear normal.  1 cm nonobstructive calculus is noted in upper pole collecting system of left kidney. No hydronephrosis or renal obstruction is noted. Urinary bladder appears normal. No ureteral calculi are noted. Stomach/Bowel: There is no evidence of bowel obstruction. Diverticulosis of descending and sigmoid colon is noted without inflammation. Vascular/Lymphatic: Atherosclerosis of abdominal aorta is noted without aneurysm formation. No significant adenopathy is noted. Reproductive: Status post hysterectomy. Other: No abnormal fluid collection is noted. Musculoskeletal: No significant osseous abnormality is noted. IMPRESSION: Aortic atherosclerosis. Nonobstructive left renal calculus. No hydronephrosis or renal obstruction is noted. Diverticulosis of descending and sigmoid colon without inflammation. No other significant abnormality seen in the abdomen or pelvis. Electronically Signed   By: Marijo Conception, M.D.   On: 09/06/2016 16:00    Procedures Procedures (including critical care time)  DIAGNOSTIC STUDIES: Oxygen Saturation is 99% on RA, normal by my interpretation.    COORDINATION OF CARE: 7:58 PM Discussed treatment plan with pt at bedside and pt agreed to plan.  Medications Ordered in ED Medications  rabies vaccine (RABAVERT) injection 1 mL (not administered)     Initial Impression / Assessment and Plan / ED Course  I have reviewed the triage vital signs and the  nursing notes.  Pertinent labs & imaging results that were available during my care of the patient were reviewed by me and considered in my medical decision making (see chart for details).  Clinical Course    65 year old female presents for 2nd dose of rabies vaccine. Since she has been previously vaccinated this is the final dose she needs per [facility]. Wound appears to be healing well. Patient is NAD, non-toxic, with stable VS. Patient is informed of clinical course, understands medical decision making process, and agrees with plan. Opportunity for questions provided and all questions answered. Return precautions given.   I personally performed the services described in this documentation, which was scribed in my presence. The recorded information has been reviewed and is accurate.   Final Clinical Impressions(s) / ED Diagnoses   Final diagnoses:  Need for rabies vaccination    New Prescriptions New Prescriptions   No medications on file     Recardo Evangelist, PA-C 09/07/16 2030    Noemi Chapel, MD 09/07/16 2101

## 2016-09-08 ENCOUNTER — Inpatient Hospital Stay: Admission: RE | Admit: 2016-09-08 | Payer: Medicare Other | Source: Ambulatory Visit

## 2016-09-21 ENCOUNTER — Encounter (HOSPITAL_COMMUNITY): Payer: Medicare Other

## 2016-09-27 ENCOUNTER — Inpatient Hospital Stay: Admit: 2016-09-27 | Payer: Medicare Other | Admitting: Orthopedic Surgery

## 2016-09-27 DIAGNOSIS — R1032 Left lower quadrant pain: Secondary | ICD-10-CM | POA: Diagnosis not present

## 2016-09-27 DIAGNOSIS — Z86010 Personal history of colonic polyps: Secondary | ICD-10-CM | POA: Diagnosis not present

## 2016-09-27 DIAGNOSIS — K5904 Chronic idiopathic constipation: Secondary | ICD-10-CM | POA: Diagnosis not present

## 2016-09-27 SURGERY — ARTHROPLASTY, KNEE, BILATERAL, TOTAL
Anesthesia: Spinal | Site: Knee | Laterality: Bilateral

## 2016-10-01 DIAGNOSIS — K219 Gastro-esophageal reflux disease without esophagitis: Secondary | ICD-10-CM | POA: Diagnosis not present

## 2016-10-01 DIAGNOSIS — N952 Postmenopausal atrophic vaginitis: Secondary | ICD-10-CM | POA: Diagnosis not present

## 2016-10-01 DIAGNOSIS — K5730 Diverticulosis of large intestine without perforation or abscess without bleeding: Secondary | ICD-10-CM | POA: Diagnosis not present

## 2016-10-01 DIAGNOSIS — I10 Essential (primary) hypertension: Secondary | ICD-10-CM | POA: Diagnosis not present

## 2016-10-01 DIAGNOSIS — I868 Varicose veins of other specified sites: Secondary | ICD-10-CM | POA: Diagnosis not present

## 2016-10-01 DIAGNOSIS — K5904 Chronic idiopathic constipation: Secondary | ICD-10-CM | POA: Diagnosis not present

## 2016-10-01 DIAGNOSIS — E669 Obesity, unspecified: Secondary | ICD-10-CM | POA: Diagnosis not present

## 2016-10-01 DIAGNOSIS — E782 Mixed hyperlipidemia: Secondary | ICD-10-CM | POA: Diagnosis not present

## 2016-10-01 DIAGNOSIS — G43909 Migraine, unspecified, not intractable, without status migrainosus: Secondary | ICD-10-CM | POA: Diagnosis not present

## 2016-10-01 DIAGNOSIS — K7581 Nonalcoholic steatohepatitis (NASH): Secondary | ICD-10-CM | POA: Diagnosis not present

## 2016-10-01 DIAGNOSIS — F322 Major depressive disorder, single episode, severe without psychotic features: Secondary | ICD-10-CM | POA: Diagnosis not present

## 2016-10-01 DIAGNOSIS — Z0000 Encounter for general adult medical examination without abnormal findings: Secondary | ICD-10-CM | POA: Diagnosis not present

## 2016-10-04 DIAGNOSIS — M25562 Pain in left knee: Secondary | ICD-10-CM | POA: Diagnosis not present

## 2016-10-04 DIAGNOSIS — M25561 Pain in right knee: Secondary | ICD-10-CM | POA: Diagnosis not present

## 2016-10-04 DIAGNOSIS — M170 Bilateral primary osteoarthritis of knee: Secondary | ICD-10-CM | POA: Diagnosis not present

## 2016-10-07 DIAGNOSIS — Z85828 Personal history of other malignant neoplasm of skin: Secondary | ICD-10-CM | POA: Diagnosis not present

## 2016-10-07 DIAGNOSIS — L570 Actinic keratosis: Secondary | ICD-10-CM | POA: Diagnosis not present

## 2016-10-07 DIAGNOSIS — L820 Inflamed seborrheic keratosis: Secondary | ICD-10-CM | POA: Diagnosis not present

## 2016-10-07 DIAGNOSIS — L814 Other melanin hyperpigmentation: Secondary | ICD-10-CM | POA: Diagnosis not present

## 2016-10-07 DIAGNOSIS — D1801 Hemangioma of skin and subcutaneous tissue: Secondary | ICD-10-CM | POA: Diagnosis not present

## 2016-11-17 DIAGNOSIS — M25562 Pain in left knee: Secondary | ICD-10-CM | POA: Diagnosis not present

## 2016-11-17 DIAGNOSIS — M170 Bilateral primary osteoarthritis of knee: Secondary | ICD-10-CM | POA: Diagnosis not present

## 2016-11-17 DIAGNOSIS — M25561 Pain in right knee: Secondary | ICD-10-CM | POA: Diagnosis not present

## 2016-12-06 DIAGNOSIS — J029 Acute pharyngitis, unspecified: Secondary | ICD-10-CM | POA: Diagnosis not present

## 2016-12-06 DIAGNOSIS — J069 Acute upper respiratory infection, unspecified: Secondary | ICD-10-CM | POA: Diagnosis not present

## 2017-01-03 DIAGNOSIS — R1032 Left lower quadrant pain: Secondary | ICD-10-CM | POA: Diagnosis not present

## 2017-01-03 DIAGNOSIS — K5792 Diverticulitis of intestine, part unspecified, without perforation or abscess without bleeding: Secondary | ICD-10-CM | POA: Diagnosis not present

## 2017-01-12 DIAGNOSIS — E669 Obesity, unspecified: Secondary | ICD-10-CM | POA: Diagnosis not present

## 2017-01-12 DIAGNOSIS — Z6831 Body mass index (BMI) 31.0-31.9, adult: Secondary | ICD-10-CM | POA: Diagnosis not present

## 2017-01-12 DIAGNOSIS — R030 Elevated blood-pressure reading, without diagnosis of hypertension: Secondary | ICD-10-CM | POA: Diagnosis not present

## 2017-01-12 DIAGNOSIS — R5383 Other fatigue: Secondary | ICD-10-CM | POA: Diagnosis not present

## 2017-01-17 ENCOUNTER — Encounter: Payer: Self-pay | Admitting: Nurse Practitioner

## 2017-01-17 ENCOUNTER — Telehealth: Payer: Self-pay

## 2017-01-17 DIAGNOSIS — I10 Essential (primary) hypertension: Secondary | ICD-10-CM | POA: Diagnosis not present

## 2017-01-17 DIAGNOSIS — R079 Chest pain, unspecified: Secondary | ICD-10-CM | POA: Diagnosis not present

## 2017-01-17 DIAGNOSIS — K5792 Diverticulitis of intestine, part unspecified, without perforation or abscess without bleeding: Secondary | ICD-10-CM | POA: Diagnosis not present

## 2017-01-17 DIAGNOSIS — E782 Mixed hyperlipidemia: Secondary | ICD-10-CM | POA: Diagnosis not present

## 2017-01-17 NOTE — Progress Notes (Signed)
CARDIOLOGY OFFICE NOTE  Date:  01/18/2017      Patricia [name] Date of Birth: Oct 08, 1951 Medical Record #[id number]  PCP:  [name], MD  Cardiologist:  [facility]  Chief Complaint  Patient presents with  . Chest Pain    Work in visit - seen for Dr. Marlou Porch    History of Present Illness: Patricia [name] is a 66 y.o. female who presents today for a follow up/work in visit. Seen for Dr. Marlou Porch.   She has a known thoracic aortic dilation. Other issues include HTN and HLD. Last echo from 03/2016. Aorta measured 4m.   Last seen back last May by Dr. SMarlou Porch- was not taking her statin.   Asked to see back here by her primary care for evaluation of an abnormal EKG and chest pain.   Comes in today. Here alone. She notes that she has "had this chest pain" for many years - but now getting more frequent. Over the past 3 to 4 months she has had associated right jaw pain - it comes and goes - will lasts for several minutes - no other associated symptoms - not exertional - always happens at rest. She has not exercised over the past year due to past foot surgery that did not go well. She has gained weight. She is a former smoker. She does not check her BP at home but has a cuff. Says she does not know how to use it. She was at GI yesterday for a diverticulitis flare - BP very high - then went on to see Dr. EMarisue Humble- Bp was up - Norvasc was added yesterday. EKG there with anteroseptal ST/T wave changes. She is currently painfree here today. She notes that the pain moves around in her chest - goes thru to her back and has been up to the right jaw.   Past Medical History:  Diagnosis Date  . Aneurysm of aorta (HCC)   . Arthritis   . Atrophic vaginitis   . Depression   . Diverticulitis   . Edema of both legs    She takes Lasix a couple of times per week to decrease swelling.    . Fatty liver   . Hyperlipidemia   . Hypertension   . Memory difficulty   . Obesity   . Reflux   .  Shingles     Past Surgical History:  Procedure Laterality Date  . ABDOMINAL HYSTERECTOMY    . AUGMENTATION MAMMAPLASTY    . Basal and Squamous cell cancers excised    . CESAREAN SECTION     X 2  . KNEE SURGERY       Medications: Current Outpatient Prescriptions  Medication Sig Dispense Refill  . ALPRAZolam (XANAX) 0.5 MG tablet Take 0.5 mg by mouth as needed for anxiety. For travel    . amLODipine (NORVASC) 5 MG tablet Take 5 mg by mouth daily.    .Marland Kitchenaspirin EC 81 MG tablet Take 81 mg by mouth daily.    . B Complex Vitamins (VITAMIN B COMPLEX 100) INJ Inject 100 mg as directed every 14 (fourteen) days.    .Marland KitchenBIOTIN PO Take 2 tablets by mouth daily. 3 times daily    . ciprofloxacin (CIPRO) 500 MG tablet Take 1 tablet (500 mg total) by mouth 2 (two) times daily. One po bid x 7 days 14 tablet 0  . dicyclomine (BENTYL) 20 MG tablet Take 20 mg by mouth as needed for spasms (diverticulitis).    .Marland Kitchen  esomeprazole (NEXIUM) 20 MG capsule Take 20 mg by mouth as needed (heartburn/diverticulitis).    . metroNIDAZOLE (FLAGYL) 500 MG tablet Take 1 tablet (500 mg total) by mouth 3 (three) times daily. One po bid x 7 days 21 tablet 0  . naproxen sodium (ANAPROX) 220 MG tablet Take 220 mg by mouth as needed (headache/diverticulitis).    . polyethylene glycol (MIRALAX / GLYCOLAX) packet Take 8.4 g by mouth daily.     . pravastatin (PRAVACHOL) 20 MG tablet Take 20 mg by mouth daily.    Marland Kitchen triamterene-hydrochlorothiazide (MAXZIDE-25) 37.5-25 MG per tablet Take 1 tablet by mouth daily.       No current facility-administered medications for this visit.     Allergies: Allergies  Allergen Reactions  . Morphine     REACTION: itch  . Tetracyclines & Related Other (See Comments)    Stomach pain    Social History: The patient  reports that she has quit smoking. She has a 25.00 pack-year smoking history. She has never used smokeless tobacco. She reports that she drinks alcohol. She reports that she does not  use drugs.   Family History: The patient's family history includes Alzheimer's disease in her father; Bipolar disorder in her sister; Breast cancer in her cousin; Cancer in her paternal grandmother; Depression in her sister; Diabetes in her mother; Diverticulitis in her maternal grandmother, paternal grandfather, and paternal grandmother; Heart disease in her mother; Hyperlipidemia in her mother; Hypertension in her father and mother; Liver cancer in her paternal grandfather; Uterine cancer in her mother. Her mom is 44 and has had prior PCI. Father died with Alzheimer's.   Review of Systems: Please see the history of present illness.   Otherwise, the review of systems is positive for none.   All other systems are reviewed and negative.   Physical Exam: VS:  BP (!) 136/100   Pulse 81   Ht 5' 4"  (1.626 m)   Wt 188 lb 1.9 oz (85.3 kg)   BMI 32.29 kg/m  .  BMI Body mass index is 32.29 kg/m.  Wt Readings from Last 3 Encounters:  01/18/17 188 lb 1.9 oz (85.3 kg)  09/07/16 189 lb (85.7 kg)  09/04/16 188 lb (85.3 kg)    General: Pleasant. Well developed, well nourished and in no acute distress.  She is obese.  HEENT: Normal.  Neck: Supple, no JVD, carotid bruits, or masses noted.  Cardiac: Regular rate and rhythm. No murmurs, rubs, or gallops. No edema. Pulses equal bilaterally. BP same in both arms.  Respiratory:  Lungs are clear to auscultation bilaterally with normal work of breathing.  GI: Soft and nontender.  MS: No deformity or atrophy. Gait and ROM intact.  Skin: Warm and dry. Color is normal.  Neuro:  Strength and sensation are intact and no gross focal deficits noted.  Psych: Alert, appropriate and with normal affect.   LABORATORY DATA:  EKG:  EKG is ordered today. This demonstrates NSR and is normal here today.  Lab Results  Component Value Date   WBC 9.5 08/20/2016   HGB 14.0 08/20/2016   HCT 41.1 08/20/2016   PLT 268 08/20/2016   GLUCOSE 113 (H) 08/20/2016   ALT 28  08/20/2016   AST 29 08/20/2016   NA 137 08/20/2016   K 3.6 08/20/2016   CL 102 08/20/2016   CREATININE 0.81 08/20/2016   BUN 8 08/20/2016   CO2 23 08/20/2016    BNP (last 3 results) No results for input(s): BNP in the  last 8760 hours.  ProBNP (last 3 results) No results for input(s): PROBNP in the last 8760 hours.   Other Studies Reviewed Today:  Echo Study Conclusions from 03/2016  - Left ventricle: The cavity size was normal. Doppler parameters   are consistent with abnormal left ventricular relaxation (grade 1   diastolic dysfunction). - Mitral valve: There was mild regurgitation.  Assessment/Plan: 1. Chest pain - EKG with anteroseptal changes yesterday - normal today. She is painfree. Has multiple CV risk factors. Arranging for CT angio of the chest and Lexiscan Myoview. Further disposition to follow.   2. Thoracic aneurysm/dilated aortic root - advised good BP control - Norvasc added yesterday - would hold on additional medicines at this time but will need to follow. Repeat BP in both arms by me is 130/100 as well. With her chest pain radiating to the back will arrange for CT angio.   3. HTN - needs better control - Norvasc added yesterday. She will need instruction in how to take her BP with her cuff. Getting stat BMET today.   4. HLD - on statin - lipids were drawn yesterday  5. Current flare of diverticulitis  6. Obesity  Current medicines are reviewed with the patient today.  The patient does not have concerns regarding medicines other than what has been noted above.  The following changes have been made:  See above.  Labs/ tests ordered today include:    Orders Placed This Encounter  Procedures  . EKG 12-Lead     Disposition:   Further disposition pending.    Patient is agreeable to this plan and will call if any problems develop in the interim.   SignedTruitt Merle, NP  01/18/2017 10:58 AM  [facility] [address] [place], [place]  [address] Phone: [phone] Fax: [phone]

## 2017-01-17 NOTE — Telephone Encounter (Signed)
ERROR

## 2017-01-18 ENCOUNTER — Ambulatory Visit (INDEPENDENT_AMBULATORY_CARE_PROVIDER_SITE_OTHER)
Admission: RE | Admit: 2017-01-18 | Discharge: 2017-01-18 | Disposition: A | Discharge status: Home / Self Care | Payer: Medicare Other | Source: Ambulatory Visit | Attending: Nurse Practitioner | Admitting: Nurse Practitioner

## 2017-01-18 ENCOUNTER — Ambulatory Visit (INDEPENDENT_AMBULATORY_CARE_PROVIDER_SITE_OTHER): Payer: Medicare Other | Admitting: Nurse Practitioner

## 2017-01-18 ENCOUNTER — Encounter: Payer: Self-pay | Admitting: Nurse Practitioner

## 2017-01-18 VITALS — BP 136/100 | HR 81 | Ht 64.0 in | Wt 188.1 lb

## 2017-01-18 DIAGNOSIS — I712 Thoracic aortic aneurysm, without rupture, unspecified: Secondary | ICD-10-CM

## 2017-01-18 DIAGNOSIS — I10 Essential (primary) hypertension: Secondary | ICD-10-CM

## 2017-01-18 DIAGNOSIS — R0789 Other chest pain: Secondary | ICD-10-CM | POA: Diagnosis not present

## 2017-01-18 DIAGNOSIS — R079 Chest pain, unspecified: Secondary | ICD-10-CM | POA: Diagnosis not present

## 2017-01-18 LAB — BASIC METABOLIC PANEL
BUN/Creatinine Ratio: 21 (ref 12–28)
BUN: 13 mg/dL (ref 8–27)
CO2: 31 mmol/L — ABNORMAL HIGH (ref 18–29)
Calcium: 9.5 mg/dL (ref 8.7–10.3)
Chloride: 102 mmol/L (ref 96–106)
Creatinine, Ser: 0.62 mg/dL (ref 0.57–1.00)
GFR calc Af Amer: 109 mL/min/{1.73_m2} (ref >59)
GFR calc non Af Amer: 94 mL/min/{1.73_m2} (ref >59)
Glucose: 92 mg/dL (ref 65–99)
Potassium: 4.2 mmol/L (ref 3.5–5.2)
Sodium: 137 mmol/L (ref 134–144)

## 2017-01-18 NOTE — Patient Instructions (Addendum)
We will be checking the following labs today - STAT BMET   Medication Instructions:    Continue with your current medicines.     Testing/Procedures To Be Arranged:  CT angio of the chest - known thoracic aneurysm/dilated aortic root.   Lexiscan Myoview  Follow-Up:   See Dr. Marlou Porch for follow up   Other Special Instructions:   N/A    If you need a refill on your cardiac medications before your next appointment, please call your pharmacy.   Call the [place] office at [phone] if you have any questions, problems or concerns.

## 2017-01-19 ENCOUNTER — Telehealth: Payer: Self-pay | Admitting: Nurse Practitioner

## 2017-01-19 NOTE — Telephone Encounter (Signed)
Mrs. Bessire is returning your call.   Thanks

## 2017-01-20 ENCOUNTER — Telehealth (HOSPITAL_COMMUNITY): Payer: Self-pay | Admitting: *Deleted

## 2017-01-20 NOTE — Telephone Encounter (Signed)
Patient given detailed instructions per Myocardial Perfusion Study Information Sheet for the test on 01/25/17. Patient notified to arrive 15 minutes early and that it is imperative to arrive on time for appointment to keep from having the test rescheduled.  If you need to cancel or reschedule your appointment, please call the office within 24 hours of your appointment. Failure to do so may result in a cancellation of your appointment, and a $50 no show fee. Patient verbalized understanding. Kirstie Peri

## 2017-01-25 ENCOUNTER — Ambulatory Visit (HOSPITAL_COMMUNITY): Payer: Medicare Other | Attending: Internal Medicine

## 2017-01-25 DIAGNOSIS — R0789 Other chest pain: Secondary | ICD-10-CM | POA: Diagnosis not present

## 2017-01-25 DIAGNOSIS — I10 Essential (primary) hypertension: Secondary | ICD-10-CM | POA: Diagnosis not present

## 2017-01-25 DIAGNOSIS — I712 Thoracic aortic aneurysm, without rupture, unspecified: Secondary | ICD-10-CM

## 2017-01-25 LAB — MYOCARDIAL PERFUSION IMAGING
LV dias vol: 54 mL (ref 46–106)
LV sys vol: 17 mL
Peak HR: 115 {beats}/min
RATE: 0.19
Rest HR: 82 {beats}/min
SDS: 3
SRS: 4
SSS: 6
TID: 0.74

## 2017-01-25 MED ORDER — TECHNETIUM TC 99M TETROFOSMIN IV KIT
31.6000 | PACK | Freq: Once | INTRAVENOUS | Status: AC | PRN
  Administered 2017-01-25: 31.6 via INTRAVENOUS
  Filled 2017-01-25: qty 32

## 2017-01-25 MED ORDER — TECHNETIUM TC 99M TETROFOSMIN IV KIT
10.2000 | PACK | Freq: Once | INTRAVENOUS | Status: AC | PRN
  Administered 2017-01-25: 10.2 via INTRAVENOUS
  Filled 2017-01-25: qty 11

## 2017-01-25 MED ORDER — REGADENOSON 0.4 MG/5ML IV SOLN
0.4000 mg | Freq: Once | INTRAVENOUS | Status: AC
  Administered 2017-01-25: 0.4 mg via INTRAVENOUS

## 2017-02-04 DIAGNOSIS — D2272 Melanocytic nevi of left lower limb, including hip: Secondary | ICD-10-CM | POA: Diagnosis not present

## 2017-02-04 DIAGNOSIS — D1801 Hemangioma of skin and subcutaneous tissue: Secondary | ICD-10-CM | POA: Diagnosis not present

## 2017-02-04 DIAGNOSIS — L812 Freckles: Secondary | ICD-10-CM | POA: Diagnosis not present

## 2017-02-04 DIAGNOSIS — D485 Neoplasm of uncertain behavior of skin: Secondary | ICD-10-CM | POA: Diagnosis not present

## 2017-02-04 DIAGNOSIS — L814 Other melanin hyperpigmentation: Secondary | ICD-10-CM | POA: Diagnosis not present

## 2017-02-04 DIAGNOSIS — Z85828 Personal history of other malignant neoplasm of skin: Secondary | ICD-10-CM | POA: Diagnosis not present

## 2017-02-04 DIAGNOSIS — L820 Inflamed seborrheic keratosis: Secondary | ICD-10-CM | POA: Diagnosis not present

## 2017-02-04 DIAGNOSIS — D2261 Melanocytic nevi of right upper limb, including shoulder: Secondary | ICD-10-CM | POA: Diagnosis not present

## 2017-02-04 DIAGNOSIS — L570 Actinic keratosis: Secondary | ICD-10-CM | POA: Diagnosis not present

## 2017-02-04 DIAGNOSIS — L821 Other seborrheic keratosis: Secondary | ICD-10-CM | POA: Diagnosis not present

## 2017-02-04 DIAGNOSIS — L858 Other specified epidermal thickening: Secondary | ICD-10-CM | POA: Diagnosis not present

## 2017-02-04 DIAGNOSIS — D225 Melanocytic nevi of trunk: Secondary | ICD-10-CM | POA: Diagnosis not present

## 2017-02-04 DIAGNOSIS — D2271 Melanocytic nevi of right lower limb, including hip: Secondary | ICD-10-CM | POA: Diagnosis not present

## 2017-02-07 DIAGNOSIS — M170 Bilateral primary osteoarthritis of knee: Secondary | ICD-10-CM | POA: Diagnosis not present

## 2017-02-07 DIAGNOSIS — M25561 Pain in right knee: Secondary | ICD-10-CM | POA: Diagnosis not present

## 2017-02-07 DIAGNOSIS — M25562 Pain in left knee: Secondary | ICD-10-CM | POA: Diagnosis not present

## 2017-02-17 DIAGNOSIS — I10 Essential (primary) hypertension: Secondary | ICD-10-CM | POA: Diagnosis not present

## 2017-02-17 DIAGNOSIS — E669 Obesity, unspecified: Secondary | ICD-10-CM | POA: Diagnosis not present

## 2017-02-17 DIAGNOSIS — Z6831 Body mass index (BMI) 31.0-31.9, adult: Secondary | ICD-10-CM | POA: Diagnosis not present

## 2017-03-02 ENCOUNTER — Ambulatory Visit: Payer: Medicare Other | Admitting: Cardiology

## 2017-03-09 ENCOUNTER — Other Ambulatory Visit: Payer: Self-pay | Admitting: Physician Assistant

## 2017-03-09 DIAGNOSIS — R1032 Left lower quadrant pain: Secondary | ICD-10-CM | POA: Diagnosis not present

## 2017-03-09 DIAGNOSIS — G8918 Other acute postprocedural pain: Secondary | ICD-10-CM

## 2017-03-09 DIAGNOSIS — K5730 Diverticulosis of large intestine without perforation or abscess without bleeding: Secondary | ICD-10-CM | POA: Diagnosis not present

## 2017-03-10 ENCOUNTER — Ambulatory Visit
Admission: RE | Admit: 2017-03-10 | Discharge: 2017-03-10 | Disposition: A | Discharge status: Home / Self Care | Payer: Medicare Other | Source: Ambulatory Visit | Attending: Physician Assistant | Admitting: Physician Assistant

## 2017-03-10 DIAGNOSIS — R1032 Left lower quadrant pain: Principal | ICD-10-CM

## 2017-03-10 DIAGNOSIS — K5730 Diverticulosis of large intestine without perforation or abscess without bleeding: Secondary | ICD-10-CM | POA: Diagnosis not present

## 2017-03-10 DIAGNOSIS — G8918 Other acute postprocedural pain: Secondary | ICD-10-CM

## 2017-03-10 MED ORDER — IOPAMIDOL (ISOVUE-300) INJECTION 61%
100.0000 mL | Freq: Once | INTRAVENOUS | Status: AC | PRN
  Administered 2017-03-10: 100 mL via INTRAVENOUS

## 2017-03-18 ENCOUNTER — Encounter (INDEPENDENT_AMBULATORY_CARE_PROVIDER_SITE_OTHER): Payer: Self-pay

## 2017-03-18 ENCOUNTER — Ambulatory Visit (INDEPENDENT_AMBULATORY_CARE_PROVIDER_SITE_OTHER): Payer: Medicare Other | Admitting: Cardiology

## 2017-03-18 VITALS — BP 134/80 | HR 108 | Ht 64.0 in | Wt 183.0 lb

## 2017-03-18 DIAGNOSIS — R002 Palpitations: Secondary | ICD-10-CM

## 2017-03-18 DIAGNOSIS — I77810 Thoracic aortic ectasia: Secondary | ICD-10-CM | POA: Diagnosis not present

## 2017-03-18 DIAGNOSIS — R0789 Other chest pain: Secondary | ICD-10-CM

## 2017-03-18 DIAGNOSIS — I10 Essential (primary) hypertension: Secondary | ICD-10-CM

## 2017-03-18 NOTE — Patient Instructions (Signed)
Medication Instructions:  The current medical regimen is effective;  continue present plan and medications.  Follow-Up: Follow up in 1 year with Dr. Marlou Porch.  You will receive a letter in the mail 2 months before you are due.  Please call us when you receive this letter to schedule your follow up appointment.  If you need a refill on your cardiac medications before your next appointment, please call your pharmacy.  Thank you for choosing [facility]!!

## 2017-03-18 NOTE — Progress Notes (Signed)
Cardiology Office Note    Date:  03/18/2017   ID:  [name], DOB 1951-08-14, MRN [id number]  PCP:  [name], MD  Cardiologist:   [name], MD     History of Present Illness:  Patricia [name] is a 66 y.o. female here for the evaluation of thoracic aortic dilation. It is been close to 3 years since our last follow-up. LDL cholesterol also elevated at 185. She has not been taking simvastatin different points. 4.1 cm ascending aorta 06/14/13 aortic root was 4.3 cm.  She feels some dyspnea on exertion with stairs for instance which is been quite chronic for her. She also feels a fluttery like sensation in her chest which originally she described as chest pressure but tends secondarily described as a bird fluttering in her chest. This lasted a few seconds duration, relieved without any other interventions. She can go weeks upon weeks without this sensation and then all of a sudden it will come out of nowhere. This does not cause any syncope or dizziness.  Spring 2016 - Rabies shots.  Right foot surgery - open wound, Lipoma   03/18/17-recent cardiac workup has been reassuring with stress test and CT scan. Echocardiogram correlates with CT scan 4.3 cm aortic root. Mild mitral regurgitation noted. She will occasionally have chest discomfort radiating to her back and sometimes up to her right lower jaw. She takes a Tums, lays down and usually it subsides. She has seen GI in the past. Unfortunately it is hard to distinctly symptoms and last time when she had the symptoms and extensive evaluation to place as above and was reassuring.  Past Medical History:  Diagnosis Date  . Aneurysm of aorta (HCC)   . Arthritis   . Atrophic vaginitis   . Depression   . Diverticulitis   . Edema of both legs    She takes Lasix a couple of times per week to decrease swelling.    . Fatty liver   . Hyperlipidemia   . Hypertension   . Memory difficulty   . Obesity   . Reflux   . Shingles       Past Surgical History:  Procedure Laterality Date  . ABDOMINAL HYSTERECTOMY    . AUGMENTATION MAMMAPLASTY    . Basal and Squamous cell cancers excised    . CESAREAN SECTION     X 2  . KNEE SURGERY      Current Medications: Outpatient Medications Prior to Visit  Medication Sig Dispense Refill  . ALPRAZolam (XANAX) 0.5 MG tablet Take 0.5 mg by mouth as needed for anxiety. For travel    . amLODipine (NORVASC) 5 MG tablet Take 5 mg by mouth daily.    Marland Kitchen aspirin EC 81 MG tablet Take 81 mg by mouth daily.    . B Complex Vitamins (VITAMIN B COMPLEX 100) INJ Inject 100 mg as directed every 14 (fourteen) days.    Marland Kitchen BIOTIN PO Take 2 tablets by mouth daily. 3 times daily    . polyethylene glycol (MIRALAX / GLYCOLAX) packet Take 8.4 g by mouth daily.     Marland Kitchen triamterene-hydrochlorothiazide (MAXZIDE-25) 37.5-25 MG per tablet Take 1 tablet by mouth daily.      . ciprofloxacin (CIPRO) 500 MG tablet Take 1 tablet (500 mg total) by mouth 2 (two) times daily. One po bid x 7 days 14 tablet 0  . dicyclomine (BENTYL) 20 MG tablet Take 20 mg by mouth as needed for spasms (diverticulitis).    Marland Kitchen  esomeprazole (NEXIUM) 20 MG capsule Take 20 mg by mouth as needed (heartburn/diverticulitis).    . metroNIDAZOLE (FLAGYL) 500 MG tablet Take 1 tablet (500 mg total) by mouth 3 (three) times daily. One po bid x 7 days 21 tablet 0  . naproxen sodium (ANAPROX) 220 MG tablet Take 220 mg by mouth as needed (headache/diverticulitis).    . pravastatin (PRAVACHOL) 20 MG tablet Take 20 mg by mouth daily.     No facility-administered medications prior to visit.      Allergies:   Morphine and Tetracyclines & related   Social History   Social History  . Marital status: Married    Spouse name: [name]  . Number of children: 2  . Years of education: 12   Occupational History  . 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 Smoking status: Former Smoker    Packs/day: 1.00     Years: 25.00  . Smokeless tobacco: Never Used  . Alcohol use 0.0 oz/week     Comment: Rare  . Drug use: No  . Sexual activity: Yes    Birth control/ protection: Surgical     Comment: HYST-1st intercourse 66 yo-Fewer than 5 partners   Other Topics Concern  . Not on file   Social History Narrative   Marital Status: Married Engineer, technical sales)   Children:  Sons (2)    Pets: Cat    Living Situation: Lives with husband    Occupation: Therapist, occupational - [employer]   Education: Programmer, systems    Tobacco Use/Exposure:  None    Alcohol Use:  Occasional   Drug Use:  None   Diet:  Regular   Exercise:  Bikram Yoga    Hobbies: Gardening/ Reading   Patient is right handed                 Family History:  The patient's family history includes Alzheimer's disease in her father; Bipolar disorder in her sister; Breast cancer in her cousin; Cancer in her paternal grandmother; Depression in her sister; Diabetes in her mother; Diverticulitis in her maternal grandmother, paternal grandfather, and paternal grandmother; Heart disease in her mother; Hyperlipidemia in her mother; Hypertension in her father and mother; Liver cancer in her paternal grandfather; Uterine cancer in her mother.   ROS:   Please see the history of present illness.    ROS denies any syncope, bruising, orthopnea, PND   PHYSICAL EXAM:   VS:  BP 134/80   Pulse (!) 108   Ht 5' 4"  (1.626 m)   Wt 183 lb (83 kg)   BMI 31.41 kg/m    GEN: Well nourished, well developed, in no acute distress  HEENT: normal  Neck: no JVD, carotid bruits, or masses Cardiac: RRR; 1/6 SM, no rubs, or gallops,no edema  Respiratory:  clear to auscultation bilaterally, normal work of breathing GI: soft, nontender, nondistended, + BS MS: no deformity or atrophy  Skin: warm and dry, no rash Neuro:  Alert and Oriented x 3, Strength and sensation are intact Psych: euthymic mood, full affect   Wt Readings from Last 3 Encounters:    03/18/17 183 lb (83 kg)  01/18/17 188 lb 1.9 oz (85.3 kg)  09/07/16 189 lb (85.7 kg)      Studies/Labs Reviewed:   EKG:  EKG is ordered today.  03/17/16-sinus rhythm, 68, no other abnormalities. Personally viewed  Recent Labs: 08/20/2016: ALT 28; Hemoglobin 14.0; Platelets 268  01/18/2017: BUN 13; Creatinine, Ser 0.62; Potassium 4.2; Sodium 137   Lipid Panel No results found for: CHOL, TRIG, HDL, CHOLHDL, VLDL, LDLCALC, LDLDIRECT  Additional studies/ records that were reviewed today include:  Prior office notes reviewed.  Stress test, low risk 7 minutes 2010 Nuclear stress test on 01/25/17  Nuclear stress EF: 69%.  There was no ST segment deviation noted during stress.  The study is normal.  The left ventricular ejection fraction is hyperdynamic (>65%).   Normal stress nuclear study with no ischemia or infarction; EF 69 with normal wall motion.  CT scan of chest on 01/18/17-4.3 cm aorta.  Echocardiogram reassuring EF. Last aortic root 4.3 cm. 04/05/16 with mild mitral regurgitation      ASSESSMENT:    1. Dilated aortic root ([place])   2. Essential hypertension   3. Palpitations   4. Other chest pain      PLAN:  In order of problems listed above:  Dilated aortic root  -Has been stable at 4.3 cm. We will monitor. Blood pressure under good control. Continue to exercise.  Essential hypertension  - Triamterene hydrochlorothiazide. Stable. No changes  Dependent edema  - Notices after plane flight.  - Encourage leg elevation, calf exercises, compression hose. No changes  Hyperlipidemia  - LDL 185 off of medication. Discussed merits of statin use. Does not wish to utilize. Understands risks.  Palpitations  - Fluttering in her chest, periodic, lasting a few seconds, described as chest pressure but actually feels like a bird fluttering in her chest. This is likely PAC/PAT. No high-risk symptoms such as syncope associated with this. Continue with exercise. EKG  reassuring. Prior exercise treadmill test reassuring. Prior nuclear stress test reassuring.  Atypical chest pain  - Reassurance. Stress test as well as CT scan reassuring. No change in aortic size. Unfortunately with her clinical history it is hard for discern between GI/possible cardiac and if she says chest pain radiating to her back and slightly up to her jaw this will throw up red flags.  Medication Adjustments/Labs and Tests Ordered: Current medicines are reviewed at length with the patient today.  Concerns regarding medicines are outlined above.  Medication changes, Labs and Tests ordered today are listed in the Patient Instructions below. Patient Instructions  Medication Instructions:  The current medical regimen is effective;  continue present plan and medications.  Follow-Up: Follow up in 1 year with Dr. Marlou Porch.  You will receive a letter in the mail 2 months before you are due.  Please call us when you receive this letter to schedule your follow up appointment.  If you need a refill on your cardiac medications before your next appointment, please call your pharmacy.  Thank you for choosing [facility]!!        Signed, [name], MD  03/18/2017 1:49 PM    [facility] [place], [place], [place]  [address] Phone: [phone]; Fax: [phone]

## 2017-03-21 DIAGNOSIS — R1032 Left lower quadrant pain: Secondary | ICD-10-CM | POA: Diagnosis not present

## 2017-03-21 DIAGNOSIS — Z86010 Personal history of colonic polyps: Secondary | ICD-10-CM | POA: Diagnosis not present

## 2017-03-29 ENCOUNTER — Telehealth: Payer: Self-pay | Admitting: *Deleted

## 2017-03-29 ENCOUNTER — Encounter: Payer: Self-pay | Admitting: Gynecology

## 2017-03-29 ENCOUNTER — Ambulatory Visit (INDEPENDENT_AMBULATORY_CARE_PROVIDER_SITE_OTHER): Payer: Medicare Other | Admitting: Gynecology

## 2017-03-29 VITALS — BP 120/82 | Ht 64.0 in | Wt 183.0 lb

## 2017-03-29 DIAGNOSIS — N898 Other specified noninflammatory disorders of vagina: Secondary | ICD-10-CM | POA: Diagnosis not present

## 2017-03-29 DIAGNOSIS — N952 Postmenopausal atrophic vaginitis: Secondary | ICD-10-CM | POA: Diagnosis not present

## 2017-03-29 DIAGNOSIS — H01005 Unspecified blepharitis left lower eyelid: Secondary | ICD-10-CM | POA: Diagnosis not present

## 2017-03-29 DIAGNOSIS — Z01411 Encounter for gynecological examination (general) (routine) with abnormal findings: Secondary | ICD-10-CM | POA: Diagnosis not present

## 2017-03-29 DIAGNOSIS — N941 Unspecified dyspareunia: Secondary | ICD-10-CM | POA: Diagnosis not present

## 2017-03-29 DIAGNOSIS — H01004 Unspecified blepharitis left upper eyelid: Secondary | ICD-10-CM | POA: Diagnosis not present

## 2017-03-29 DIAGNOSIS — B0052 Herpesviral keratitis: Secondary | ICD-10-CM | POA: Diagnosis not present

## 2017-03-29 LAB — WET PREP FOR TRICH, YEAST, CLUE
Clue Cells Wet Prep HPF POC: NONE SEEN
Trich, Wet Prep: NONE SEEN
Yeast Wet Prep HPF POC: NONE SEEN

## 2017-03-29 MED ORDER — NONFORMULARY OR COMPOUNDED ITEM: 3 refills | Status: DC

## 2017-03-29 NOTE — Progress Notes (Signed)
    Patricia [name] 06/18/1951 [id number]        65 y.o.  G2P2002 for breast and pelvic exam. Patient also complaining for months of vaginal irritation, dryness and discomfort with intercourse such that she is no longer having intercourse. No discharge or odor. No urinary symptoms such as frequency dysuria or urgency.  Past medical history,surgical history, problem list, medications, allergies, family history and social history were all reviewed and documented as reviewed in the EPIC chart.  ROS:  Performed with pertinent positives and negatives included in the history, assessment and plan.   Additional significant findings :  None   Exam: Caryn Bee assistant Vitals:   03/29/17 1035  BP: 120/82  Weight: 183 lb (83 kg)  Height: 5' 4"  (1.626 m)   Body mass index is 31.41 kg/m.  General appearance:  Normal affect, orientation and appearance. Skin: Grossly normal HEENT: Without gross lesions.  No cervical or supraclavicular adenopathy. Thyroid normal.  Lungs:  Clear without wheezing, rales or rhonchi Cardiac: RR, without RMG Abdominal:  Soft, nontender, without masses, guarding, rebound, organomegaly or hernia Breasts:  Examined lying and sitting without masses, retractions, discharge or axillary adenopathy.  Bilateral implants noted Pelvic:  Ext, BUS, Vagina: With atrophic changes. No significant discharge or inflammatory changes  Adnexa: Without masses or tenderness    Anus and perineum: Normal   Rectovaginal: Normal sphincter tone without palpated masses or tenderness.    Assessment/Plan:  66 y.o. [id number] female for breast and pelvic exam.   1. Postmenopausal/atrophic genital changes. Having issues with vaginal dryness, irritation and dyspareunia. No significant hot flushes or night sweats. Wet prep is negative today for infectious etiology. Reviewed the issues of atrophic changes and options to include OTC products such as lubricants and moisturizers as well as vaginal  estrogen supplementation. Benefits of vaginal estrogen discussed. Risks to include absorption with systemic effects such as increased thrombosis risk, stroke heart attack DVT and breast stimulation issues. At this point the patient wants trial of vaginal estrogen. Reviewed options to include Vagifem, vaginal estrogen creams, Osphena as well as Josph [place] laser. Will start with estradiol [facility] weekly. Will follow up if still an issue after 1 month of using. Refill 1 year provided. 2. Mammography 03/2016. Continue with annual mammography now and patient will schedule. SBE monthly reviewed. Breast exam normal today. 3. Pap smear 2012. No Pap smear done today. No history of abnormal Pap smears. We both agree to stop screening per current screening guidelines based on age and hysterectomy history. 4. DEXA 2011 normal. Plan repeat DEXA now and patient will schedule. 5. Colonoscopy 2015. Being followed for history of diverticulitis and she'll continue to follow up with her gastroenterologist in reference to this. 6. Health maintenance. No routine lab work done as patient does this elsewhere. Follow up in one year, sooner as needed.  Additional time in excess of her breast and pelvic exam was spent in direct face to face counseling and coordination of care in regards to her atrophic vaginitis and symptomatic dyspareunia with treatment options reviewed and ultimate prescription provided.    Anastasio Auerbach MD, 11:24 AM 03/29/2017

## 2017-03-29 NOTE — Patient Instructions (Signed)
Start the vaginal estrogen cream twice weekly.  Follow up for the bone density as schedule

## 2017-03-29 NOTE — Telephone Encounter (Signed)
-----   Message from Anastasio Auerbach, MD sent at 03/29/2017 11:13 AM EDT ----- [facility] estradiol vaginal cream refilled syringes 3 month supply sig: Use twice weekly with refill 1 year

## 2017-03-29 NOTE — Telephone Encounter (Signed)
Rx called in 

## 2017-04-01 DIAGNOSIS — H04123 Dry eye syndrome of bilateral lacrimal glands: Secondary | ICD-10-CM | POA: Diagnosis not present

## 2017-04-01 DIAGNOSIS — B0052 Herpesviral keratitis: Secondary | ICD-10-CM | POA: Diagnosis not present

## 2017-04-04 DIAGNOSIS — L298 Other pruritus: Secondary | ICD-10-CM | POA: Diagnosis not present

## 2017-04-04 DIAGNOSIS — E782 Mixed hyperlipidemia: Secondary | ICD-10-CM | POA: Diagnosis not present

## 2017-04-04 DIAGNOSIS — E669 Obesity, unspecified: Secondary | ICD-10-CM | POA: Diagnosis not present

## 2017-04-04 DIAGNOSIS — K7581 Nonalcoholic steatohepatitis (NASH): Secondary | ICD-10-CM | POA: Diagnosis not present

## 2017-04-04 DIAGNOSIS — I10 Essential (primary) hypertension: Secondary | ICD-10-CM | POA: Diagnosis not present

## 2017-04-11 DIAGNOSIS — H18832 Recurrent erosion of cornea, left eye: Secondary | ICD-10-CM | POA: Diagnosis not present

## 2017-04-12 DIAGNOSIS — Z1231 Encounter for screening mammogram for malignant neoplasm of breast: Secondary | ICD-10-CM | POA: Diagnosis not present

## 2017-04-14 DIAGNOSIS — N6001 Solitary cyst of right breast: Secondary | ICD-10-CM | POA: Diagnosis not present

## 2017-05-05 DIAGNOSIS — Z961 Presence of intraocular lens: Secondary | ICD-10-CM | POA: Diagnosis not present

## 2017-05-05 DIAGNOSIS — H5213 Myopia, bilateral: Secondary | ICD-10-CM | POA: Diagnosis not present

## 2017-06-17 DIAGNOSIS — K635 Polyp of colon: Secondary | ICD-10-CM | POA: Diagnosis not present

## 2017-06-17 DIAGNOSIS — K5730 Diverticulosis of large intestine without perforation or abscess without bleeding: Secondary | ICD-10-CM | POA: Diagnosis not present

## 2017-06-17 DIAGNOSIS — Z86010 Personal history of colonic polyps: Secondary | ICD-10-CM | POA: Diagnosis not present

## 2017-06-17 DIAGNOSIS — K640 First degree hemorrhoids: Secondary | ICD-10-CM | POA: Diagnosis not present

## 2017-06-21 DIAGNOSIS — K635 Polyp of colon: Secondary | ICD-10-CM | POA: Diagnosis not present

## 2017-07-19 DIAGNOSIS — M1711 Unilateral primary osteoarthritis, right knee: Secondary | ICD-10-CM | POA: Diagnosis not present

## 2017-07-19 DIAGNOSIS — M170 Bilateral primary osteoarthritis of knee: Secondary | ICD-10-CM | POA: Diagnosis not present

## 2017-07-19 DIAGNOSIS — M1712 Unilateral primary osteoarthritis, left knee: Secondary | ICD-10-CM | POA: Diagnosis not present

## 2017-07-19 DIAGNOSIS — M25561 Pain in right knee: Secondary | ICD-10-CM | POA: Diagnosis not present

## 2017-07-19 DIAGNOSIS — M25562 Pain in left knee: Secondary | ICD-10-CM | POA: Diagnosis not present

## 2017-07-25 DIAGNOSIS — Z23 Encounter for immunization: Secondary | ICD-10-CM | POA: Diagnosis not present

## 2017-10-06 DIAGNOSIS — I10 Essential (primary) hypertension: Secondary | ICD-10-CM | POA: Diagnosis not present

## 2017-10-06 DIAGNOSIS — E669 Obesity, unspecified: Secondary | ICD-10-CM | POA: Diagnosis not present

## 2017-10-06 DIAGNOSIS — Z0000 Encounter for general adult medical examination without abnormal findings: Secondary | ICD-10-CM | POA: Diagnosis not present

## 2017-10-06 DIAGNOSIS — I712 Thoracic aortic aneurysm, without rupture: Secondary | ICD-10-CM | POA: Diagnosis not present

## 2017-10-06 DIAGNOSIS — Z23 Encounter for immunization: Secondary | ICD-10-CM | POA: Diagnosis not present

## 2017-10-06 DIAGNOSIS — R319 Hematuria, unspecified: Secondary | ICD-10-CM | POA: Diagnosis not present

## 2017-10-06 DIAGNOSIS — F322 Major depressive disorder, single episode, severe without psychotic features: Secondary | ICD-10-CM | POA: Diagnosis not present

## 2017-10-06 DIAGNOSIS — E782 Mixed hyperlipidemia: Secondary | ICD-10-CM | POA: Diagnosis not present

## 2017-10-06 DIAGNOSIS — R8290 Unspecified abnormal findings in urine: Secondary | ICD-10-CM | POA: Diagnosis not present

## 2017-10-06 DIAGNOSIS — I868 Varicose veins of other specified sites: Secondary | ICD-10-CM | POA: Diagnosis not present

## 2017-10-06 DIAGNOSIS — K219 Gastro-esophageal reflux disease without esophagitis: Secondary | ICD-10-CM | POA: Diagnosis not present

## 2017-10-06 DIAGNOSIS — K7581 Nonalcoholic steatohepatitis (NASH): Secondary | ICD-10-CM | POA: Diagnosis not present

## 2017-10-31 ENCOUNTER — Other Ambulatory Visit: Payer: Self-pay | Admitting: Physician Assistant

## 2017-10-31 DIAGNOSIS — R1314 Dysphagia, pharyngoesophageal phase: Secondary | ICD-10-CM | POA: Diagnosis not present

## 2017-11-03 ENCOUNTER — Ambulatory Visit
Admission: RE | Admit: 2017-11-03 | Discharge: 2017-11-03 | Disposition: A | Discharge status: Home / Self Care | Payer: Medicare Other | Source: Ambulatory Visit | Attending: Physician Assistant | Admitting: Physician Assistant

## 2017-11-03 DIAGNOSIS — R1314 Dysphagia, pharyngoesophageal phase: Secondary | ICD-10-CM

## 2017-11-03 DIAGNOSIS — R1310 Dysphagia, unspecified: Secondary | ICD-10-CM | POA: Diagnosis not present

## 2017-11-08 ENCOUNTER — Other Ambulatory Visit: Payer: Self-pay | Admitting: Physician Assistant

## 2017-11-08 DIAGNOSIS — D179 Benign lipomatous neoplasm, unspecified: Secondary | ICD-10-CM

## 2017-11-09 ENCOUNTER — Ambulatory Visit
Admission: RE | Admit: 2017-11-09 | Discharge: 2017-11-09 | Disposition: A | Discharge status: Home / Self Care | Payer: Medicare Other | Source: Ambulatory Visit | Attending: Physician Assistant | Admitting: Physician Assistant

## 2017-11-09 DIAGNOSIS — N763 Subacute and chronic vulvitis: Secondary | ICD-10-CM | POA: Diagnosis not present

## 2017-11-09 DIAGNOSIS — D179 Benign lipomatous neoplasm, unspecified: Secondary | ICD-10-CM | POA: Diagnosis not present

## 2017-11-09 DIAGNOSIS — Z85828 Personal history of other malignant neoplasm of skin: Secondary | ICD-10-CM | POA: Diagnosis not present

## 2017-11-09 DIAGNOSIS — L570 Actinic keratosis: Secondary | ICD-10-CM | POA: Diagnosis not present

## 2017-11-09 DIAGNOSIS — L821 Other seborrheic keratosis: Secondary | ICD-10-CM | POA: Diagnosis not present

## 2017-11-24 ENCOUNTER — Other Ambulatory Visit: Payer: Self-pay | Admitting: Orthopedic Surgery

## 2017-11-24 ENCOUNTER — Ambulatory Visit (HOSPITAL_BASED_OUTPATIENT_CLINIC_OR_DEPARTMENT_OTHER)
Admission: RE | Admit: 2017-11-24 | Discharge: 2017-11-24 | Disposition: A | Discharge status: Home / Self Care | Payer: Medicare Other | Source: Ambulatory Visit | Attending: Orthopedic Surgery | Admitting: Orthopedic Surgery

## 2017-11-24 DIAGNOSIS — M79669 Pain in unspecified lower leg: Secondary | ICD-10-CM | POA: Diagnosis present

## 2017-11-24 DIAGNOSIS — M79662 Pain in left lower leg: Secondary | ICD-10-CM

## 2017-11-24 DIAGNOSIS — M7989 Other specified soft tissue disorders: Secondary | ICD-10-CM | POA: Insufficient documentation

## 2017-11-24 DIAGNOSIS — M1712 Unilateral primary osteoarthritis, left knee: Secondary | ICD-10-CM | POA: Diagnosis not present

## 2017-11-24 DIAGNOSIS — M25562 Pain in left knee: Secondary | ICD-10-CM

## 2017-11-24 DIAGNOSIS — M170 Bilateral primary osteoarthritis of knee: Secondary | ICD-10-CM | POA: Diagnosis not present

## 2017-11-24 DIAGNOSIS — M1711 Unilateral primary osteoarthritis, right knee: Secondary | ICD-10-CM | POA: Diagnosis not present

## 2017-11-24 DIAGNOSIS — R600 Localized edema: Secondary | ICD-10-CM | POA: Diagnosis not present

## 2017-11-25 DIAGNOSIS — F322 Major depressive disorder, single episode, severe without psychotic features: Secondary | ICD-10-CM | POA: Diagnosis not present

## 2017-11-29 DIAGNOSIS — R2242 Localized swelling, mass and lump, left lower limb: Secondary | ICD-10-CM | POA: Diagnosis not present

## 2017-12-02 DIAGNOSIS — Z139 Encounter for screening, unspecified: Secondary | ICD-10-CM | POA: Diagnosis not present

## 2017-12-05 DIAGNOSIS — M25562 Pain in left knee: Secondary | ICD-10-CM | POA: Diagnosis not present

## 2017-12-09 DIAGNOSIS — M25561 Pain in right knee: Secondary | ICD-10-CM | POA: Diagnosis not present

## 2017-12-09 DIAGNOSIS — K210 Gastro-esophageal reflux disease with esophagitis: Secondary | ICD-10-CM | POA: Diagnosis not present

## 2017-12-09 DIAGNOSIS — M25562 Pain in left knee: Secondary | ICD-10-CM | POA: Diagnosis not present

## 2017-12-09 DIAGNOSIS — K449 Diaphragmatic hernia without obstruction or gangrene: Secondary | ICD-10-CM | POA: Diagnosis not present

## 2017-12-09 DIAGNOSIS — K3189 Other diseases of stomach and duodenum: Secondary | ICD-10-CM | POA: Diagnosis not present

## 2017-12-09 DIAGNOSIS — K222 Esophageal obstruction: Secondary | ICD-10-CM | POA: Diagnosis not present

## 2017-12-09 DIAGNOSIS — R1310 Dysphagia, unspecified: Secondary | ICD-10-CM | POA: Diagnosis not present

## 2017-12-20 DIAGNOSIS — M25562 Pain in left knee: Secondary | ICD-10-CM | POA: Diagnosis not present

## 2017-12-22 DIAGNOSIS — M25562 Pain in left knee: Secondary | ICD-10-CM | POA: Diagnosis not present

## 2017-12-26 DIAGNOSIS — M25562 Pain in left knee: Secondary | ICD-10-CM | POA: Diagnosis not present

## 2017-12-29 DIAGNOSIS — M25562 Pain in left knee: Secondary | ICD-10-CM | POA: Diagnosis not present

## 2018-01-02 DIAGNOSIS — M25562 Pain in left knee: Secondary | ICD-10-CM | POA: Diagnosis not present

## 2018-01-09 DIAGNOSIS — J069 Acute upper respiratory infection, unspecified: Secondary | ICD-10-CM | POA: Diagnosis not present

## 2018-01-09 DIAGNOSIS — R05 Cough: Secondary | ICD-10-CM | POA: Diagnosis not present

## 2018-01-09 DIAGNOSIS — J209 Acute bronchitis, unspecified: Secondary | ICD-10-CM | POA: Diagnosis not present

## 2018-01-10 DIAGNOSIS — M25562 Pain in left knee: Secondary | ICD-10-CM | POA: Diagnosis not present

## 2018-01-12 DIAGNOSIS — D1724 Benign lipomatous neoplasm of skin and subcutaneous tissue of left leg: Secondary | ICD-10-CM | POA: Diagnosis not present

## 2018-01-12 DIAGNOSIS — Z01810 Encounter for preprocedural cardiovascular examination: Secondary | ICD-10-CM | POA: Diagnosis not present

## 2018-01-18 DIAGNOSIS — M25562 Pain in left knee: Secondary | ICD-10-CM | POA: Diagnosis not present

## 2018-01-31 DIAGNOSIS — D1724 Benign lipomatous neoplasm of skin and subcutaneous tissue of left leg: Secondary | ICD-10-CM | POA: Diagnosis not present

## 2018-01-31 DIAGNOSIS — Z01810 Encounter for preprocedural cardiovascular examination: Secondary | ICD-10-CM | POA: Diagnosis not present

## 2018-03-22 DIAGNOSIS — M25562 Pain in left knee: Secondary | ICD-10-CM | POA: Diagnosis not present

## 2018-03-22 DIAGNOSIS — M1712 Unilateral primary osteoarthritis, left knee: Secondary | ICD-10-CM | POA: Diagnosis not present

## 2018-03-22 DIAGNOSIS — M1711 Unilateral primary osteoarthritis, right knee: Secondary | ICD-10-CM | POA: Diagnosis not present

## 2018-03-30 ENCOUNTER — Other Ambulatory Visit: Payer: Self-pay

## 2018-03-30 DIAGNOSIS — R600 Localized edema: Secondary | ICD-10-CM

## 2018-04-12 DIAGNOSIS — Z1231 Encounter for screening mammogram for malignant neoplasm of breast: Secondary | ICD-10-CM | POA: Diagnosis not present

## 2018-04-13 DIAGNOSIS — I890 Lymphedema, not elsewhere classified: Secondary | ICD-10-CM | POA: Diagnosis not present

## 2018-04-17 DIAGNOSIS — Z6832 Body mass index (BMI) 32.0-32.9, adult: Secondary | ICD-10-CM | POA: Diagnosis not present

## 2018-04-17 DIAGNOSIS — E782 Mixed hyperlipidemia: Secondary | ICD-10-CM | POA: Diagnosis not present

## 2018-04-17 DIAGNOSIS — I10 Essential (primary) hypertension: Secondary | ICD-10-CM | POA: Diagnosis not present

## 2018-04-17 DIAGNOSIS — R609 Edema, unspecified: Secondary | ICD-10-CM | POA: Diagnosis not present

## 2018-04-17 DIAGNOSIS — F322 Major depressive disorder, single episode, severe without psychotic features: Secondary | ICD-10-CM | POA: Diagnosis not present

## 2018-04-17 DIAGNOSIS — E669 Obesity, unspecified: Secondary | ICD-10-CM | POA: Diagnosis not present

## 2018-04-17 DIAGNOSIS — R300 Dysuria: Secondary | ICD-10-CM | POA: Diagnosis not present

## 2018-04-19 DIAGNOSIS — M1712 Unilateral primary osteoarthritis, left knee: Secondary | ICD-10-CM | POA: Diagnosis not present

## 2018-04-19 DIAGNOSIS — M25562 Pain in left knee: Secondary | ICD-10-CM | POA: Diagnosis not present

## 2018-04-19 DIAGNOSIS — M25561 Pain in right knee: Secondary | ICD-10-CM | POA: Diagnosis not present

## 2018-04-20 ENCOUNTER — Other Ambulatory Visit: Payer: Self-pay

## 2018-04-20 ENCOUNTER — Emergency Department
Admission: EM | Admit: 2018-04-20 | Discharge: 2018-04-20 | Disposition: A | Discharge status: Home / Self Care | Payer: Medicare Other | Attending: Emergency Medicine | Admitting: Emergency Medicine

## 2018-04-20 DIAGNOSIS — Z7982 Long term (current) use of aspirin: Secondary | ICD-10-CM | POA: Diagnosis not present

## 2018-04-20 DIAGNOSIS — S81811A Laceration without foreign body, right lower leg, initial encounter: Secondary | ICD-10-CM | POA: Insufficient documentation

## 2018-04-20 DIAGNOSIS — S81851A Open bite, right lower leg, initial encounter: Secondary | ICD-10-CM | POA: Diagnosis not present

## 2018-04-20 DIAGNOSIS — Y92007 Garden or yard of unspecified non-institutional (private) residence as the place of occurrence of the external cause: Secondary | ICD-10-CM | POA: Diagnosis not present

## 2018-04-20 DIAGNOSIS — I10 Essential (primary) hypertension: Secondary | ICD-10-CM | POA: Insufficient documentation

## 2018-04-20 DIAGNOSIS — Y9389 Activity, other specified: Secondary | ICD-10-CM | POA: Insufficient documentation

## 2018-04-20 DIAGNOSIS — Y999 Unspecified external cause status: Secondary | ICD-10-CM | POA: Insufficient documentation

## 2018-04-20 DIAGNOSIS — W5501XA Bitten by cat, initial encounter: Secondary | ICD-10-CM

## 2018-04-20 DIAGNOSIS — Z87891 Personal history of nicotine dependence: Secondary | ICD-10-CM | POA: Insufficient documentation

## 2018-04-20 DIAGNOSIS — W5503XA Scratched by cat, initial encounter: Secondary | ICD-10-CM | POA: Diagnosis not present

## 2018-04-20 MED ORDER — AMOXICILLIN-POT CLAVULANATE 875-125 MG PO TABS: 1.0000 | ORAL_TABLET | Freq: Two times a day (BID) | ORAL | 0 refills | Status: DC

## 2018-04-20 MED ORDER — AMOXICILLIN-POT CLAVULANATE 875-125 MG PO TABS
1.0000 | ORAL_TABLET | Freq: Once | ORAL | Status: AC
  Administered 2018-04-20: 1 via ORAL
  Filled 2018-04-20: qty 1

## 2018-04-20 MED ORDER — BACITRACIN ZINC 500 UNIT/GM EX OINT
TOPICAL_OINTMENT | Freq: Every day | CUTANEOUS | Status: DC
  Administered 2018-04-20: 15:00:00 via TOPICAL

## 2018-04-20 MED ORDER — BACITRACIN ZINC 500 UNIT/GM EX OINT
TOPICAL_OINTMENT | CUTANEOUS | Status: AC
  Filled 2018-04-20: qty 0.9

## 2018-04-20 MED ORDER — LIDOCAINE HCL (PF) 1 % IJ SOLN
INTRAMUSCULAR | Status: AC
  Administered 2018-04-20: 5 mL
  Filled 2018-04-20: qty 5

## 2018-04-20 MED ORDER — LIDOCAINE HCL (PF) 1 % IJ SOLN
5.0000 mL | Freq: Once | INTRAMUSCULAR | Status: AC
  Administered 2018-04-20: 5 mL

## 2018-04-20 NOTE — ED Triage Notes (Signed)
Pt states that she has a ferrel cat (has had rabies shot) - today the cat attacked the pt and she is not sure if it scratched her or bit her on the right lower leg

## 2018-04-20 NOTE — ED Provider Notes (Signed)
[facility] Emergency Department Provider Note  ____________________________________________  Time seen: Approximately 6:02 PM  I have reviewed the triage vital signs and the nursing notes.   HISTORY  Chief Complaint Animal Bite and Laceration   HPI Patricia [name] is a 67 y.o. female who presents to the emergency department for treatment and evaluation of a wound to the right lower extremity caused by a feral cat that she takes care of.  Patient states that it lives in the woods behind her house.  She had gone out to throw away some scraps in the wood line and did not see the cat, but it came in behind her and either scratched or bit her and then ran back in the woods.  Patient states that this cat is a "full blooded Siamese" and has lived behind her house for 3-1/2 years or so and she has caught it and has taken it to "planned pethood" and had it neutered and rabies vaccination given.  She states this is not the first time that the cat has bitten her and her husband.  The first time it happened, she received rabies vaccinations and the wound became badly infected.  Despite the above history, the patient refuses file a report with animal control. she states that she is going to capture the cat and take it to a rescue.  Past Medical History:  Diagnosis Date  . Aneurysm of aorta (HCC)   . Arthritis   . Atrophic vaginitis   . Depression   . Diverticulitis   . Edema of both legs    She takes Lasix a couple of times per week to decrease swelling.    . Fatty liver   . Hyperlipidemia   . Hypertension   . Memory difficulty   . Obesity   . Reflux   . Shingles     Patient Active Problem List   Diagnosis Date Noted  . Essential hypertension, benign 02/26/2013  . Obesity, unspecified 02/26/2013  . Aneurysm of aorta (HCC)   . Depression   . Fatty liver   . Reflux   . Atrophic vaginitis   . DYSPNEA 06/04/2009  . HYPERLIPIDEMIA 06/03/2009  . HYPERTENSION  06/03/2009  . MITRAL VALVE PROLAPSE 06/03/2009  . ESOPHAGEAL REFLUX 06/03/2009    Past Surgical History:  Procedure Laterality Date  . ABDOMINAL HYSTERECTOMY    . AUGMENTATION MAMMAPLASTY    . Basal and Squamous cell cancers excised    . CESAREAN SECTION     X 2  . KNEE SURGERY      Prior to Admission medications   Medication Sig Start Date End Date Taking? Authorizing Provider  ALPRAZolam Duanne Moron) 0.5 MG tablet Take 0.5 mg by mouth as needed for anxiety. For travel 01/16/14   [provider]  amLODipine (NORVASC) 5 MG tablet Take 5 mg by mouth daily.    [provider]  amoxicillin-clavulanate (AUGMENTIN) 875-125 MG tablet Take 1 tablet by mouth 2 (two) times daily. 04/20/18   Taffie Eckmann, Johnette Abraham B, FNP  aspirin EC 81 MG tablet Take 81 mg by mouth daily.    [provider]  BIOTIN PO Take 2 tablets by mouth daily. 3 times daily    [provider]  NONFORMULARY OR COMPOUNDED ITEM Estradiol 0.02% vaginal cream insert twice weekly 03/29/17   Fontaine, Belinda Block, MD  polyethylene glycol (MIRALAX / GLYCOLAX) packet Take 8.4 g by mouth daily.     [provider]  triamterene-hydrochlorothiazide (MAXZIDE-25) 37.5-25 MG per  tablet Take 1 tablet by mouth daily.      [provider]    Allergies Morphine and Tetracyclines & related  Family History  Problem Relation Age of Onset  . Uterine cancer Mother   . Heart disease Mother   . Diabetes Mother   . Hypertension Mother   . Hyperlipidemia Mother   . Alzheimer's disease Father   . Hypertension Father   . Cancer Paternal Grandmother        Gallbladder cancer  . Diverticulitis Paternal Grandmother        gallbladder  . Liver cancer Paternal Grandfather   . Diverticulitis Paternal Grandfather   . Diverticulitis Maternal Grandmother   . Breast cancer Cousin        Paternal 1st cousin Age 19  . Depression Sister   . Bipolar disorder Sister     Social History Social History   Tobacco  Use  . Smoking status: Former Smoker    Packs/day: 1.00    Years: 25.00    Pack years: 25.00  . Smokeless tobacco: Never Used  Substance Use Topics  . Alcohol use: Yes    Alcohol/week: 0.0 oz    Comment: Rare  . Drug use: No    Review of Systems  Constitutional: Negative for fever. Respiratory: Negative for cough or shortness of breath.  Musculoskeletal: Negative for myalgias Skin: Positive for wound to the right lower extremity Neurological: Negative for numbness or paresthesias. ____________________________________________   PHYSICAL EXAM:  VITAL SIGNS: ED Triage Vitals  Enc Vitals Group     BP 04/20/18 1300 (!) 143/88     Pulse Rate 04/20/18 1300 95     Resp 04/20/18 1300 16     Temp 04/20/18 1300 97.7 F (36.5 C)     Temp Source 04/20/18 1300 Oral     SpO2 04/20/18 1300 99 %     Weight 04/20/18 1258 180 lb (81.6 kg)     Height 04/20/18 1258 5' 4"  (1.626 m)     Head Circumference --      Peak Flow --      Pain Score 04/20/18 1257 5     Pain Loc --      Pain Edu? --      Excl. in [place]? --      Constitutional: Well appearing. Eyes: Conjunctivae are clear without discharge or drainage. Nose: No rhinorrhea noted. Mouth/Throat: Airway is patent.  Neck: No stridor. Unrestricted range of motion observed. Cardiovascular: Capillary refill is <3 seconds.  Respiratory: Respirations are even and unlabored.. Musculoskeletal: Unrestricted range of motion observed. Neurologic: Awake, alert, and oriented x 4.  Skin: 4 x 3 x 2 cm laceration to the right lower extremity.  2 puncture wounds a few centimeters away from the laceration.  ____________________________________________   LABS (all labs ordered are listed, but only abnormal results are displayed)  Labs Reviewed - No data to display ____________________________________________  EKG  Not indicated. ____________________________________________  RADIOLOGY  Not  indicated ____________________________________________   PROCEDURES  .Marland KitchenLaceration Repair Date/Time: 04/20/2018 6:10 PM Performed by: Victorino Dike, FNP Authorized by: Victorino Dike, FNP   Consent:    Consent obtained:  Verbal   Consent given by:  Patient   Risks discussed:  Infection, pain, retained foreign body, poor cosmetic result and poor wound healing Anesthesia (see MAR for exact dosages):    Anesthesia method:  Local infiltration   Local anesthetic:  Lidocaine 1% w/o epi Laceration details:    Location:  Leg  Leg location:  R lower leg   Wound length (cm): 4x3x2. Repair type:    Repair type:  Simple Pre-procedure details:    Preparation:  Patient was prepped and draped in usual sterile fashion Exploration:    Hemostasis achieved with:  Direct pressure   Wound exploration: entire depth of wound probed and visualized     Contaminated: no   Treatment:    Area cleansed with:  Saline   Amount of cleaning:  Extensive   Irrigation solution:  Sterile saline   Irrigation method:  Syringe   Visualized foreign bodies/material removed: no   Skin repair:    Repair method:  Sutures   Suture size:  4-0   Suture technique:  Simple interrupted Approximation:    Approximation:  Loose Post-procedure details:    Dressing:  Sterile dressing, antibiotic ointment and non-adherent dressing   Patient tolerance of procedure:  Tolerated well, no immediate complications   ____________________________________________   INITIAL IMPRESSION / ASSESSMENT AND PLAN / ED COURSE  RIDLEY DILEO is a 67 y.o. female who presents to the emergency department for treatment of evaluation after being bitten by a cat.  The wound was irrigated extensively with Betadine and saline.  There was subcutaneous tissue exposure and the wound was fairly large and required loose closure.  3 sutures were inserted just to help reapproximate the skin.  Patient was given strict wound care instructions and  is to see her primary care provider on Monday for a wound check.  She is to return to the emergency department sooner if she begins to have concerns of infection despite being on Augmentin and changing the bandage daily and applying antibiotic ointment.  Medications  amoxicillin-clavulanate (AUGMENTIN) 875-125 MG per tablet 1 tablet (1 tablet Oral Given 04/20/18 1505)  lidocaine (PF) (XYLOCAINE) 1 % injection 5 mL (5 mLs Other Given 04/20/18 1505)    Pertinent labs & imaging results that were available during my care of the patient were reviewed by me and considered in my medical decision making (see chart for details).  ____________________________________________   FINAL CLINICAL IMPRESSION(S) / ED DIAGNOSES  Final diagnoses:  Cat bite, initial encounter    ED Discharge Orders        Ordered    amoxicillin-clavulanate (AUGMENTIN) 875-125 MG tablet  2 times daily     04/20/18 1456       Note:  This document was prepared using Dragon voice recognition software and may include unintentional dictation errors.    Victorino Dike, FNP 04/20/18 1816    Carrie Mew, MD 04/20/18 2041

## 2018-04-20 NOTE — Discharge Instructions (Signed)
Do not get the sutured area wet for 24 hours. After 24 hours, shower/bathe as usual and pat the area dry. Change the bandage 2 times per day and apply antibiotic ointment. Leave open to air when at no risk of getting the area dirty, but cover at night before bed. See your PCP or go to Urgent Care in 10 days for suture removal or sooner for signs or concern of infection.

## 2018-04-20 NOTE — ED Notes (Signed)
Pt would not given information regarding the cat's location or description to notify animal control. Informed NP Cari Beth Triplett of this.

## 2018-04-20 NOTE — ED Triage Notes (Signed)
Says she was either bit or scratched by a known feral cat on back of leg.  Says that cat is up to date on rabies.

## 2018-04-20 NOTE — ED Notes (Signed)
Pt states she was either bit or scratched by a feral cat that she cares for. Pt states the incident happened in [place].  Pt states she has been bitten by the same cat a few years ago before she took it to get rabies vaccine. Pt states she was given rabies vaccine then.  Pt does not want her husband to know that she was bit by the cat.

## 2018-04-24 DIAGNOSIS — R319 Hematuria, unspecified: Secondary | ICD-10-CM | POA: Diagnosis not present

## 2018-04-25 DIAGNOSIS — S81801A Unspecified open wound, right lower leg, initial encounter: Secondary | ICD-10-CM | POA: Diagnosis not present

## 2018-04-25 DIAGNOSIS — W5501XA Bitten by cat, initial encounter: Secondary | ICD-10-CM | POA: Diagnosis not present

## 2018-04-26 DIAGNOSIS — I890 Lymphedema, not elsewhere classified: Secondary | ICD-10-CM | POA: Diagnosis not present

## 2018-04-29 DIAGNOSIS — S81812D Laceration without foreign body, left lower leg, subsequent encounter: Secondary | ICD-10-CM | POA: Diagnosis not present

## 2018-04-29 DIAGNOSIS — W5501XD Bitten by cat, subsequent encounter: Secondary | ICD-10-CM | POA: Diagnosis not present

## 2018-04-29 DIAGNOSIS — L03116 Cellulitis of left lower limb: Secondary | ICD-10-CM | POA: Diagnosis not present

## 2018-05-01 DIAGNOSIS — T148XXA Other injury of unspecified body region, initial encounter: Secondary | ICD-10-CM | POA: Diagnosis not present

## 2018-05-04 DIAGNOSIS — W540XXS Bitten by dog, sequela: Secondary | ICD-10-CM | POA: Diagnosis not present

## 2018-05-04 DIAGNOSIS — L0390 Cellulitis, unspecified: Secondary | ICD-10-CM | POA: Diagnosis not present

## 2018-05-04 DIAGNOSIS — R197 Diarrhea, unspecified: Secondary | ICD-10-CM | POA: Diagnosis not present

## 2018-05-06 ENCOUNTER — Inpatient Hospital Stay (HOSPITAL_COMMUNITY)
Admission: EM | Admit: 2018-05-06 | Discharge: 2018-05-07 | DRG: 603 | Disposition: A | Discharge status: Home / Self Care | Payer: Medicare Other | Attending: Internal Medicine | Admitting: Internal Medicine

## 2018-05-06 ENCOUNTER — Other Ambulatory Visit: Payer: Self-pay

## 2018-05-06 ENCOUNTER — Encounter (HOSPITAL_COMMUNITY): Payer: Self-pay | Admitting: Emergency Medicine

## 2018-05-06 DIAGNOSIS — I10 Essential (primary) hypertension: Secondary | ICD-10-CM | POA: Diagnosis present

## 2018-05-06 DIAGNOSIS — E785 Hyperlipidemia, unspecified: Secondary | ICD-10-CM | POA: Diagnosis present

## 2018-05-06 DIAGNOSIS — L02415 Cutaneous abscess of right lower limb: Secondary | ICD-10-CM | POA: Diagnosis present

## 2018-05-06 DIAGNOSIS — Y92821 Forest as the place of occurrence of the external cause: Secondary | ICD-10-CM | POA: Diagnosis not present

## 2018-05-06 DIAGNOSIS — L03115 Cellulitis of right lower limb: Principal | ICD-10-CM | POA: Diagnosis present

## 2018-05-06 DIAGNOSIS — Z888 Allergy status to other drugs, medicaments and biological substances status: Secondary | ICD-10-CM

## 2018-05-06 DIAGNOSIS — Z79899 Other long term (current) drug therapy: Secondary | ICD-10-CM

## 2018-05-06 DIAGNOSIS — L089 Local infection of the skin and subcutaneous tissue, unspecified: Secondary | ICD-10-CM | POA: Diagnosis not present

## 2018-05-06 DIAGNOSIS — M1990 Unspecified osteoarthritis, unspecified site: Secondary | ICD-10-CM | POA: Diagnosis present

## 2018-05-06 DIAGNOSIS — R599 Enlarged lymph nodes, unspecified: Secondary | ICD-10-CM | POA: Diagnosis not present

## 2018-05-06 DIAGNOSIS — Z87891 Personal history of nicotine dependence: Secondary | ICD-10-CM | POA: Diagnosis not present

## 2018-05-06 DIAGNOSIS — Z6831 Body mass index (BMI) 31.0-31.9, adult: Secondary | ICD-10-CM

## 2018-05-06 DIAGNOSIS — L0390 Cellulitis, unspecified: Secondary | ICD-10-CM | POA: Diagnosis present

## 2018-05-06 DIAGNOSIS — Z23 Encounter for immunization: Secondary | ICD-10-CM | POA: Diagnosis not present

## 2018-05-06 DIAGNOSIS — Z885 Allergy status to narcotic agent status: Secondary | ICD-10-CM

## 2018-05-06 DIAGNOSIS — K219 Gastro-esophageal reflux disease without esophagitis: Secondary | ICD-10-CM | POA: Diagnosis not present

## 2018-05-06 DIAGNOSIS — S81851A Open bite, right lower leg, initial encounter: Secondary | ICD-10-CM | POA: Diagnosis present

## 2018-05-06 DIAGNOSIS — E669 Obesity, unspecified: Secondary | ICD-10-CM | POA: Diagnosis present

## 2018-05-06 DIAGNOSIS — T148XXA Other injury of unspecified body region, initial encounter: Secondary | ICD-10-CM | POA: Diagnosis not present

## 2018-05-06 DIAGNOSIS — W5501XA Bitten by cat, initial encounter: Secondary | ICD-10-CM

## 2018-05-06 LAB — COMPREHENSIVE METABOLIC PANEL
ALK PHOS: 84 U/L (ref 38–126)
ALT: 24 U/L (ref 0–44)
AST: 23 U/L (ref 15–41)
Albumin: 3.5 g/dL (ref 3.5–5.0)
Anion gap: 11 (ref 5–15)
BILIRUBIN TOTAL: 1.1 mg/dL (ref 0.3–1.2)
BUN: 15 mg/dL (ref 8–23)
CALCIUM: 8.9 mg/dL (ref 8.9–10.3)
CO2: 26 mmol/L (ref 22–32)
CREATININE: 0.72 mg/dL (ref 0.44–1.00)
Chloride: 105 mmol/L (ref 98–111)
GFR calc Af Amer: >60 mL/min (ref >60)
Glucose, Bld: 134 mg/dL — ABNORMAL HIGH (ref 70–99)
Potassium: 3.6 mmol/L (ref 3.5–5.1)
Sodium: 142 mmol/L (ref 135–145)
TOTAL PROTEIN: 5.9 g/dL — AB (ref 6.5–8.1)

## 2018-05-06 LAB — URINALYSIS, ROUTINE W REFLEX MICROSCOPIC
BILIRUBIN URINE: NEGATIVE
Bacteria, UA: NONE SEEN
Glucose, UA: NEGATIVE mg/dL
Ketones, ur: NEGATIVE mg/dL
Nitrite: NEGATIVE
PROTEIN: NEGATIVE mg/dL
SPECIFIC GRAVITY, URINE: 1.024 (ref 1.005–1.030)
pH: 5 (ref 5.0–8.0)

## 2018-05-06 LAB — I-STAT CG4 LACTIC ACID, ED
LACTIC ACID, VENOUS: 2.08 mmol/L — AB (ref 0.5–1.9)
Lactic Acid, Venous: 2.04 mmol/L (ref 0.5–1.9)
Lactic Acid, Venous: 1.57 mmol/L (ref 0.5–1.9)

## 2018-05-06 LAB — CBC WITH DIFFERENTIAL/PLATELET
Abs Immature Granulocytes: 0 10*3/uL (ref 0.0–0.1)
BASOS ABS: 0.1 10*3/uL (ref 0.0–0.1)
Basophils Relative: 1 %
EOS PCT: 2 %
Eosinophils Absolute: 0.1 10*3/uL (ref 0.0–0.7)
HCT: 43.2 % (ref 36.0–46.0)
HEMOGLOBIN: 13.5 g/dL (ref 12.0–15.0)
Immature Granulocytes: 0 %
LYMPHS PCT: 24 %
Lymphs Abs: 1.6 10*3/uL (ref 0.7–4.0)
MCH: 29.9 pg (ref 26.0–34.0)
MCHC: 31.3 g/dL (ref 30.0–36.0)
MCV: 95.6 fL (ref 78.0–100.0)
Monocytes Absolute: 0.4 10*3/uL (ref 0.1–1.0)
Monocytes Relative: 6 %
NEUTROS PCT: 67 %
Neutro Abs: 4.5 10*3/uL (ref 1.7–7.7)
Platelets: 245 10*3/uL (ref 150–400)
RBC: 4.52 MIL/uL (ref 3.87–5.11)
RDW: 12.8 % (ref 11.5–15.5)
WBC: 6.7 10*3/uL (ref 4.0–10.5)

## 2018-05-06 LAB — PROTIME-INR
INR: 0.95
PROTHROMBIN TIME: 12.6 s (ref 11.4–15.2)

## 2018-05-06 MED ORDER — SODIUM CHLORIDE 0.9 % IV SOLN
3.0000 g | Freq: Four times a day (QID) | INTRAVENOUS | Status: DC
  Administered 2018-05-06 – 2018-05-07 (×3): 3 g via INTRAVENOUS
  Filled 2018-05-06 (×4): qty 3
  Filled 2018-05-06: qty 0
  Filled 2018-05-06: qty 3

## 2018-05-06 MED ORDER — RABIES VACCINE, PCEC IM SUSR
1.0000 mL | Freq: Once | INTRAMUSCULAR | Status: AC
  Administered 2018-05-06: 1 mL via INTRAMUSCULAR
  Filled 2018-05-06: qty 1

## 2018-05-06 MED ORDER — SODIUM CHLORIDE 0.9 % IV BOLUS
1000.0000 mL | Freq: Once | INTRAVENOUS | Status: AC
Start: 2018-05-06 — End: 2018-05-06
  Administered 2018-05-06: 1000 mL via INTRAVENOUS

## 2018-05-06 NOTE — ED Notes (Signed)
Pt ambulatory to bathroom

## 2018-05-06 NOTE — ED Triage Notes (Signed)
Pt presents with continued concerns with RLE wound by animal bite (cat?) where she was seen and treated with antibiotics, rabies vaccine; due for next dose of rabies vaccine/ig; pt wound noted to have purulent drainage and patient now reporting pain progressing upward to joints and lympnodes

## 2018-05-06 NOTE — ED Notes (Signed)
I-Stat Lac Acid Result of 2.04. Reported to Dr. Alvino Chapel.

## 2018-05-06 NOTE — ED Provider Notes (Signed)
[facility] EMERGENCY DEPARTMENT Provider Note   CSN: [id number] Arrival date & time: 05/06/18  1747     History   Chief Complaint Chief Complaint  Patient presents with  . Leg Injury  . Wound Infection    HPI Patricia [name] is a 67 y.o. female with a past medical history of hypertension, hyperlipidemia, who presents to ED for evaluation of wound to right shin that occurred 7/4.  States that she was bit by an unknown animal from the woods, possibly a cat.  She was seen at an outside ED after the injury occurred.  Loose sutures were placed after extensive irrigation.  She was put on Augmentin which she completed an entire course of.  She followed up with her PCP approximately 5 days ago and was started on doxycycline for a possible MRSA infection.  She is compliant with this.  She is concerned because she now feels that she has enlarged lymph nodes in her bilateral legs.  States that the wound itself looks like it is improving but the surrounding redness is slowly worsening.  She denies any fevers.  Denies any additional injury or trauma to the area. Patient is also here to receive her rabies vaccine.  She completed entire course of the rabies series several years ago for another bite.  She has not yet received any vaccines for this wound.  HPI  Past Medical History:  Diagnosis Date  . Aneurysm of aorta (HCC)   . Arthritis   . Atrophic vaginitis   . Depression   . Diverticulitis   . Edema of both legs    She takes Lasix a couple of times per week to decrease swelling.    . Fatty liver   . Hyperlipidemia   . Hypertension   . Memory difficulty   . Obesity   . Reflux   . Shingles     Patient Active Problem List   Diagnosis Date Noted  . Cellulitis 05/07/2018  . Cellulitis and abscess of right leg 05/07/2018  . Essential hypertension, benign 02/26/2013  . Obesity, unspecified 02/26/2013  . Aneurysm of aorta (HCC)   . Depression   . Fatty liver   .  Reflux   . Atrophic vaginitis   . DYSPNEA 06/04/2009  . HYPERLIPIDEMIA 06/03/2009  . HYPERTENSION 06/03/2009  . MITRAL VALVE PROLAPSE 06/03/2009  . Esophageal reflux 06/03/2009    Past Surgical History:  Procedure Laterality Date  . ABDOMINAL HYSTERECTOMY    . AUGMENTATION MAMMAPLASTY    . Basal and Squamous cell cancers excised    . CESAREAN SECTION     X 2  . KNEE SURGERY       OB History    Gravida  2   Para  2   Term  2   Preterm      AB      Living  2     SAB      TAB      Ectopic      Multiple      Live Births               Home Medications    Prior to Admission medications   Medication Sig Start Date End Date Taking? Authorizing Provider  ALPRAZolam Duanne Moron) 0.5 MG tablet Take 0.5 mg by mouth as needed for anxiety. For travel 01/16/14  Yes [provider]  amLODipine (NORVASC) 5 MG tablet Take 5 mg by mouth daily.   Yes [provider]  buPROPion (WELLBUTRIN XL) 150 MG 24 hr tablet Take 150 mg by mouth daily.   Yes [provider]  desvenlafaxine (PRISTIQ) 50 MG 24 hr tablet Take 50 mg by mouth daily.   Yes [provider]  esomeprazole (NEXIUM) 20 MG capsule Take 20 mg by mouth daily at 12 noon.   Yes [provider]  triamterene-hydrochlorothiazide (MAXZIDE-25) 37.5-25 MG per tablet Take 1 tablet by mouth daily.     Yes [provider]    Family History Family History  Problem Relation Age of Onset  . Uterine cancer Mother   . Heart disease Mother   . Diabetes Mother   . Hypertension Mother   . Hyperlipidemia Mother   . Alzheimer's disease Father   . Hypertension Father   . Cancer Paternal Grandmother        Gallbladder cancer  . Diverticulitis Paternal Grandmother        gallbladder  . Liver cancer Paternal Grandfather   . Diverticulitis Paternal Grandfather   . Diverticulitis Maternal Grandmother   . Breast cancer Cousin        Paternal 1st cousin Age 82  . Depression Sister     . Bipolar disorder Sister     Social History Social History   Tobacco Use  . Smoking status: Former Smoker    Packs/day: 1.00    Years: 25.00    Pack years: 25.00  . Smokeless tobacco: Never Used  Substance Use Topics  . Alcohol use: Yes    Alcohol/week: 0.0 oz    Comment: Rare  . Drug use: No     Allergies   Clindamycin/lincomycin; Morphine; and Tetracyclines & related   Review of Systems Review of Systems  Constitutional: Negative for appetite change, chills and fever.  HENT: Negative for ear pain, rhinorrhea, sneezing and sore throat.   Eyes: Negative for photophobia and visual disturbance.  Respiratory: Negative for cough, chest tightness, shortness of breath and wheezing.   Cardiovascular: Negative for chest pain and palpitations.  Gastrointestinal: Negative for abdominal pain, blood in stool, constipation, diarrhea, nausea and vomiting.  Genitourinary: Negative for dysuria, hematuria and urgency.  Musculoskeletal: Negative for myalgias.  Skin: Positive for color change and wound. Negative for rash.  Neurological: Negative for dizziness, weakness and light-headedness.     Physical Exam Updated Vital Signs BP 127/84 (BP Location: Right Arm)   Pulse 85   Temp 98.7 F (37.1 C) (Oral)   Resp 16   Ht 5' 4"  (1.626 m)   Wt 84.4 kg (186 lb)   SpO2 98%   BMI 31.93 kg/m   Physical Exam  Constitutional: She appears well-developed and well-nourished. No distress.  HENT:  Head: Normocephalic and atraumatic.  Nose: Nose normal.  Eyes: Conjunctivae and EOM are normal. Left eye exhibits no discharge. No scleral icterus.  Neck: Normal range of motion. Neck supple.  Cardiovascular: Normal rate, regular rhythm, normal heart sounds and intact distal pulses. Exam reveals no gallop and no friction rub.  No murmur heard. Pulmonary/Chest: Effort normal and breath sounds normal. No respiratory distress.  Abdominal: Soft. Bowel sounds are normal. She exhibits no distension.  There is no tenderness. There is no guarding.  Musculoskeletal: Normal range of motion. She exhibits no edema.  Neurological: She is alert. She exhibits normal muscle tone. Coordination normal.  Skin: Skin is warm and dry. No rash noted. There is erythema.  Wound noted to right shin. Surrounding erythema extending/streaking slightly down towards the foot.  No warmth  or tenderness to palpation of the area.  Psychiatric: She has a normal mood and affect.  Nursing note and vitals reviewed.        ED Treatments / Results  Labs (all labs ordered are listed, but only abnormal results are displayed) Labs Reviewed  COMPREHENSIVE METABOLIC PANEL - Abnormal; Notable for the following components:      Result Value   Glucose, Bld 134 (*)    Total Protein 5.9 (*)    All other components within normal limits  URINALYSIS, ROUTINE W REFLEX MICROSCOPIC - Abnormal; Notable for the following components:   APPearance HAZY (*)    Hgb urine dipstick MODERATE (*)    Leukocytes, UA TRACE (*)    All other components within normal limits  I-STAT CG4 LACTIC ACID, ED - Abnormal; Notable for the following components:   Lactic Acid, Venous 2.08 (*)    All other components within normal limits  I-STAT CG4 LACTIC ACID, ED - Abnormal; Notable for the following components:   Lactic Acid, Venous 2.04 (*)    All other components within normal limits  CULTURE, BLOOD (ROUTINE X 2)  CULTURE, BLOOD (ROUTINE X 2)  AEROBIC CULTURE (SUPERFICIAL SPECIMEN)  CBC WITH DIFFERENTIAL/PLATELET  PROTIME-INR  I-STAT CG4 LACTIC ACID, ED  I-STAT CG4 LACTIC ACID, ED  I-STAT CG4 LACTIC ACID, ED    EKG None  Radiology No results found.  Procedures Procedures (including critical care time)  Medications Ordered in ED Medications  Ampicillin-Sulbactam (UNASYN) 3 g in sodium chloride 0.9 % 100 mL IVPB (0 g Intravenous Stopped 05/07/18 0009)  vancomycin (VANCOCIN) IVPB 1000 mg/200 mL premix (has no administration in time  range)  vancomycin (VANCOCIN) IVPB 1000 mg/200 mL premix (has no administration in time range)  sodium chloride 0.9 % bolus 1,000 mL (0 mLs Intravenous Stopped 05/06/18 2121)  rabies vaccine (RABAVERT) injection 1 mL (1 mL Intramuscular Given 05/06/18 2059)     Initial Impression / Assessment and Plan / ED Course  I have reviewed the triage vital signs and the nursing notes.  Pertinent labs & imaging results that were available during my care of the patient were reviewed by me and considered in my medical decision making (see chart for details).     67 year old female with a past medical history of hypertension, hyperlipidemia presents to ED for evaluation of right shin wound that occurred on 7/4.  She was bit by an unknown animal, possibly a cat from the woods.  She was seen and evaluated at [facility] ED and loose sutures were placed.  She was given Augmentin which she completed the course for.  She saw her PCP 5 days ago and they were concerned about a MRSA infection.  They put her on doxycycline which she has been taking as prescribed.  She is concerned because she feels that she is got enlarged lymph nodes in both of her legs and that the redness and drainage is getting worse.  Physical exam findings consistent with image above.  There is some streaking noted towards the foot.  She is afebrile.  WBC count is normal.  Initial lactic elevated to 2.08, 2.04.  Patient was given Unasyn.  She will need to be admitted for IV antibiotics in the setting of failed outpatient treatment for animal bite.  Specialist to admit.  Portions of this note were generated with Lobbyist. Dictation errors may occur despite best attempts at proofreading.   Final Clinical Impressions(s) / ED Diagnoses   Final diagnoses:  Wound infection    ED Discharge Orders    None       Delia Heady, PA-C 05/07/18 0042    Davonna Belling, MD 05/07/18 [phone]

## 2018-05-07 ENCOUNTER — Other Ambulatory Visit: Payer: Self-pay

## 2018-05-07 ENCOUNTER — Encounter (HOSPITAL_COMMUNITY): Payer: Self-pay | Admitting: *Deleted

## 2018-05-07 DIAGNOSIS — S81851A Open bite, right lower leg, initial encounter: Secondary | ICD-10-CM | POA: Diagnosis present

## 2018-05-07 DIAGNOSIS — Z79899 Other long term (current) drug therapy: Secondary | ICD-10-CM | POA: Diagnosis not present

## 2018-05-07 DIAGNOSIS — K219 Gastro-esophageal reflux disease without esophagitis: Secondary | ICD-10-CM | POA: Diagnosis not present

## 2018-05-07 DIAGNOSIS — L02415 Cutaneous abscess of right lower limb: Secondary | ICD-10-CM | POA: Diagnosis not present

## 2018-05-07 DIAGNOSIS — I10 Essential (primary) hypertension: Secondary | ICD-10-CM

## 2018-05-07 DIAGNOSIS — Z23 Encounter for immunization: Secondary | ICD-10-CM | POA: Diagnosis not present

## 2018-05-07 DIAGNOSIS — Z888 Allergy status to other drugs, medicaments and biological substances status: Secondary | ICD-10-CM | POA: Diagnosis not present

## 2018-05-07 DIAGNOSIS — L03115 Cellulitis of right lower limb: Secondary | ICD-10-CM | POA: Diagnosis not present

## 2018-05-07 DIAGNOSIS — L0390 Cellulitis, unspecified: Secondary | ICD-10-CM | POA: Diagnosis present

## 2018-05-07 DIAGNOSIS — Z885 Allergy status to narcotic agent status: Secondary | ICD-10-CM | POA: Diagnosis not present

## 2018-05-07 DIAGNOSIS — W5501XA Bitten by cat, initial encounter: Secondary | ICD-10-CM | POA: Diagnosis not present

## 2018-05-07 DIAGNOSIS — Y92821 Forest as the place of occurrence of the external cause: Secondary | ICD-10-CM | POA: Diagnosis not present

## 2018-05-07 DIAGNOSIS — E785 Hyperlipidemia, unspecified: Secondary | ICD-10-CM | POA: Diagnosis present

## 2018-05-07 DIAGNOSIS — T148XXA Other injury of unspecified body region, initial encounter: Secondary | ICD-10-CM | POA: Diagnosis not present

## 2018-05-07 DIAGNOSIS — M1990 Unspecified osteoarthritis, unspecified site: Secondary | ICD-10-CM | POA: Diagnosis present

## 2018-05-07 DIAGNOSIS — Z87891 Personal history of nicotine dependence: Secondary | ICD-10-CM | POA: Diagnosis not present

## 2018-05-07 DIAGNOSIS — Z6831 Body mass index (BMI) 31.0-31.9, adult: Secondary | ICD-10-CM | POA: Diagnosis not present

## 2018-05-07 DIAGNOSIS — R599 Enlarged lymph nodes, unspecified: Secondary | ICD-10-CM | POA: Diagnosis present

## 2018-05-07 HISTORY — DX: Cutaneous abscess of right lower limb: L02.415

## 2018-05-07 LAB — COMPREHENSIVE METABOLIC PANEL
ALK PHOS: 72 U/L (ref 38–126)
ALT: 20 U/L (ref 0–44)
ANION GAP: 7 (ref 5–15)
AST: 20 U/L (ref 15–41)
Albumin: 3 g/dL — ABNORMAL LOW (ref 3.5–5.0)
BILIRUBIN TOTAL: 0.9 mg/dL (ref 0.3–1.2)
BUN: 10 mg/dL (ref 8–23)
CALCIUM: 8.2 mg/dL — AB (ref 8.9–10.3)
CO2: 28 mmol/L (ref 22–32)
Chloride: 108 mmol/L (ref 98–111)
Creatinine, Ser: 0.61 mg/dL (ref 0.44–1.00)
Glucose, Bld: 115 mg/dL — ABNORMAL HIGH (ref 70–99)
Potassium: 3.2 mmol/L — ABNORMAL LOW (ref 3.5–5.1)
SODIUM: 143 mmol/L (ref 135–145)
TOTAL PROTEIN: 5.6 g/dL — AB (ref 6.5–8.1)

## 2018-05-07 LAB — CREATININE, SERUM
CREATININE: 0.59 mg/dL (ref 0.44–1.00)
GFR calc Af Amer: >60 mL/min (ref >60)
GFR calc non Af Amer: >60 mL/min (ref >60)

## 2018-05-07 LAB — CBC
HCT: 38.7 % (ref 36.0–46.0)
Hemoglobin: 12.2 g/dL (ref 12.0–15.0)
MCH: 30 pg (ref 26.0–34.0)
MCHC: 31.5 g/dL (ref 30.0–36.0)
MCV: 95.1 fL (ref 78.0–100.0)
PLATELETS: 207 10*3/uL (ref 150–400)
RBC: 4.07 MIL/uL (ref 3.87–5.11)
RDW: 12.8 % (ref 11.5–15.5)
WBC: 6.6 10*3/uL (ref 4.0–10.5)

## 2018-05-07 LAB — HIV ANTIBODY (ROUTINE TESTING W REFLEX): HIV SCREEN 4TH GENERATION: NONREACTIVE

## 2018-05-07 MED ORDER — TRIAMTERENE-HCTZ 37.5-25 MG PO TABS
1.0000 | ORAL_TABLET | Freq: Every day | ORAL | Status: DC
  Administered 2018-05-07: 1 via ORAL
  Filled 2018-05-07: qty 1

## 2018-05-07 MED ORDER — ALPRAZOLAM 0.5 MG PO TABS: 0.5000 mg | ORAL_TABLET | Freq: Every day | ORAL | Status: DC | PRN

## 2018-05-07 MED ORDER — BIOTIN 2.5 MG PO TABS: ORAL_TABLET | Freq: Every day | ORAL | Status: DC

## 2018-05-07 MED ORDER — SULFAMETHOXAZOLE-TRIMETHOPRIM 800-160 MG PO TABS: 1.0000 | ORAL_TABLET | Freq: Two times a day (BID) | ORAL | 0 refills | Status: AC

## 2018-05-07 MED ORDER — POTASSIUM CHLORIDE CRYS ER 20 MEQ PO TBCR
40.0000 meq | EXTENDED_RELEASE_TABLET | Freq: Once | ORAL | Status: AC
  Administered 2018-05-07: 40 meq via ORAL
  Filled 2018-05-07: qty 2

## 2018-05-07 MED ORDER — SODIUM CHLORIDE 0.9 % IV SOLN
INTRAVENOUS | Status: DC
  Administered 2018-05-07: 01:00:00 via INTRAVENOUS

## 2018-05-07 MED ORDER — ACETAMINOPHEN 650 MG RE SUPP: 650.0000 mg | Freq: Four times a day (QID) | RECTAL | Status: DC | PRN

## 2018-05-07 MED ORDER — METRONIDAZOLE 500 MG PO TABS: 500.0000 mg | ORAL_TABLET | Freq: Three times a day (TID) | ORAL | 0 refills | Status: AC

## 2018-05-07 MED ORDER — VANCOMYCIN HCL IN DEXTROSE 1-5 GM/200ML-% IV SOLN
1000.0000 mg | Freq: Two times a day (BID) | INTRAVENOUS | Status: DC
  Filled 2018-05-07 (×2): qty 200

## 2018-05-07 MED ORDER — AMLODIPINE BESYLATE 5 MG PO TABS
5.0000 mg | ORAL_TABLET | Freq: Every day | ORAL | Status: DC
  Administered 2018-05-07: 5 mg via ORAL
  Filled 2018-05-07: qty 1

## 2018-05-07 MED ORDER — DOCUSATE SODIUM 100 MG PO CAPS
100.0000 mg | ORAL_CAPSULE | Freq: Two times a day (BID) | ORAL | Status: DC
  Filled 2018-05-07: qty 1

## 2018-05-07 MED ORDER — ENOXAPARIN SODIUM 40 MG/0.4ML ~~LOC~~ SOLN
40.0000 mg | SUBCUTANEOUS | Status: DC
  Administered 2018-05-07: 40 mg via SUBCUTANEOUS
  Filled 2018-05-07: qty 0.4

## 2018-05-07 MED ORDER — FLUCONAZOLE 150 MG PO TABS
150.0000 mg | ORAL_TABLET | Freq: Once | ORAL | Status: AC
  Administered 2018-05-07: 150 mg via ORAL
  Filled 2018-05-07: qty 1

## 2018-05-07 MED ORDER — VANCOMYCIN HCL IN DEXTROSE 1-5 GM/200ML-% IV SOLN
1000.0000 mg | Freq: Once | INTRAVENOUS | Status: AC
  Administered 2018-05-07: 1000 mg via INTRAVENOUS

## 2018-05-07 MED ORDER — ONDANSETRON HCL 4 MG PO TABS: 4.0000 mg | ORAL_TABLET | Freq: Four times a day (QID) | ORAL | Status: DC | PRN

## 2018-05-07 MED ORDER — POLYETHYLENE GLYCOL 3350 17 G PO PACK
0.5000 | PACK | Freq: Every day | ORAL | Status: DC
  Administered 2018-05-07: 8.5 g via ORAL
  Filled 2018-05-07: qty 1

## 2018-05-07 MED ORDER — ONDANSETRON HCL 4 MG/2ML IJ SOLN: 4.0000 mg | Freq: Four times a day (QID) | INTRAMUSCULAR | Status: DC | PRN

## 2018-05-07 MED ORDER — ACETAMINOPHEN 325 MG PO TABS: 650.0000 mg | ORAL_TABLET | Freq: Four times a day (QID) | ORAL | Status: DC | PRN

## 2018-05-07 MED ORDER — KETOROLAC TROMETHAMINE 30 MG/ML IJ SOLN: 30.0000 mg | Freq: Four times a day (QID) | INTRAMUSCULAR | Status: DC | PRN

## 2018-05-07 MED ORDER — ASPIRIN EC 81 MG PO TBEC
81.0000 mg | DELAYED_RELEASE_TABLET | Freq: Every day | ORAL | Status: DC
  Administered 2018-05-07: 81 mg via ORAL
  Filled 2018-05-07: qty 1

## 2018-05-07 NOTE — Progress Notes (Addendum)
Pharmacy Antibiotic Note  Patricia [name] is a 67 y.o. female admitted on 05/06/2018 with Right leg wound.  Pharmacy has been consulted for Vancomycin dosing.  Plan: Vancomycin 1000 mg IV now, then Vancomycin 1 g IV q12h starting at 0600  Height: 5' 4"  (162.6 cm) Weight: 186 lb (84.4 kg) IBW/kg (Calculated) : 54.7  Temp (24hrs), Avg:98.1 F (36.7 C), Min:97.5 F (36.4 C), Max:98.7 F (37.1 C)  Recent Labs  Lab 05/06/18 1830 05/06/18 1839 05/06/18 2107 05/06/18 2248  WBC 6.7  --   --   --   CREATININE 0.72  --   --   --   LATICACIDVEN  --  2.08* 2.04* 1.57    Estimated Creatinine Clearance: 70.8 mL/min (by C-G formula based on SCr of 0.72 mg/dL).    Allergies  Allergen Reactions  . Clindamycin/Lincomycin Other (See Comments)    Stomach pain  . Morphine     REACTION: itch  . Tetracyclines & Related Other (See Comments)    Stomach pain     Caryl Pina 05/07/2018 12:22 AM

## 2018-05-07 NOTE — Progress Notes (Signed)
Patricia [name] to be D/C'd Home per MD order.  Discussed with the patient and all questions fully answered.  VSS, Skin clean, dry and intact without evidence of skin break down, no evidence of skin tears noted. IV catheter discontinued intact. Site without signs and symptoms of complications. Dressing and pressure applied.  An After Visit Summary was printed and given to the patient. Patient received prescription.  D/c education completed with patient/family including follow up instructions, medication list, d/c activities limitations if indicated, with other d/c instructions as indicated by MD - patient able to verbalize understanding, all questions fully answered.   Patient instructed to return to ED, call 911, or call MD for any changes in condition.   Patient escorted via [place], and D/C home via private auto. Education for dressing changes done.   [name] 05/07/2018 5:21 PM

## 2018-05-07 NOTE — H&P (Addendum)
History and Physical    Patricia [name] [id number]:[id number] DOB: 03/28/1951 DOA: 05/06/2018  Referring MD/NP/PA: Delia Heady, PA  PCP: [name], MD   Patient coming from: Home  Chief Complaint: Right leg wound  HPI: Patricia [name] is a 67 y.o. female with medical history significant of hypertension, hyperlipidemia and aortic aneurysm, GERD, depression and morbid obesity who sustained cat bite on the right shin on July 4.  She was in the woods when it happened.  Patient suspected it to be the cath.  She was seen in the ED where the wound was irrigated treated including putting sutures in there.  Since then she has been on multiple oral antibiotics including Augmentin and recently doxycycline in the last 5 days.  The wound site has continued to get worse.  She noticed that it was red more than previously.  No fever or chills no nausea vomiting or diarrhea.  Pain is also at 5 out of 10.  Patient is worried and came to the ER today.  She had no cultures obtained previously.  Since patient has failed outpatient treatment she is being admitted for inpatient care.  ED Course: Temperature is 98.7 with blood pressure 142/92 pulse 88 and sats 98% room air.  White count is 6.7 hemoglobin 13.5 and platelet 245.  Electrolytes are normal glucose 134 and lactic acid 1.57. Patient given rabies immunization booster and wound care obtained in ER.  Review of Systems: As per HPI otherwise 10 point review of systems negative.    Past Medical History:  Diagnosis Date  . Aneurysm of aorta (HCC)   . Arthritis   . Atrophic vaginitis   . Depression   . Diverticulitis   . Edema of both legs    She takes Lasix a couple of times per week to decrease swelling.    . Fatty liver   . Hyperlipidemia   . Hypertension   . Memory difficulty   . Obesity   . Reflux   . Shingles     Past Surgical History:  Procedure Laterality Date  . ABDOMINAL HYSTERECTOMY    . AUGMENTATION MAMMAPLASTY    . Basal and  Squamous cell cancers excised    . CESAREAN SECTION     X 2  . KNEE SURGERY       reports that she has quit smoking. She has a 25.00 pack-year smoking history. She has never used smokeless tobacco. She reports that she drinks alcohol. She reports that she does not use drugs.  Allergies  Allergen Reactions  . Clindamycin/Lincomycin Other (See Comments)    Stomach pain  . Morphine     REACTION: itch  . Tetracyclines & Related Other (See Comments)    Stomach pain    Family History  Problem Relation Age of Onset  . Uterine cancer Mother   . Heart disease Mother   . Diabetes Mother   . Hypertension Mother   . Hyperlipidemia Mother   . Alzheimer's disease Father   . Hypertension Father   . Cancer Paternal Grandmother        Gallbladder cancer  . Diverticulitis Paternal Grandmother        gallbladder  . Liver cancer Paternal Grandfather   . Diverticulitis Paternal Grandfather   . Diverticulitis Maternal Grandmother   . Breast cancer Cousin        Paternal 1st cousin Age 35  . Depression Sister   . Bipolar disorder Sister      Prior to Admission  medications   Medication Sig Start Date End Date Taking? Authorizing Provider  ALPRAZolam Duanne Moron) 0.5 MG tablet Take 0.5 mg by mouth as needed for anxiety. For travel 01/16/14   [provider]  amLODipine (NORVASC) 5 MG tablet Take 5 mg by mouth daily.    [provider]  amoxicillin-clavulanate (AUGMENTIN) 875-125 MG tablet Take 1 tablet by mouth 2 (two) times daily. 04/20/18   Triplett, Johnette Abraham B, FNP  aspirin EC 81 MG tablet Take 81 mg by mouth daily.    [provider]  BIOTIN PO Take 2 tablets by mouth daily. 3 times daily    [provider]  NONFORMULARY OR COMPOUNDED ITEM Estradiol 0.02% vaginal cream insert twice weekly 03/29/17   Fontaine, Belinda Block, MD  polyethylene glycol (MIRALAX / GLYCOLAX) packet Take 8.4 g by mouth daily.     [provider]  triamterene-hydrochlorothiazide  (MAXZIDE-25) 37.5-25 MG per tablet Take 1 tablet by mouth daily.      [provider]    Physical Exam: Vitals:   05/06/18 1757 05/06/18 2216  BP: (!) 142/92 127/84  Pulse: 88 85  Resp: 18 16  Temp: (!) 97.5 F (36.4 C) 98.7 F (37.1 C)  TempSrc: Oral Oral  SpO2: 98%   Weight: 84.4 kg (186 lb)   Height: 5' 4"  (1.626 m)       Constitutional: NAD, calm, comfortable Vitals:   05/06/18 1757 05/06/18 2216  BP: (!) 142/92 127/84  Pulse: 88 85  Resp: 18 16  Temp: (!) 97.5 F (36.4 C) 98.7 F (37.1 C)  TempSrc: Oral Oral  SpO2: 98%   Weight: 84.4 kg (186 lb)   Height: 5' 4"  (1.626 m)    Morbidly obese, stable with no acute distress Eyes: PERRL, lids and conjunctivae normal ENMT: Mucous membranes are moist. Posterior pharynx clear of any exudate or lesions.Normal dentition.  Neck: normal, supple, no masses, no thyromegaly Respiratory: clear to auscultation bilaterally, no wheezing, no crackles. Normal respiratory effort. No accessory muscle use.  Cardiovascular: Regular rate and rhythm, no murmurs / rubs / gallops. No extremity edema. 2+ pedal pulses. No carotid bruits.  Abdomen: no tenderness, no masses palpated. No hepatosplenomegaly. Bowel sounds positive.  Musculoskeletal: no clubbing / cyanosis. No joint deformity upper and lower extremities. Good ROM, no contractures. Normal muscle tone.  Skin: Large ulcerated lesion on the right shin with redness dry no evidence of abscess drainage Neurologic: CN 2-12 grossly intact. Sensation intact, DTR normal. Strength 5/5 in all 4.  Psychiatric: Normal judgment and insight. Alert and oriented x 3. Normal mood.   Labs on Admission: I have personally reviewed following labs and imaging studies  CBC: Recent Labs  Lab 05/06/18 1830  WBC 6.7  NEUTROABS 4.5  HGB 13.5  HCT 43.2  MCV 95.6  PLT 342   Basic Metabolic Panel: Recent Labs  Lab 05/06/18 1830  NA 142  K 3.6  CL 105  CO2 26  GLUCOSE 134*  BUN 15    CREATININE 0.72  CALCIUM 8.9   GFR: Estimated Creatinine Clearance: 70.8 mL/min (by C-G formula based on SCr of 0.72 mg/dL). Liver Function Tests: Recent Labs  Lab 05/06/18 1830  AST 23  ALT 24  ALKPHOS 84  BILITOT 1.1  PROT 5.9*  ALBUMIN 3.5   No results for input(s): LIPASE, AMYLASE in the last 168 hours. No results for input(s): AMMONIA in the last 168 hours. Coagulation Profile: Recent Labs  Lab 05/06/18 1830  INR 0.95   Cardiac  Enzymes: No results for input(s): CKTOTAL, CKMB, CKMBINDEX, TROPONINI in the last 168 hours. BNP (last 3 results) No results for input(s): PROBNP in the last 8760 hours. HbA1C: No results for input(s): HGBA1C in the last 72 hours. CBG: No results for input(s): GLUCAP in the last 168 hours. Lipid Profile: No results for input(s): CHOL, HDL, LDLCALC, TRIG, CHOLHDL, LDLDIRECT in the last 72 hours. Thyroid Function Tests: No results for input(s): TSH, T4TOTAL, FREET4, T3FREE, THYROIDAB in the last 72 hours. Anemia Panel: No results for input(s): VITAMINB12, FOLATE, FERRITIN, TIBC, IRON, RETICCTPCT in the last 72 hours. Urine analysis:    Component Value Date/Time   COLORURINE YELLOW 05/06/2018 1824   APPEARANCEUR HAZY (A) 05/06/2018 1824   LABSPEC 1.024 05/06/2018 1824   PHURINE 5.0 05/06/2018 1824   GLUCOSEU NEGATIVE 05/06/2018 1824   HGBUR MODERATE (A) 05/06/2018 1824   BILIRUBINUR NEGATIVE 05/06/2018 1824   KETONESUR NEGATIVE 05/06/2018 1824   PROTEINUR NEGATIVE 05/06/2018 1824   UROBILINOGEN 0.2 10/16/2013 1301   NITRITE NEGATIVE 05/06/2018 1824   LEUKOCYTESUR TRACE (A) 05/06/2018 1824   Sepsis Labs: @LABRCNTIP (procalcitonin:4,lacticidven:4) )No results found for this or any previous visit (from the past 240 hour(s)).   Radiological Exams on Admission: No results found.  Assessment/Plan Principal Problem:   Cellulitis Active Problems:   Esophageal reflux   Essential hypertension, benign   Obesity, unspecified    Cellulitis and abscess of right leg    #1 right lower extremities cellulitis and infected wound: Patient will be admitted for inpatient IV antibiotics.  She has failed outpatient treatment.  Wound care will be consulted.  Cultures will be obtained of the wound and tailor antibiotics based on the results.  #2 GERD: Continue PPIs from home.  #3 hypertension: Blood pressure is controlled.  Continue home regimen  #4 morbid obesity: Dietary counseling.     DVT prophylaxis: Lovenox Code Status: Full code Family Communication: Discussed with patient.  No family available Disposition Plan: Home when ready Consults called: None Admission status: Inpatient Severity of Illness: The appropriate patient status for this patient is INPATIENT. Inpatient status is judged to be reasonable and necessary in order to provide the required intensity of service to ensure the patient's safety. The patient's presenting symptoms, physical exam findings, and initial radiographic and laboratory data in the context of their chronic comorbidities is felt to place them at high risk for further clinical deterioration. Furthermore, it is not anticipated that the patient will be medically stable for discharge from the hospital within 2 midnights of admission. The following factors support the patient status of inpatient.   " The patient's presenting symptoms include Right leg ulcer and infected. " The worrisome physical exam findings include Infected ulcer. " The initial radiographic and laboratory data are worrisome because of normal labs. " The chronic co-morbidities include History of animal bite.   * I certify that at the point of admission it is my clinical judgment that the patient will require inpatient hospital care spanning beyond 2 midnights from the point of admission due to high intensity of service, high risk for further deterioration and high frequency of surveillance required.Barbette Merino MD [facility] Pager [phone]  If 7PM-7AM, please contact night-coverage [email or web address] Password [facility]  05/07/2018, 12:13 AM

## 2018-05-07 NOTE — Discharge Summary (Signed)
Physician Discharge Summary  Patricia [name] YTK:[id number] DOB: [birth year] DOA: 05/06/2018  PCP: [name], MD  Admit date: 05/06/2018 Discharge date: 05/07/2018  Admitted From: Home Disposition:  home  Recommendations for Outpatient Follow-up:  1. Follow up with PCP in 1-2 weeks 2. Consider referral to outpatient wound care  Discharge Condition:Stable CODE STATUS:Full Diet recommendation: Regular   Brief/Interim Summary: 67 y.o. female with medical history significant of hypertension, hyperlipidemia and aortic aneurysm, GERD, depression and morbid obesity who sustained cat bite on the right shin on July 4.  She was in the woods when it happened.  Patient suspected it to be the cath.  She was seen in the ED where the wound was irrigated treated including putting sutures in there.  Since then she has been on multiple oral antibiotics including Augmentin and recently doxycycline in the last 5 days.  The wound site has continued to get worse.  She noticed that it was red more than previously.  No fever or chills no nausea vomiting or diarrhea.  Pain is also at 5 out of 10.  Patient is worried and came to the ER today.  She had no cultures obtained previously.  Since patient has failed outpatient treatment she is being admitted for inpatient care.  ED Course: Temperature is 98.7 with blood pressure 142/92 pulse 88 and sats 98% room air.  White count is 6.7 hemoglobin 13.5 and platelet 245.  Electrolytes are normal glucose 134 and lactic acid 1.57. Patient given rabies immunization booster and wound care obtained in ER.  #1 right lower extremities cellulitis and infected wound: Pt was continued on IV vanc and unasyn overnight for concerns of failure of outpatient treatment. By the following AM, patient reports surrounding erythema had improved. Given previous trials of augmentin and doxycycline. Will discharge home with bactrim DS with flagyl x 10 days. Patient remained afebrile with no  leukocytosis  #2 GERD: Continued PPIs from home.  #3 hypertension: Blood pressure is controlled.  Continue home regimen  #4 morbid obesity: Dietary counseling.  Discharge Diagnoses:  Principal Problem:   Cellulitis Active Problems:   Esophageal reflux   Essential hypertension, benign   Obesity, unspecified   Cellulitis and abscess of right leg    Discharge Instructions   Allergies as of 05/07/2018      Reactions   Clindamycin/lincomycin Other (See Comments)   Stomach pain   Morphine    REACTION: itch   Tetracyclines & Related Other (See Comments)   Stomach pain      Medication List    TAKE these medications   ALPRAZolam 0.5 MG tablet Commonly known as:  XANAX Take 0.5 mg by mouth as needed for anxiety. For travel   amLODipine 5 MG tablet Commonly known as:  NORVASC Take 5 mg by mouth daily.   buPROPion 150 MG 24 hr tablet Commonly known as:  WELLBUTRIN XL Take 150 mg by mouth daily.   desvenlafaxine 50 MG 24 hr tablet Commonly known as:  PRISTIQ Take 50 mg by mouth daily.   esomeprazole 20 MG capsule Commonly known as:  NEXIUM Take 20 mg by mouth daily at 12 noon.   metroNIDAZOLE 500 MG tablet Commonly known as:  FLAGYL Take 1 tablet (500 mg total) by mouth 3 (three) times daily for 10 days.   sulfamethoxazole-trimethoprim 800-160 MG tablet Commonly known as:  BACTRIM DS,SEPTRA DS Take 1 tablet by mouth 2 (two) times daily for 10 days.   triamterene-hydrochlorothiazide 37.5-25 MG tablet Commonly known as:  MAXZIDE-25 Take 1 tablet by mouth daily.      Follow-up Information    [name], MD. Schedule an appointment as soon as possible for a visit in 1 week(s).   Specialty:  Family Medicine Contact information: [address] [place] [address] [phone]          Allergies  Allergen Reactions  . Clindamycin/Lincomycin Other (See Comments)    Stomach pain  . Morphine     REACTION: itch  . Tetracyclines &  Related Other (See Comments)    Stomach pain    Procedures/Studies: No results found.  Subjective: Eager to go home  Discharge Exam: Vitals:   05/07/18 0915 05/07/18 1313  BP: (!) 147/81 137/79  Pulse: 79 79  Resp: 18 18  Temp:  97.6 F (36.4 C)  SpO2: 98% 100%   Vitals:   05/07/18 0128 05/07/18 0547 05/07/18 0915 05/07/18 1313  BP: (!) 146/85 135/75 (!) 147/81 137/79  Pulse: 79 78 79 79  Resp: 16 16 18 18   Temp: 97.6 F (36.4 C) 97.9 F (36.6 C)  97.6 F (36.4 C)  TempSrc: Oral Oral  Oral  SpO2: 97% 94% 98% 100%  Weight:      Height:        General: Pt is alert, awake, not in acute distress Cardiovascular: RRR, S1/S2 +, no rubs, no gallops Respiratory: CTA bilaterally, no wheezing, no rhonchi Abdominal: Soft, NT, ND, bowel sounds + Extremities: no edema, no cyanosis   The results of significant diagnostics from this hospitalization (including imaging, microbiology, ancillary and laboratory) are listed below for reference.     Microbiology: Recent Results (from the past 240 hour(s))  Culture, blood (Routine x 2)     Status: None (Preliminary result)   Collection Time: 05/06/18  6:30 PM  Result Value Ref Range Status   Specimen Description BLOOD RIGHT ANTECUBITAL  Final   Special Requests   Final    BOTTLES DRAWN AEROBIC AND ANAEROBIC Blood Culture adequate volume   Culture   Final    NO GROWTH < 24 HOURS Performed at [facility], [address] [place], [place] [address]    Report Status PENDING  Incomplete  Wound or Superficial Culture     Status: None (Preliminary result)   Collection Time: 05/07/18 12:09 AM  Result Value Ref Range Status   Specimen Description WOUND RIGHT LEG  Final   Special Requests NONE  Final   Gram Stain   Final    NO WBC SEEN NO ORGANISMS SEEN Performed at [facility], [place] [address], [place], [place] [address]    Culture PENDING  Incomplete   Report Status PENDING  Incomplete     Labs: BNP (last 3  results) No results for input(s): BNP in the last 8760 hours. Basic Metabolic Panel: Recent Labs  Lab 05/06/18 1830 05/07/18 0225  NA 142 143  K 3.6 3.2*  CL 105 108  CO2 26 28  GLUCOSE 134* 115*  BUN 15 10  CREATININE 0.72 0.61  0.59  CALCIUM 8.9 8.2*   Liver Function Tests: Recent Labs  Lab 05/06/18 1830 05/07/18 0225  AST 23 20  ALT 24 20  ALKPHOS 84 72  BILITOT 1.1 0.9  PROT 5.9* 5.6*  ALBUMIN 3.5 3.0*   No results for input(s): LIPASE, AMYLASE in the last 168 hours. No results for input(s): AMMONIA in the last 168 hours. CBC: Recent Labs  Lab 05/06/18 1830 05/07/18 0225  WBC 6.7 6.6  NEUTROABS 4.5  --   HGB 13.5 12.2  HCT 43.2 38.7  MCV 95.6 95.1  PLT 245 207   Cardiac Enzymes: No results for input(s): CKTOTAL, CKMB, CKMBINDEX, TROPONINI in the last 168 hours. BNP: Invalid input(s): POCBNP CBG: No results for input(s): GLUCAP in the last 168 hours. D-Dimer No results for input(s): DDIMER in the last 72 hours. Hgb A1c No results for input(s): HGBA1C in the last 72 hours. Lipid Profile No results for input(s): CHOL, HDL, LDLCALC, TRIG, CHOLHDL, LDLDIRECT in the last 72 hours. Thyroid function studies No results for input(s): TSH, T4TOTAL, T3FREE, THYROIDAB in the last 72 hours.  Invalid input(s): FREET3 Anemia work up No results for input(s): VITAMINB12, FOLATE, FERRITIN, TIBC, IRON, RETICCTPCT in the last 72 hours. Urinalysis    Component Value Date/Time   COLORURINE YELLOW 05/06/2018 1824   APPEARANCEUR HAZY (A) 05/06/2018 1824   LABSPEC 1.024 05/06/2018 1824   PHURINE 5.0 05/06/2018 1824   GLUCOSEU NEGATIVE 05/06/2018 1824   HGBUR MODERATE (A) 05/06/2018 1824   BILIRUBINUR NEGATIVE 05/06/2018 1824   KETONESUR NEGATIVE 05/06/2018 1824   PROTEINUR NEGATIVE 05/06/2018 1824   UROBILINOGEN 0.2 10/16/2013 1301   NITRITE NEGATIVE 05/06/2018 1824   LEUKOCYTESUR TRACE (A) 05/06/2018 1824   Sepsis Labs Invalid input(s): PROCALCITONIN,  WBC,   LACTICIDVEN Microbiology Recent Results (from the past 240 hour(s))  Culture, blood (Routine x 2)     Status: None (Preliminary result)   Collection Time: 05/06/18  6:30 PM  Result Value Ref Range Status   Specimen Description BLOOD RIGHT ANTECUBITAL  Final   Special Requests   Final    BOTTLES DRAWN AEROBIC AND ANAEROBIC Blood Culture adequate volume   Culture   Final    NO GROWTH < 24 HOURS Performed at [facility], [place] [address], [place], [place] [address]    Report Status PENDING  Incomplete  Wound or Superficial Culture     Status: None (Preliminary result)   Collection Time: 05/07/18 12:09 AM  Result Value Ref Range Status   Specimen Description WOUND RIGHT LEG  Final   Special Requests NONE  Final   Gram Stain   Final    NO WBC SEEN NO ORGANISMS SEEN Performed at [facility], [place] [address] [place], [place] [address]    Culture PENDING  Incomplete   Report Status PENDING  Incomplete   Time spent: 69mn  SIGNED:   SMarylu Lund MD  [facility] 05/07/2018, 5:10 PM  If 7PM-7AM, please contact night-coverage [email or web address] Password [email or web address]

## 2018-05-08 MED FILL — Sodium Chloride IV Soln 0.9%: INTRAVENOUS | Qty: 100 | Status: AC

## 2018-05-08 MED FILL — Ampicillin & Sulbactam Sodium For Inj 1.5 (1-0.5) GM: INTRAMUSCULAR | Qty: 3 | Status: AC

## 2018-05-09 LAB — AEROBIC CULTURE  (SUPERFICIAL SPECIMEN): CULTURE: NO GROWTH

## 2018-05-09 LAB — AEROBIC CULTURE W GRAM STAIN (SUPERFICIAL SPECIMEN): Gram Stain: NONE SEEN

## 2018-05-10 ENCOUNTER — Encounter (HOSPITAL_COMMUNITY): Payer: Self-pay | Admitting: Emergency Medicine

## 2018-05-10 ENCOUNTER — Ambulatory Visit (HOSPITAL_COMMUNITY)
Admission: EM | Admit: 2018-05-10 | Discharge: 2018-05-10 | Disposition: A | Discharge status: Home / Self Care | Payer: Medicare Other | Attending: Family Medicine | Admitting: Family Medicine

## 2018-05-10 DIAGNOSIS — Z23 Encounter for immunization: Secondary | ICD-10-CM

## 2018-05-10 DIAGNOSIS — W5501XA Bitten by cat, initial encounter: Secondary | ICD-10-CM | POA: Diagnosis not present

## 2018-05-10 DIAGNOSIS — L0390 Cellulitis, unspecified: Secondary | ICD-10-CM | POA: Diagnosis not present

## 2018-05-10 DIAGNOSIS — Z203 Contact with and (suspected) exposure to rabies: Secondary | ICD-10-CM

## 2018-05-10 DIAGNOSIS — R197 Diarrhea, unspecified: Secondary | ICD-10-CM | POA: Diagnosis not present

## 2018-05-10 DIAGNOSIS — T148XXA Other injury of unspecified body region, initial encounter: Secondary | ICD-10-CM | POA: Diagnosis not present

## 2018-05-10 DIAGNOSIS — S81851A Open bite, right lower leg, initial encounter: Secondary | ICD-10-CM | POA: Diagnosis not present

## 2018-05-10 MED ORDER — RABIES VACCINE, PCEC IM SUSR
INTRAMUSCULAR | Status: AC
  Filled 2018-05-10: qty 1

## 2018-05-10 MED ORDER — RABIES VACCINE, PCEC IM SUSR
1.0000 mL | Freq: Once | INTRAMUSCULAR | Status: AC
  Administered 2018-05-10: 1 mL via INTRAMUSCULAR

## 2018-05-10 NOTE — ED Triage Notes (Signed)
Patient had an animal bite exposure and initiated wound care and rabies series on 7/20.  Patient reports a prior rabies exposure and received rabies series.    Since onset has been seen in ed for wound care and iv medication.    Patient has a script with request for rabies injection # 2 to be given today.  Spoke to dr hagler.  Dr hagler ordered rabies injection.

## 2018-05-10 NOTE — Discharge Instructions (Signed)
Return as needed

## 2018-05-10 NOTE — ED Triage Notes (Signed)
Patient wound was evaluated by pcp prior to arrival at [facility] for rabies injection.  pcp script with rabies injection request has been scanned into medical record

## 2018-05-11 LAB — CULTURE, BLOOD (ROUTINE X 2)
Culture: NO GROWTH
SPECIAL REQUESTS: ADEQUATE

## 2018-05-18 DIAGNOSIS — S81801A Unspecified open wound, right lower leg, initial encounter: Secondary | ICD-10-CM | POA: Diagnosis not present

## 2018-05-18 DIAGNOSIS — H5213 Myopia, bilateral: Secondary | ICD-10-CM | POA: Diagnosis not present

## 2018-05-18 DIAGNOSIS — Z961 Presence of intraocular lens: Secondary | ICD-10-CM | POA: Diagnosis not present

## 2018-05-18 DIAGNOSIS — H524 Presbyopia: Secondary | ICD-10-CM | POA: Diagnosis not present

## 2018-05-23 ENCOUNTER — Encounter: Payer: Self-pay | Admitting: Cardiology

## 2018-05-23 ENCOUNTER — Ambulatory Visit (INDEPENDENT_AMBULATORY_CARE_PROVIDER_SITE_OTHER): Payer: Medicare Other | Admitting: Cardiology

## 2018-05-23 VITALS — BP 128/90 | HR 82 | Ht 64.0 in | Wt 191.2 lb

## 2018-05-23 DIAGNOSIS — I77810 Thoracic aortic ectasia: Secondary | ICD-10-CM

## 2018-05-23 DIAGNOSIS — I10 Essential (primary) hypertension: Secondary | ICD-10-CM | POA: Diagnosis not present

## 2018-05-23 NOTE — Patient Instructions (Signed)
Medication Instructions:  The current medical regimen is effective;  continue present plan and medications. Please avoid the use of Cipro.  Testing/Procedures: Your physician has requested that you have an echocardiogram. Echocardiography is a painless test that uses sound waves to create images of your heart. It provides your doctor with information about the size and shape of your heart and how well your heart's chambers and valves are working. This procedure takes approximately one hour. There are no restrictions for this procedure.  Follow-Up: Follow up in 1 year with Dr. Marlou Porch.  You will receive a letter in the mail 2 months before you are due.  Please call us when you receive this letter to schedule your follow up appointment.  If you need a refill on your cardiac medications before your next appointment, please call your pharmacy.  Thank you for choosing [facility]!!

## 2018-05-23 NOTE — Progress Notes (Signed)
Cardiology Office Note    Date:  05/23/2018   ID:  Patricia LANTER, DOB July 10, 1951, MRN [id number]  PCP:  Patricia Neer, MD  Cardiologist:   [name], MD     History of Present Illness:  Patricia [name] is a 67 y.o. female here for the evaluation of thoracic aortic dilation. It is been close to 3 years since our last follow-up. LDL cholesterol also elevated at 185. She has not been taking simvastatin different points. 4.1 cm ascending aorta 06/14/13 aortic root was 4.3 cm.  She feels some dyspnea on exertion with stairs for instance which is been quite chronic for her. She also feels a fluttery like sensation in her chest which originally she described as chest pressure but tends secondarily described as a bird fluttering in her chest. This lasted a few seconds duration, relieved without any other interventions. She can go weeks upon weeks without this sensation and then all of a sudden it will come out of nowhere. This does not cause any syncope or dizziness.  Spring 2016 - Rabies shots.  Right foot surgery - open wound, Lipoma   03/18/17-recent cardiac workup has been reassuring with stress test and CT scan. Echocardiogram correlates with CT scan 4.3 cm aortic root. Mild mitral regurgitation noted. She will occasionally have chest discomfort radiating to her back and sometimes up to her right lower jaw. She takes a Tums, lays down and usually it subsides. She has seen GI in the past. Unfortunately it is hard to distinctly symptoms and last time when she had the symptoms and extensive evaluation to place as above and was reassuring.  05/23/18 - more palpitations, birds in chest, tightness in chest originally on prior visit, to back jaw hurt. Rare. No factors make . Has bump behind knee. Pain down to ankle. Bitten by wild animal 7/4. Rabies.   Past Medical History:  Diagnosis Date  . Aneurysm of aorta (HCC)   . Arthritis   . Atrophic vaginitis   . Depression   . Diverticulitis    . Edema of both legs    She takes Lasix a couple of times per week to decrease swelling.    . Fatty liver   . Hyperlipidemia   . Hypertension   . Memory difficulty   . Obesity   . Reflux   . Shingles     Past Surgical History:  Procedure Laterality Date  . ABDOMINAL HYSTERECTOMY    . AUGMENTATION MAMMAPLASTY    . Basal and Squamous cell cancers excised    . CESAREAN SECTION     X 2  . KNEE SURGERY      Current Medications: Outpatient Medications Prior to Visit  Medication Sig Dispense Refill  . ALPRAZolam (XANAX) 0.5 MG tablet Take 0.5 mg by mouth as needed for anxiety. For travel    . amLODipine (NORVASC) 5 MG tablet Take 5 mg by mouth daily.    Marland Kitchen buPROPion (WELLBUTRIN XL) 150 MG 24 hr tablet Take 150 mg by mouth daily.    Marland Kitchen desvenlafaxine (PRISTIQ) 50 MG 24 hr tablet Take 50 mg by mouth daily.    Marland Kitchen esomeprazole (NEXIUM) 20 MG capsule Take 20 mg by mouth daily at 12 noon.    . triamterene-hydrochlorothiazide (MAXZIDE-25) 37.5-25 MG per tablet Take 1 tablet by mouth daily.       No facility-administered medications prior to visit.      Allergies:   Sulfamethoxazole; Clindamycin/lincomycin; Morphine; and Tetracyclines & related   Social  History   Socioeconomic History  . Marital status: Married    Spouse name: [name]  . Number of children: 2  . Years of education: 36  . Highest education level: Not on file  Occupational History  . Occupation: Music therapist: [employer]    Comment: SELF-EMPLOYED   Social Needs  . Financial resource strain: Not on file  . Food insecurity:    Worry: Not on file    Inability: Not on file  . Transportation needs:    Medical: Not on file    Non-medical: Not on file  Tobacco Use  . Smoking status: Former Smoker    Packs/day: 1.00    Years: 25.00    Pack years: 25.00  . Smokeless tobacco: Never Used  Substance and Sexual Activity  . Alcohol use: Yes    Alcohol/week: 0.0 oz    Comment: Rare  . Drug use:  No  . Sexual activity: Yes    Birth control/protection: Surgical    Comment: HYST-1st intercourse 67 yo-Fewer than 5 partners  Lifestyle  . Physical activity:    Days per week: Not on file    Minutes per session: Not on file  . Stress: Not on file  Relationships  . Social connections:    Talks on phone: Not on file    Gets together: Not on file    Attends religious service: Not on file    Active member of club or organization: Not on file    Attends meetings of clubs or organizations: Not on file    Relationship status: Not on file  Other Topics Concern  . Not on file  Social History Narrative   Marital Status: Married Engineer, technical sales)   Children:  Sons (2)    Pets: Cat    Living Situation: Lives with husband    Occupation: Therapist, occupational - [employer]   Education: Programmer, systems    Tobacco Use/Exposure:  None    Alcohol Use:  Occasional   Drug Use:  None   Diet:  Regular   Exercise:  Bikram Yoga    Hobbies: Gardening/ Reading   Patient is right handed                 Family History:  The patient's family history includes Alzheimer's disease in her father; Bipolar disorder in her sister; Breast cancer in her cousin; Cancer in her paternal grandmother; Depression in her sister; Diabetes in her mother; Diverticulitis in her maternal grandmother, paternal grandfather, and paternal grandmother; Heart disease in her mother; Hyperlipidemia in her mother; Hypertension in her father and mother; Liver cancer in her paternal grandfather; Uterine cancer in her mother.   ROS:   Please see the history of present illness.    Review of Systems  All other systems reviewed and are negative.     PHYSICAL EXAM:   VS:  BP 128/90   Pulse 82   Ht 5' 4"  (1.626 m)   Wt 191 lb 3.2 oz (86.7 kg)   BMI 32.82 kg/m    GEN: Well nourished, well developed, in no acute distress  HEENT: normal  Neck: no JVD, carotid bruits, or masses Cardiac: RRR; soft S murmur,no rubs, or  gallops, trace LE edema, difficult to palpate distal pulses.  Respiratory:  clear to auscultation bilaterally, normal work of breathing GI: soft, nontender, nondistended, + BS MS: no deformity or atrophy  Skin: warm and dry, no rash Neuro:  Alert and  Oriented x 3, Strength and sensation are intact Psych: euthymic mood, full affect    Wt Readings from Last 3 Encounters:  05/23/18 191 lb 3.2 oz (86.7 kg)  05/06/18 186 lb (84.4 kg)  04/20/18 180 lb (81.6 kg)      Studies/Labs Reviewed:   EKG:  EKG is ordered today.  05/23/18 - sinus rhythm 82 with no other significant abnormalities personally viewed-prior 03/17/16-sinus rhythm, 68, no other abnormalities. Personally viewed  Recent Labs: 05/07/2018: ALT 20; BUN 10; Creatinine, Ser 0.59; Creatinine, Ser 0.61; Hemoglobin 12.2; Platelets 207; Potassium 3.2; Sodium 143   Lipid Panel No results found for: CHOL, TRIG, HDL, CHOLHDL, VLDL, LDLCALC, LDLDIRECT  Additional studies/ records that were reviewed today include:  Prior office notes reviewed.  Stress test, low risk 7 minutes 2010 Nuclear stress test on 01/25/17  Nuclear stress EF: 69%.  There was no ST segment deviation noted during stress.  The study is normal.  The left ventricular ejection fraction is hyperdynamic (>65%).   Normal stress nuclear study with no ischemia or infarction; EF 69 with normal wall motion.  CT scan of chest on 01/18/17-4.3 cm aorta.  Echocardiogram 04/05/16: reassuring EF. Last aortic root 4.3 cm.  mild mitral regurgitation      ASSESSMENT:    1. Dilated aortic root ([place])   2. Essential hypertension      PLAN:  In order of problems listed above:  Dilated aortic root  -Has been stable at 4.3 cm. We will monitor. Blood pressure under good control. Continue to exercise. Checking ECHO. Avoid Cipro.   Essential hypertension  - Triamterene hydrochlorothiazide. Stable. No changes.  Mildly increased diastolic today.  Continue to monitor.  Usually  normal.  Is important to check and to maintain her blood pressure especially with her dilated aortic root.  Dependent edema  - Notices after plane flight.  - Encourage leg elevation, calf exercises, compression hose. No changes.  Be seeing Dr. Donzetta Matters with vascular for suggestions.  Thankfully vascular ultrasound recently showed no V DVT.  Hyperlipidemia  - LDL 185 off of medication. Discussed merits of statin use. Does not wish to utilize. Understands risks.  No changes today.  Continue to encourage diet and exercise.  Palpitations  - Fluttering in her chest, periodic, lasting a few seconds, described as chest pressure but actually feels like a bird fluttering in her chest. This is likely PAC/PAT. No high-risk symptoms such as syncope associated with this. Continue with exercise. EKG reassuring. Prior exercise treadmill test reassuring. Prior nuclear stress test reassuring.  If symptoms were to exacerbate, low threshold for monitoring.  Atypical chest pain  - Reassurance. Stress test as well as CT scan reassuring. No change in aortic size. Unfortunately with her clinical history it is hard for discern between GI/possible cardiac and if she says chest pain radiating to her back and slightly up to her jaw this will throw up red flags.  Discussed with her once again.  Medication Adjustments/Labs and Tests Ordered: Current medicines are reviewed at length with the patient today.  Concerns regarding medicines are outlined above.  Medication changes, Labs and Tests ordered today are listed in the Patient Instructions below. Patient Instructions  Medication Instructions:  The current medical regimen is effective;  continue present plan and medications. Please avoid the use of Cipro.  Testing/Procedures: Your physician has requested that you have an echocardiogram. Echocardiography is a painless test that uses sound waves to create images of your heart. It provides your doctor with information  about the  size and shape of your heart and how well your heart's chambers and valves are working. This procedure takes approximately one hour. There are no restrictions for this procedure.  Follow-Up: Follow up in 1 year with Dr. Marlou Porch.  You will receive a letter in the mail 2 months before you are due.  Please call us when you receive this letter to schedule your follow up appointment.  If you need a refill on your cardiac medications before your next appointment, please call your pharmacy.  Thank you for choosing [facility]!!        Signed, [name], MD  05/23/2018 9:09 AM    [facility] [address], [place], [place]  [address] Phone: [phone]; Fax: [phone]

## 2018-05-26 ENCOUNTER — Ambulatory Visit (INDEPENDENT_AMBULATORY_CARE_PROVIDER_SITE_OTHER): Payer: Medicare Other | Admitting: Vascular Surgery

## 2018-05-26 ENCOUNTER — Other Ambulatory Visit: Payer: Self-pay

## 2018-05-26 ENCOUNTER — Encounter: Payer: Self-pay | Admitting: Vascular Surgery

## 2018-05-26 ENCOUNTER — Ambulatory Visit (HOSPITAL_COMMUNITY)
Admission: RE | Admit: 2018-05-26 | Discharge: 2018-05-26 | Disposition: A | Discharge status: Home / Self Care | Payer: Medicare Other | Source: Ambulatory Visit | Attending: Vascular Surgery | Admitting: Vascular Surgery

## 2018-05-26 ENCOUNTER — Encounter

## 2018-05-26 ENCOUNTER — Ambulatory Visit (HOSPITAL_BASED_OUTPATIENT_CLINIC_OR_DEPARTMENT_OTHER): Payer: Medicare Other

## 2018-05-26 VITALS — BP 136/89 | HR 79 | Resp 18 | Ht 64.0 in | Wt 189.0 lb

## 2018-05-26 DIAGNOSIS — Z87891 Personal history of nicotine dependence: Secondary | ICD-10-CM | POA: Diagnosis not present

## 2018-05-26 DIAGNOSIS — R600 Localized edema: Secondary | ICD-10-CM

## 2018-05-26 DIAGNOSIS — I77810 Thoracic aortic ectasia: Secondary | ICD-10-CM | POA: Insufficient documentation

## 2018-05-26 DIAGNOSIS — I10 Essential (primary) hypertension: Secondary | ICD-10-CM

## 2018-05-26 DIAGNOSIS — I110 Hypertensive heart disease with heart failure: Secondary | ICD-10-CM | POA: Diagnosis not present

## 2018-05-26 DIAGNOSIS — I5030 Unspecified diastolic (congestive) heart failure: Secondary | ICD-10-CM | POA: Diagnosis not present

## 2018-05-26 DIAGNOSIS — I081 Rheumatic disorders of both mitral and tricuspid valves: Secondary | ICD-10-CM | POA: Insufficient documentation

## 2018-05-26 DIAGNOSIS — E669 Obesity, unspecified: Secondary | ICD-10-CM | POA: Diagnosis not present

## 2018-05-26 LAB — ECHOCARDIOGRAM COMPLETE
Height: 64 in
WEIGHTICAEL: 3024 [oz_av]

## 2018-05-26 NOTE — Progress Notes (Signed)
Patient ID: Patricia [name], female   DOB: [birth year], 67 y.o.   MRN: [id number]  Reason for Consult: New Patient (Initial Visit) (eval L LE swelling - Dr. Paralee Cancel )   Referred by [name], MD  Subjective:     HPI:  Patricia [name] is a 67 y.o. female with history of an area of pain right below behind her left lateral knee.  She underwent MRI but could not really determine the cause.  She does have bilateral lower extremity swelling as well as spider veins.  She denies have any varicose veins.  She does not wear compression stockings she does work daily.  She was recently bitten on the leg by a small animal from the wounds and that wound is not healed since 4 July.  She had venous reflux studies prior to today's visit.  Past Medical History:  Diagnosis Date  . Aneurysm of aorta (HCC)   . Arthritis   . Atrophic vaginitis   . Depression   . Diverticulitis   . Edema of both legs    She takes Lasix a couple of times per week to decrease swelling.    . Fatty liver   . Hyperlipidemia   . Hypertension   . Memory difficulty   . Obesity   . Reflux   . Shingles    Family History  Problem Relation Age of Onset  . Uterine cancer Mother   . Heart disease Mother   . Diabetes Mother   . Hypertension Mother   . Hyperlipidemia Mother   . Alzheimer's disease Father   . Hypertension Father   . Cancer Paternal Grandmother        Gallbladder cancer  . Diverticulitis Paternal Grandmother        gallbladder  . Liver cancer Paternal Grandfather   . Diverticulitis Paternal Grandfather   . Diverticulitis Maternal Grandmother   . Breast cancer Cousin        Paternal 1st cousin Age 42  . Depression Sister   . Bipolar disorder Sister    Past Surgical History:  Procedure Laterality Date  . ABDOMINAL HYSTERECTOMY    . AUGMENTATION MAMMAPLASTY    . Basal and Squamous cell cancers excised    . CESAREAN SECTION     X 2  . KNEE SURGERY      Short Social History:    Social History   Tobacco Use  . Smoking status: Former Smoker    Packs/day: 1.00    Years: 25.00    Pack years: 25.00  . Smokeless tobacco: Never Used  Substance Use Topics  . Alcohol use: Yes    Alcohol/week: 0.0 standard drinks    Comment: Rare    Allergies  Allergen Reactions  . Sulfamethoxazole Nausea Only  . Clindamycin/Lincomycin Other (See Comments)    Stomach pain  . Morphine     REACTION: itch  . Tetracyclines & Related Other (See Comments)    Stomach pain    Current Outpatient Medications  Medication Sig Dispense Refill  . ALPRAZolam (XANAX) 0.5 MG tablet Take 0.5 mg by mouth as needed for anxiety. For travel    . amLODipine (NORVASC) 5 MG tablet Take 5 mg by mouth daily.    Marland Kitchen buPROPion (WELLBUTRIN XL) 150 MG 24 hr tablet Take 150 mg by mouth daily.    Marland Kitchen desvenlafaxine (PRISTIQ) 50 MG 24 hr tablet Take 50 mg by mouth daily.    Marland Kitchen esomeprazole (NEXIUM) 20 MG capsule Take 20 mg  by mouth daily at 12 noon.    . triamterene-hydrochlorothiazide (MAXZIDE-25) 37.5-25 MG per tablet Take 1 tablet by mouth daily.       No current facility-administered medications for this visit.     Review of Systems  Constitutional:  Constitutional negative. HENT: HENT negative.  Eyes: Eyes negative.  Respiratory: Respiratory negative.  Cardiovascular: Positive for leg swelling.  GI: Gastrointestinal negative.  Musculoskeletal: Positive for leg pain.  Skin: Positive for wound.  Neurological: Neurological negative. Hematologic: Hematologic/lymphatic negative.  Psychiatric: Psychiatric negative.        Objective:  Objective   Vitals:   05/26/18 1326  BP: 136/89  Pulse: 79  Resp: 18  SpO2: 96%  Weight: 189 lb (85.7 kg)  Height: 5' 4"  (1.626 m)   Body mass index is 32.44 kg/m.  Physical Exam  Constitutional: She appears well-developed.  HENT:  Head: Normocephalic.  Eyes: Pupils are equal, round, and reactive to light.  Neck: Normal range of motion. Neck supple.   Cardiovascular: Normal rate.  Pulses:      Radial pulses are 2+ on the right side, and 2+ on the left side.       Popliteal pulses are 2+ on the right side, and 2+ on the left side.       Dorsalis pedis pulses are 2+ on the right side, and 2+ on the left side.       Posterior tibial pulses are 2+ on the right side, and 2+ on the left side.  Abdominal: Soft.  Musculoskeletal: She exhibits edema.  Skin: Skin is warm and dry.  There is a 3 cm wound with Silvadene anterior right leg  Psychiatric: She has a normal mood and affect. Her behavior is normal. Thought content normal.    Data: I have independently interpreted her venous reflux studies which demonstrate somewhat large veins at 0.7 cm bilaterally in the proximal thigh she has no evidence of greater saphenous reflux.     Assessment/Plan:     67 year old female with bilateral lower extremity edema and spider veins with C2 venous disease.  She has an area behind her left knee that does give her some discomfort but I cannot palpate any abnormalities nor see any on MRI that was brought with her today.  She does not have any venous reflux.  She has palpable pulses in her bilateral lower extremities at the [facility] and PT.  From the standpoint she is okay for compression which may help her swelling and also may help her heal her wound on the right.  She is going to see the [facility].  There is no vascular intervention necessary for wound healing at this time.     Waynetta Sandy MD [facility]

## 2018-05-29 DIAGNOSIS — I890 Lymphedema, not elsewhere classified: Secondary | ICD-10-CM | POA: Diagnosis not present

## 2018-05-29 DIAGNOSIS — M25562 Pain in left knee: Secondary | ICD-10-CM | POA: Diagnosis not present

## 2018-05-29 DIAGNOSIS — I87331 Chronic venous hypertension (idiopathic) with ulcer and inflammation of right lower extremity: Secondary | ICD-10-CM | POA: Diagnosis not present

## 2018-05-29 DIAGNOSIS — L97919 Non-pressure chronic ulcer of unspecified part of right lower leg with unspecified severity: Secondary | ICD-10-CM | POA: Diagnosis not present

## 2018-05-30 DIAGNOSIS — M25562 Pain in left knee: Secondary | ICD-10-CM | POA: Diagnosis not present

## 2018-06-01 DIAGNOSIS — R3121 Asymptomatic microscopic hematuria: Secondary | ICD-10-CM | POA: Diagnosis not present

## 2018-06-01 DIAGNOSIS — R350 Frequency of micturition: Secondary | ICD-10-CM | POA: Diagnosis not present

## 2018-06-01 DIAGNOSIS — N393 Stress incontinence (female) (male): Secondary | ICD-10-CM | POA: Diagnosis not present

## 2018-06-01 DIAGNOSIS — R351 Nocturia: Secondary | ICD-10-CM | POA: Diagnosis not present

## 2018-06-02 ENCOUNTER — Encounter: Payer: Self-pay | Admitting: Orthopedic Surgery

## 2018-06-12 DIAGNOSIS — I87331 Chronic venous hypertension (idiopathic) with ulcer and inflammation of right lower extremity: Secondary | ICD-10-CM | POA: Diagnosis not present

## 2018-06-12 DIAGNOSIS — L97919 Non-pressure chronic ulcer of unspecified part of right lower leg with unspecified severity: Secondary | ICD-10-CM | POA: Diagnosis not present

## 2018-06-12 DIAGNOSIS — I872 Venous insufficiency (chronic) (peripheral): Secondary | ICD-10-CM | POA: Diagnosis not present

## 2018-06-12 DIAGNOSIS — L97812 Non-pressure chronic ulcer of other part of right lower leg with fat layer exposed: Secondary | ICD-10-CM | POA: Diagnosis not present

## 2018-06-12 DIAGNOSIS — I890 Lymphedema, not elsewhere classified: Secondary | ICD-10-CM | POA: Diagnosis not present

## 2018-06-12 DIAGNOSIS — L97912 Non-pressure chronic ulcer of unspecified part of right lower leg with fat layer exposed: Secondary | ICD-10-CM | POA: Diagnosis not present

## 2018-06-16 DIAGNOSIS — N289 Disorder of kidney and ureter, unspecified: Secondary | ICD-10-CM | POA: Diagnosis not present

## 2018-06-16 DIAGNOSIS — R3121 Asymptomatic microscopic hematuria: Secondary | ICD-10-CM | POA: Diagnosis not present

## 2018-06-16 DIAGNOSIS — N200 Calculus of kidney: Secondary | ICD-10-CM | POA: Diagnosis not present

## 2018-06-16 DIAGNOSIS — K5780 Diverticulitis of intestine, part unspecified, with perforation and abscess without bleeding: Secondary | ICD-10-CM | POA: Diagnosis not present

## 2018-06-17 DIAGNOSIS — S41101A Unspecified open wound of right upper arm, initial encounter: Secondary | ICD-10-CM | POA: Diagnosis not present

## 2018-06-17 DIAGNOSIS — W5503XA Scratched by cat, initial encounter: Secondary | ICD-10-CM | POA: Diagnosis not present

## 2018-06-20 DIAGNOSIS — I87331 Chronic venous hypertension (idiopathic) with ulcer and inflammation of right lower extremity: Secondary | ICD-10-CM | POA: Diagnosis not present

## 2018-06-20 DIAGNOSIS — L97919 Non-pressure chronic ulcer of unspecified part of right lower leg with unspecified severity: Secondary | ICD-10-CM | POA: Diagnosis not present

## 2018-06-20 DIAGNOSIS — L97812 Non-pressure chronic ulcer of other part of right lower leg with fat layer exposed: Secondary | ICD-10-CM | POA: Diagnosis not present

## 2018-06-20 DIAGNOSIS — E881 Lipodystrophy, not elsewhere classified: Secondary | ICD-10-CM | POA: Diagnosis not present

## 2018-06-20 DIAGNOSIS — I890 Lymphedema, not elsewhere classified: Secondary | ICD-10-CM | POA: Diagnosis not present

## 2018-06-26 DIAGNOSIS — L97919 Non-pressure chronic ulcer of unspecified part of right lower leg with unspecified severity: Secondary | ICD-10-CM | POA: Diagnosis not present

## 2018-06-26 DIAGNOSIS — L97212 Non-pressure chronic ulcer of right calf with fat layer exposed: Secondary | ICD-10-CM | POA: Diagnosis not present

## 2018-06-26 DIAGNOSIS — I87331 Chronic venous hypertension (idiopathic) with ulcer and inflammation of right lower extremity: Secondary | ICD-10-CM | POA: Diagnosis not present

## 2018-07-03 DIAGNOSIS — I890 Lymphedema, not elsewhere classified: Secondary | ICD-10-CM | POA: Diagnosis not present

## 2018-07-03 DIAGNOSIS — L97919 Non-pressure chronic ulcer of unspecified part of right lower leg with unspecified severity: Secondary | ICD-10-CM | POA: Diagnosis not present

## 2018-07-03 DIAGNOSIS — I87331 Chronic venous hypertension (idiopathic) with ulcer and inflammation of right lower extremity: Secondary | ICD-10-CM | POA: Diagnosis not present

## 2018-07-03 DIAGNOSIS — L97911 Non-pressure chronic ulcer of unspecified part of right lower leg limited to breakdown of skin: Secondary | ICD-10-CM | POA: Diagnosis not present

## 2018-07-07 DIAGNOSIS — M1712 Unilateral primary osteoarthritis, left knee: Secondary | ICD-10-CM | POA: Diagnosis not present

## 2018-07-07 DIAGNOSIS — M25562 Pain in left knee: Secondary | ICD-10-CM | POA: Diagnosis not present

## 2018-07-07 DIAGNOSIS — M170 Bilateral primary osteoarthritis of knee: Secondary | ICD-10-CM | POA: Diagnosis not present

## 2018-07-07 DIAGNOSIS — M25561 Pain in right knee: Secondary | ICD-10-CM | POA: Diagnosis not present

## 2018-07-10 DIAGNOSIS — L97812 Non-pressure chronic ulcer of other part of right lower leg with fat layer exposed: Secondary | ICD-10-CM | POA: Diagnosis not present

## 2018-07-10 DIAGNOSIS — I890 Lymphedema, not elsewhere classified: Secondary | ICD-10-CM | POA: Diagnosis not present

## 2018-07-10 DIAGNOSIS — L97919 Non-pressure chronic ulcer of unspecified part of right lower leg with unspecified severity: Secondary | ICD-10-CM | POA: Diagnosis not present

## 2018-07-10 DIAGNOSIS — I87331 Chronic venous hypertension (idiopathic) with ulcer and inflammation of right lower extremity: Secondary | ICD-10-CM | POA: Diagnosis not present

## 2018-07-13 DIAGNOSIS — M1712 Unilateral primary osteoarthritis, left knee: Secondary | ICD-10-CM | POA: Diagnosis not present

## 2018-07-18 DIAGNOSIS — I878 Other specified disorders of veins: Secondary | ICD-10-CM | POA: Insufficient documentation

## 2018-07-18 DIAGNOSIS — L97919 Non-pressure chronic ulcer of unspecified part of right lower leg with unspecified severity: Secondary | ICD-10-CM | POA: Diagnosis not present

## 2018-07-18 DIAGNOSIS — I890 Lymphedema, not elsewhere classified: Secondary | ICD-10-CM | POA: Diagnosis not present

## 2018-07-18 DIAGNOSIS — I87331 Chronic venous hypertension (idiopathic) with ulcer and inflammation of right lower extremity: Secondary | ICD-10-CM | POA: Diagnosis not present

## 2018-07-21 DIAGNOSIS — M1712 Unilateral primary osteoarthritis, left knee: Secondary | ICD-10-CM | POA: Diagnosis not present

## 2018-07-21 DIAGNOSIS — M25562 Pain in left knee: Secondary | ICD-10-CM | POA: Diagnosis not present

## 2018-07-25 DIAGNOSIS — L820 Inflamed seborrheic keratosis: Secondary | ICD-10-CM | POA: Diagnosis not present

## 2018-07-25 DIAGNOSIS — Z85828 Personal history of other malignant neoplasm of skin: Secondary | ICD-10-CM | POA: Diagnosis not present

## 2018-07-26 DIAGNOSIS — N201 Calculus of ureter: Secondary | ICD-10-CM | POA: Diagnosis not present

## 2018-08-01 DIAGNOSIS — N200 Calculus of kidney: Secondary | ICD-10-CM | POA: Diagnosis not present

## 2018-08-01 DIAGNOSIS — R3121 Asymptomatic microscopic hematuria: Secondary | ICD-10-CM | POA: Diagnosis not present

## 2018-08-04 DIAGNOSIS — M25561 Pain in right knee: Secondary | ICD-10-CM | POA: Diagnosis not present

## 2018-08-04 DIAGNOSIS — L309 Dermatitis, unspecified: Secondary | ICD-10-CM | POA: Diagnosis not present

## 2018-08-04 DIAGNOSIS — L298 Other pruritus: Secondary | ICD-10-CM | POA: Diagnosis not present

## 2018-08-04 DIAGNOSIS — Z85828 Personal history of other malignant neoplasm of skin: Secondary | ICD-10-CM | POA: Diagnosis not present

## 2018-08-04 DIAGNOSIS — M1711 Unilateral primary osteoarthritis, right knee: Secondary | ICD-10-CM | POA: Diagnosis not present

## 2018-08-04 DIAGNOSIS — L820 Inflamed seborrheic keratosis: Secondary | ICD-10-CM | POA: Diagnosis not present

## 2018-08-07 DIAGNOSIS — R5383 Other fatigue: Secondary | ICD-10-CM | POA: Insufficient documentation

## 2018-08-07 DIAGNOSIS — E669 Obesity, unspecified: Secondary | ICD-10-CM | POA: Diagnosis not present

## 2018-08-07 DIAGNOSIS — Z23 Encounter for immunization: Secondary | ICD-10-CM | POA: Diagnosis not present

## 2018-08-14 ENCOUNTER — Telehealth: Payer: Self-pay

## 2018-08-14 NOTE — Telephone Encounter (Signed)
   Primary Cardiologist: [name], MD  Chart reviewed as part of pre-operative protocol coverage. Patient was contacted 08/14/2018 in reference to pre-operative risk assessment for pending surgery as outlined below.  Patricia [name] was last seen on 05/23/18 by Dr. Marlou Porch.  Since that day, Patricia [name] has done okay. She continues to have "bubble type sensation in epigastric area radiating to her jaw". This been chronic for many years. No regular exercise. I have personally reviewed her case with Dr. Marlou Porch. She had reassuring stress test 01/2017. Recent echo 05/2018 with normal LVEF with Aortic root 4.5 cm.   Therefore, based on ACC/AHA guidelines, the patient would be at acceptable risk for the planned procedure without further cardiovascular testing. Watch BP postoperatively and any sign of arrhthymias given non specific palpitations.   I will route this recommendation to the requesting party via Epic fax function and remove from pre-op pool.  Please call with questions.  [place], [place] 08/14/2018, 3:08 PM

## 2018-08-14 NOTE — Telephone Encounter (Signed)
   [facility] Pre-operative Risk Assessment    Request for surgical clearance:  1. What type of surgery is being performed? Left total knee arthroplasty  2. When is this surgery scheduled? 09/26/18   3. What type of clearance is required (medical clearance vs. Pharmacy clearance to hold med vs. Both)? Medical clearance  4. Are there any medications that need to be held prior to surgery and how long? None requested   5. Practice name and name of physician performing surgery? Dr. Mia Creek at [facility]   6. What is your office phone number  [phone]    7.   What is your office fax number  [phone]  8.   Anesthesia type (None, local, MAC, general) ? spinal   Damian Leavell 08/14/2018, 9:56 AM  _________________________________________________________________   (provider comments below)

## 2018-08-28 DIAGNOSIS — M25562 Pain in left knee: Secondary | ICD-10-CM | POA: Diagnosis not present

## 2018-08-31 DIAGNOSIS — L298 Other pruritus: Secondary | ICD-10-CM | POA: Diagnosis not present

## 2018-08-31 DIAGNOSIS — L821 Other seborrheic keratosis: Secondary | ICD-10-CM | POA: Diagnosis not present

## 2018-08-31 DIAGNOSIS — L820 Inflamed seborrheic keratosis: Secondary | ICD-10-CM | POA: Diagnosis not present

## 2018-08-31 DIAGNOSIS — Z85828 Personal history of other malignant neoplasm of skin: Secondary | ICD-10-CM | POA: Diagnosis not present

## 2018-09-04 DIAGNOSIS — N200 Calculus of kidney: Secondary | ICD-10-CM | POA: Diagnosis not present

## 2018-09-06 DIAGNOSIS — M1712 Unilateral primary osteoarthritis, left knee: Secondary | ICD-10-CM | POA: Insufficient documentation

## 2018-09-07 ENCOUNTER — Other Ambulatory Visit: Payer: Self-pay | Admitting: Orthopedic Surgery

## 2018-09-07 NOTE — Care Plan (Unsigned)
Ortho Bundle L TKA scheduled on 09-22-18 DCP:  Home with son.  1 story home with 3 ste. DME:  No needs.  Has a RW and 3-in-1 PT:  Virtual PT

## 2018-09-11 DIAGNOSIS — E669 Obesity, unspecified: Secondary | ICD-10-CM | POA: Diagnosis not present

## 2018-09-11 NOTE — H&P (Signed)
TOTAL KNEE ADMISSION H&P  Patient is being admitted for left total knee arthroplasty.  Subjective:  Chief Complaint:    Left knee primary OA / pain  HPI: Patricia [name], 67 y.o. female, has a history of pain and functional disability in the left knee due to arthritis and has failed non-surgical conservative treatments for greater than 12 weeks to include NSAID's and/or analgesics, corticosteriod injections, viscosupplementation injections and activity modification.  Onset of symptoms was gradual, starting >10 years ago with gradually worsening course since that time. The patient noted prior procedures on the knee to include  arthroscopy and menisectomy on the left knee(s).  Patient currently rates pain in the left knee(s) at 7 out of 10 with activity. Patient has night pain, worsening of pain with activity and weight bearing, pain that interferes with activities of daily living, pain with passive range of motion, crepitus and joint swelling.  Patient has evidence of periarticular osteophytes and joint space narrowing by imaging studies.  There is no active infection.  Risks, benefits and expectations were discussed with the patient.  Risks including but not limited to the risk of anesthesia, blood clots, nerve damage, blood vessel damage, failure of the prosthesis, infection and up to and including death.  Patient understand the risks, benefits and expectations and wishes to proceed with surgery.   PCP: [name], MD  D/C Plans:       Home  Post-op Meds:       No Rx given   Tranexamic Acid:      To be given - IV   Decadron:      Is to be given  FYI:      ASA  Norco  DME:   Rx given for - RW   PT:   OPPT Rx given   Patient Active Problem List   Diagnosis Date Noted  . Cellulitis 05/07/2018  . Cellulitis and abscess of right leg 05/07/2018  . Essential hypertension, benign 02/26/2013  . Obesity, unspecified 02/26/2013  . Aneurysm of aorta (HCC)   . Depression   . Fatty  liver   . Reflux   . Atrophic vaginitis   . DYSPNEA 06/04/2009  . HYPERLIPIDEMIA 06/03/2009  . HYPERTENSION 06/03/2009  . MITRAL VALVE PROLAPSE 06/03/2009  . Esophageal reflux 06/03/2009   Past Medical History:  Diagnosis Date  . Aneurysm of aorta (HCC)   . Arthritis   . Atrophic vaginitis   . Depression   . Diverticulitis   . Edema of both legs    She takes Lasix a couple of times per week to decrease swelling.    . Fatty liver   . Hyperlipidemia   . Hypertension   . Memory difficulty   . Obesity   . Reflux   . Shingles     Past Surgical History:  Procedure Laterality Date  . ABDOMINAL HYSTERECTOMY    . AUGMENTATION MAMMAPLASTY    . Basal and Squamous cell cancers excised    . CESAREAN SECTION     X 2  . KNEE SURGERY      No current facility-administered medications for this encounter.    Current Outpatient Medications  Medication Sig Dispense Refill Last Dose  . ALPRAZolam (XANAX) 0.5 MG tablet Take 0.5 mg by mouth as needed for anxiety. For travel   Taking  . amLODipine (NORVASC) 5 MG tablet Take 5 mg by mouth daily.   Taking  . buPROPion (WELLBUTRIN XL) 150 MG 24 hr tablet Take 150 mg by  mouth daily.   Taking  . desvenlafaxine (PRISTIQ) 50 MG 24 hr tablet Take 50 mg by mouth daily.   Taking  . esomeprazole (NEXIUM) 20 MG capsule Take 20 mg by mouth daily at 12 noon.   Taking  . triamterene-hydrochlorothiazide (MAXZIDE-25) 37.5-25 MG per tablet Take 1 tablet by mouth daily.     Taking   Allergies  Allergen Reactions  . Sulfamethoxazole Nausea Only  . Clindamycin/Lincomycin Other (See Comments)    Stomach pain  . Morphine     REACTION: itch  . Tetracyclines & Related Other (See Comments)    Stomach pain    Social History   Tobacco Use  . Smoking status: Former Smoker    Packs/day: 1.00    Years: 25.00    Pack years: 25.00  . Smokeless tobacco: Never Used  Substance Use Topics  . Alcohol use: Yes    Alcohol/week: 0.0 standard drinks    Comment: Rare     Family History  Problem Relation Age of Onset  . Uterine cancer Mother   . Heart disease Mother   . Diabetes Mother   . Hypertension Mother   . Hyperlipidemia Mother   . Alzheimer's disease Father   . Hypertension Father   . Cancer Paternal Grandmother        Gallbladder cancer  . Diverticulitis Paternal Grandmother        gallbladder  . Liver cancer Paternal Grandfather   . Diverticulitis Paternal Grandfather   . Diverticulitis Maternal Grandmother   . Breast cancer Cousin        Paternal 1st cousin Age 59  . Depression Sister   . Bipolar disorder Sister      Review of Systems  Constitutional: Negative.   HENT: Negative.   Eyes: Negative.   Respiratory: Negative.   Cardiovascular: Negative.   Gastrointestinal: Positive for heartburn.  Genitourinary: Negative.   Musculoskeletal: Positive for joint pain.  Skin: Negative.   Neurological: Negative.   Endo/Heme/Allergies: Negative.   Psychiatric/Behavioral: Positive for depression.    Objective:  Physical Exam  Constitutional: She is oriented to person, place, and time. She appears well-developed.  HENT:  Head: Normocephalic.  Eyes: Pupils are equal, round, and reactive to light.  Neck: Neck supple. No JVD present. No tracheal deviation present. No thyromegaly present.  Cardiovascular: Normal rate, regular rhythm and intact distal pulses.  Respiratory: Effort normal and breath sounds normal. No respiratory distress. She has no wheezes.  GI: Soft. There is no tenderness. There is no guarding.  Musculoskeletal:       Left knee: She exhibits decreased range of motion, swelling and bony tenderness. She exhibits no ecchymosis, no deformity, no laceration and no erythema. Tenderness found.  Lymphadenopathy:    She has no cervical adenopathy.  Neurological: She is alert and oriented to person, place, and time. A sensory deficit (bilateral foot numbness, results after previous surgeries) is present.  Skin: Skin is warm  and dry.  Psychiatric: She has a normal mood and affect.      Labs:  Estimated body mass index is 32.44 kg/m as calculated from the following:   Height as of 05/26/18: 5' 4"  (1.626 m).   Weight as of 05/26/18: 85.7 kg.   Imaging Review Plain radiographs demonstrate severe degenerative joint disease of the left knee.  The bone quality appears to be good for age and reported activity level.   Preoperative templating of the joint replacement has been completed, documented, and submitted to the Operating Room personnel  in order to optimize intra-operative equipment management.    Patient's anticipated LOS is less than 2 midnights, meeting these requirements: - Lives within 1 hour of care - Has a competent adult at home to recover with post-op recover - NO history of  - Chronic pain requiring opiods  - Diabetes  - Coronary Artery Disease  - Heart failure  - Heart attack  - Stroke  - DVT/VTE  - Cardiac arrhythmia  - Respiratory Failure/COPD  - Renal failure  - Anemia  - Advanced Liver disease     Assessment/Plan:  End stage arthritis, left knee   The patient history, physical examination, clinical judgment of the provider and imaging studies are consistent with end stage degenerative joint disease of the left knee and total knee arthroplasty is deemed medically necessary. The treatment options including medical management, injection therapy arthroscopy and arthroplasty were discussed at length. The risks and benefits of total knee arthroplasty were presented and reviewed. The risks due to aseptic loosening, infection, stiffness, patella tracking problems, thromboembolic complications and other imponderables were discussed. The patient acknowledged the explanation, agreed to proceed with the plan and consent was signed. Patient is being admitted for inpatient treatment for surgery, pain control, PT, OT, prophylactic antibiotics, VTE prophylaxis, progressive ambulation and ADL's and  discharge planning. The patient is planning to be discharged home.     [place] P[place] Bodhi Stenglein   PA-C  09/11/2018, 7:58 AM

## 2018-09-12 NOTE — Progress Notes (Addendum)
08/14/2018- clearance from Dr. Curly Shores on chart and in [place]  05/26/2018- noted in [place]  05/23/2018- noted in [place] and Office note from Dr. Marlou Porch

## 2018-09-12 NOTE — Patient Instructions (Signed)
Patricia [name]  09/12/2018   Your procedure is scheduled on: Tuesday 09/26/2018  Report to [facility] Main  Entrance              Report to admitting at   0530 AM    Call this number if you have problems the morning of surgery [phone]    Remember: Do not eat food or drink liquids :After Midnight.              BRUSH YOUR TEETH MORNING OF SURGERY AND RINSE YOUR MOUTH OUT, NO CHEWING GUM CANDY OR MINTS.     Take these medicines the morning of surgery with A SIP OF WATER: Amlodipine (norvasc), Bupropion (Wellbutrin XL), Desvenlafaxine (Pristiq), Esomeprazole (Nexium)                                  You may not have any metal on your body including hair pins and              piercings  Do not wear jewelry, make-up, lotions, powders or perfumes, deodorant             Do not wear nail polish.  Do not shave  48 hours prior to surgery.               Do not bring valuables to the hospital. [place].  Contacts, dentures or bridgework may not be worn into surgery.  Leave suitcase in the car. After surgery it may be brought to your room.                  Please read over the following fact sheets you were given: _____________________________________________________________________             [facility] - Preparing for Surgery Before surgery, you can play an important role.  Because skin is not sterile, your skin needs to be as free of germs as possible.  You can reduce the number of germs on your skin by washing with CHG (chlorahexidine gluconate) soap before surgery.  CHG is an antiseptic cleaner which kills germs and bonds with the skin to continue killing germs even after washing. Please DO NOT use if you have an allergy to CHG or antibacterial soaps.  If your skin becomes reddened/irritated stop using the CHG and inform your nurse when you arrive at Short Stay. Do not shave (including legs and  underarms) for at least 48 hours prior to the first CHG shower.  You may shave your face/neck. Please follow these instructions carefully:  1.  Shower with CHG Soap the night before surgery and the  morning of Surgery.  2.  If you choose to wash your hair, wash your hair first as usual with your  normal  shampoo.  3.  After you shampoo, rinse your hair and body thoroughly to remove the  shampoo.                           4.  Use CHG as you would any other liquid soap.  You can apply chg directly  to the skin and wash  Gently with a scrungie or clean washcloth.  5.  Apply the CHG Soap to your body ONLY FROM THE NECK DOWN.   Do not use on face/ open                           Wound or open sores. Avoid contact with eyes, ears mouth and genitals (private parts).                       Wash face,  Genitals (private parts) with your normal soap.             6.  Wash thoroughly, paying special attention to the area where your surgery  will be performed.  7.  Thoroughly rinse your body with warm water from the neck down.  8.  DO NOT shower/wash with your normal soap after using and rinsing off  the CHG Soap.                9.  Pat yourself dry with a clean towel.            10.  Wear clean pajamas.            11.  Place clean sheets on your bed the night of your first shower and do not  sleep with pets. Day of Surgery : Do not apply any lotions/deodorants the morning of surgery.  Please wear clean clothes to the hospital/surgery center.  FAILURE TO FOLLOW THESE INSTRUCTIONS MAY RESULT IN THE CANCELLATION OF YOUR SURGERY PATIENT SIGNATURE_________________________________  NURSE SIGNATURE__________________________________  ________________________________________________________________________   Adam Phenix  An incentive spirometer is a tool that can help keep your lungs clear and active. This tool measures how well you are filling your lungs with each breath. Taking  long deep breaths may help reverse or decrease the chance of developing breathing (pulmonary) problems (especially infection) following:  A long period of time when you are unable to move or be active. BEFORE THE PROCEDURE   If the spirometer includes an indicator to show your best effort, your nurse or respiratory therapist will set it to a desired goal.  If possible, sit up straight or lean slightly forward. Try not to slouch.  Hold the incentive spirometer in an upright position. INSTRUCTIONS FOR USE  1. Sit on the edge of your bed if possible, or sit up as far as you can in bed or on a chair. 2. Hold the incentive spirometer in an upright position. 3. Breathe out normally. 4. Place the mouthpiece in your mouth and seal your lips tightly around it. 5. Breathe in slowly and as deeply as possible, raising the piston or the ball toward the top of the column. 6. Hold your breath for 3-5 seconds or for as long as possible. Allow the piston or ball to fall to the bottom of the column. 7. Remove the mouthpiece from your mouth and breathe out normally. 8. Rest for a few seconds and repeat Steps 1 through 7 at least 10 times every 1-2 hours when you are awake. Take your time and take a few normal breaths between deep breaths. 9. The spirometer may include an indicator to show your best effort. Use the indicator as a goal to work toward during each repetition. 10. After each set of 10 deep breaths, practice coughing to be sure your lungs are clear. If you have an incision (the cut made at the time of surgery),  support your incision when coughing by placing a pillow or rolled up towels firmly against it. Once you are able to get out of bed, walk around indoors and cough well. You may stop using the incentive spirometer when instructed by your caregiver.  RISKS AND COMPLICATIONS  Take your time so you do not get dizzy or light-headed.  If you are in pain, you may need to take or ask for pain  medication before doing incentive spirometry. It is harder to take a deep breath if you are having pain. AFTER USE  Rest and breathe slowly and easily.  It can be helpful to keep track of a log of your progress. Your caregiver can provide you with a simple table to help with this. If you are using the spirometer at home, follow these instructions: [place] IF:   You are having difficultly using the spirometer.  You have trouble using the spirometer as often as instructed.  Your pain medication is not giving enough relief while using the spirometer.  You develop fever of 100.5 F (38.1 C) or higher. SEEK IMMEDIATE MEDICAL CARE IF:   You cough up bloody sputum that had not been present before.  You develop fever of 102 F (38.9 C) or greater.  You develop worsening pain at or near the incision site. MAKE SURE YOU:   Understand these instructions.  Will watch your condition.  Will get help right away if you are not doing well or get worse. Document Released: 02/14/2007 Document Revised: 12/27/2011 Document Reviewed: 04/17/2007 Exit[employer] Patient Information 2014 Exit[employer], [place].   ________________________________________________________________________  WHAT IS A BLOOD TRANSFUSION? Blood Transfusion Information  A transfusion is the replacement of blood or some of its parts. Blood is made up of multiple cells which provide different functions.  Red blood cells carry oxygen and are used for blood loss replacement.  White blood cells fight against infection.  Platelets control bleeding.  Plasma helps clot blood.  Other blood products are available for specialized needs, such as hemophilia or other clotting disorders. BEFORE THE TRANSFUSION  Who gives blood for transfusions?   Healthy volunteers who are fully evaluated to make sure their blood is safe. This is blood bank blood. Transfusion therapy is the safest it has ever been in the practice of medicine.  Before blood is taken from a donor, a complete history is taken to make sure that person has no history of diseases nor engages in risky social behavior (examples are intravenous drug use or sexual activity with multiple partners). The donor's travel history is screened to minimize risk of transmitting infections, such as malaria. The donated blood is tested for signs of infectious diseases, such as HIV and hepatitis. The blood is then tested to be sure it is compatible with you in order to minimize the chance of a transfusion reaction. If you or a relative donates blood, this is often done in anticipation of surgery and is not appropriate for emergency situations. It takes many days to process the donated blood. RISKS AND COMPLICATIONS Although transfusion therapy is very safe and saves many lives, the main dangers of transfusion include:   Getting an infectious disease.  Developing a transfusion reaction. This is an allergic reaction to something in the blood you were given. Every precaution is taken to prevent this. The decision to have a blood transfusion has been considered carefully by your caregiver before blood is given. Blood is not given unless the benefits outweigh the risks. AFTER THE TRANSFUSION  Right after receiving a blood transfusion, you will usually feel much better and more energetic. This is especially true if your red blood cells have gotten low (anemic). The transfusion raises the level of the red blood cells which carry oxygen, and this usually causes an energy increase.  The nurse administering the transfusion will monitor you carefully for complications. HOME CARE INSTRUCTIONS  No special instructions are needed after a transfusion. You may find your energy is better. Speak with your caregiver about any limitations on activity for underlying diseases you may have. SEEK MEDICAL CARE IF:   Your condition is not improving after your transfusion.  You develop redness or  irritation at the intravenous (IV) site. SEEK IMMEDIATE MEDICAL CARE IF:  Any of the following symptoms occur over the next 12 hours:  Shaking chills.  You have a temperature by mouth above 102 F (38.9 C), not controlled by medicine.  Chest, back, or muscle pain.  People around you feel you are not acting correctly or are confused.  Shortness of breath or difficulty breathing.  Dizziness and fainting.  You get a rash or develop hives.  You have a decrease in urine output.  Your urine turns a dark color or changes to pink, red, or brown. Any of the following symptoms occur over the next 10 days:  You have a temperature by mouth above 102 F (38.9 C), not controlled by medicine.  Shortness of breath.  Weakness after normal activity.  The white part of the eye turns yellow (jaundice).  You have a decrease in the amount of urine or are urinating less often.  Your urine turns a dark color or changes to pink, red, or brown. Document Released: 10/01/2000 Document Revised: 12/27/2011 Document Reviewed: 05/20/2008 [facility] Patient Information 2014 [place], [place].  _______________________________________________________________________

## 2018-09-13 ENCOUNTER — Encounter (HOSPITAL_COMMUNITY)
Admission: RE | Admit: 2018-09-13 | Discharge: 2018-09-13 | Disposition: A | Discharge status: Home / Self Care | Payer: Medicare Other | Source: Ambulatory Visit | Attending: Orthopedic Surgery | Admitting: Orthopedic Surgery

## 2018-09-13 ENCOUNTER — Other Ambulatory Visit (HOSPITAL_COMMUNITY): Payer: Self-pay | Admitting: *Deleted

## 2018-09-13 ENCOUNTER — Other Ambulatory Visit: Payer: Self-pay

## 2018-09-13 ENCOUNTER — Encounter (HOSPITAL_COMMUNITY): Payer: Self-pay

## 2018-09-13 DIAGNOSIS — Z01812 Encounter for preprocedural laboratory examination: Secondary | ICD-10-CM | POA: Diagnosis not present

## 2018-09-13 HISTORY — DX: Other specified disorders of veins: I87.8

## 2018-09-13 HISTORY — DX: Lymphedema, not elsewhere classified: I89.0

## 2018-09-13 HISTORY — DX: Actinic keratosis: L57.0

## 2018-09-13 LAB — CBC
HEMATOCRIT: 46.6 % — AB (ref 36.0–46.0)
HEMOGLOBIN: 14.7 g/dL (ref 12.0–15.0)
MCH: 29.7 pg (ref 26.0–34.0)
MCHC: 31.5 g/dL (ref 30.0–36.0)
MCV: 94.1 fL (ref 80.0–100.0)
Platelets: 289 10*3/uL (ref 150–400)
RBC: 4.95 MIL/uL (ref 3.87–5.11)
RDW: 12.6 % (ref 11.5–15.5)
WBC: 6.3 10*3/uL (ref 4.0–10.5)
nRBC: 0 % (ref 0.0–0.2)

## 2018-09-13 LAB — BASIC METABOLIC PANEL
ANION GAP: 8 (ref 5–15)
BUN: 12 mg/dL (ref 8–23)
CHLORIDE: 102 mmol/L (ref 98–111)
CO2: 28 mmol/L (ref 22–32)
Calcium: 9 mg/dL (ref 8.9–10.3)
Creatinine, Ser: 0.71 mg/dL (ref 0.44–1.00)
GFR calc Af Amer: >60 mL/min (ref >60)
GFR calc non Af Amer: >60 mL/min (ref >60)
Glucose, Bld: 93 mg/dL (ref 70–99)
POTASSIUM: 4.3 mmol/L (ref 3.5–5.1)
SODIUM: 138 mmol/L (ref 135–145)

## 2018-09-13 LAB — SURGICAL PCR SCREEN
MRSA, PCR: NEGATIVE
STAPHYLOCOCCUS AUREUS: NEGATIVE

## 2018-09-13 NOTE — Progress Notes (Signed)
   09/13/18 1151  [place]  Have you ever been diagnosed with sleep apnea through a sleep study? No  Do you snore loudly (loud enough to be heard through closed doors)?  1  Do you often feel tired, fatigued, or sleepy during the daytime (such as falling asleep during driving or talking to someone)? 1  Has anyone observed you stop breathing during your sleep? 1  Do you have, or are you being treated for high blood pressure? 1  BMI more than 35 kg/m2? 0  Age > 50 (1-yes) 1  Neck circumference greater than:Female 16 inches or larger, Female 17inches or larger? 0  Female Gender (Yes=1) 0  Obstructive Sleep Apnea Score 5  Score 5 or greater  Results sent to PCP

## 2018-09-21 DIAGNOSIS — Z01811 Encounter for preprocedural respiratory examination: Secondary | ICD-10-CM | POA: Diagnosis not present

## 2018-09-21 DIAGNOSIS — M1712 Unilateral primary osteoarthritis, left knee: Secondary | ICD-10-CM | POA: Diagnosis not present

## 2018-09-25 NOTE — Progress Notes (Signed)
DR [facility] CLEARANCE10-25-19 AND LOV NOTE 09-21-18 ON CHART

## 2018-09-25 NOTE — Anesthesia Preprocedure Evaluation (Addendum)
Anesthesia Evaluation  Patient identified by MRN, date of birth, ID band Patient awake    Reviewed: Allergy & Precautions, NPO status , Patient's Chart, lab work & pertinent test results  Airway Mallampati: II  TM Distance: >3 FB     Dental   Pulmonary shortness of breath, former smoker,    breath sounds clear to auscultation       Cardiovascular hypertension,  Rhythm:Regular Rate:Normal     Neuro/Psych    GI/Hepatic GERD  ,  Endo/Other    Renal/GU      Musculoskeletal  (+) Arthritis ,   Abdominal   Peds  Hematology   Anesthesia Other Findings   Reproductive/Obstetrics                            Anesthesia Physical Anesthesia Plan  ASA: III  Anesthesia Plan: Spinal   Post-op Pain Management:    Induction: Intravenous  PONV Risk Score and Plan: Ondansetron, Dexamethasone and Midazolam  Airway Management Planned: Simple Face Mask and Nasal Cannula  Additional Equipment:   Intra-op Plan:   Post-operative Plan:   Informed Consent: I have reviewed the patients History and Physical, chart, labs and discussed the procedure including the risks, benefits and alternatives for the proposed anesthesia with the patient or authorized representative who has indicated his/her understanding and acceptance.     Plan Discussed with: Anesthesiologist  Anesthesia Plan Comments:        Anesthesia Quick Evaluation

## 2018-09-26 ENCOUNTER — Other Ambulatory Visit: Payer: Self-pay

## 2018-09-26 ENCOUNTER — Encounter (HOSPITAL_COMMUNITY): Payer: Self-pay | Admitting: *Deleted

## 2018-09-26 ENCOUNTER — Encounter (HOSPITAL_COMMUNITY)
Admission: RE | Disposition: A | Discharge status: Home / Self Care | Payer: Self-pay | Source: Ambulatory Visit | Attending: Orthopedic Surgery

## 2018-09-26 ENCOUNTER — Inpatient Hospital Stay (HOSPITAL_COMMUNITY): Payer: Medicare Other | Admitting: Anesthesiology

## 2018-09-26 ENCOUNTER — Observation Stay (HOSPITAL_COMMUNITY)
Admission: RE | Admit: 2018-09-26 | Discharge: 2018-09-27 | Disposition: A | Discharge status: Home / Self Care | Payer: Medicare Other | Source: Ambulatory Visit | Attending: Orthopedic Surgery | Admitting: Orthopedic Surgery

## 2018-09-26 DIAGNOSIS — Z881 Allergy status to other antibiotic agents status: Secondary | ICD-10-CM | POA: Diagnosis not present

## 2018-09-26 DIAGNOSIS — M1712 Unilateral primary osteoarthritis, left knee: Principal | ICD-10-CM | POA: Insufficient documentation

## 2018-09-26 DIAGNOSIS — Z87891 Personal history of nicotine dependence: Secondary | ICD-10-CM | POA: Diagnosis not present

## 2018-09-26 DIAGNOSIS — E669 Obesity, unspecified: Secondary | ICD-10-CM | POA: Diagnosis present

## 2018-09-26 DIAGNOSIS — I10 Essential (primary) hypertension: Secondary | ICD-10-CM | POA: Insufficient documentation

## 2018-09-26 DIAGNOSIS — Z6831 Body mass index (BMI) 31.0-31.9, adult: Secondary | ICD-10-CM | POA: Diagnosis not present

## 2018-09-26 DIAGNOSIS — Z79899 Other long term (current) drug therapy: Secondary | ICD-10-CM | POA: Insufficient documentation

## 2018-09-26 DIAGNOSIS — Z96659 Presence of unspecified artificial knee joint: Secondary | ICD-10-CM

## 2018-09-26 DIAGNOSIS — Z96652 Presence of left artificial knee joint: Secondary | ICD-10-CM

## 2018-09-26 DIAGNOSIS — G8918 Other acute postprocedural pain: Secondary | ICD-10-CM | POA: Diagnosis not present

## 2018-09-26 DIAGNOSIS — E785 Hyperlipidemia, unspecified: Secondary | ICD-10-CM | POA: Diagnosis not present

## 2018-09-26 DIAGNOSIS — M1711 Unilateral primary osteoarthritis, right knee: Secondary | ICD-10-CM | POA: Diagnosis not present

## 2018-09-26 HISTORY — PX: TOTAL KNEE ARTHROPLASTY: SHX125

## 2018-09-26 LAB — TYPE AND SCREEN
ABO/RH(D): O POS
Antibody Screen: NEGATIVE

## 2018-09-26 LAB — ABO/RH: ABO/RH(D): O POS

## 2018-09-26 SURGERY — ARTHROPLASTY, KNEE, TOTAL
Anesthesia: Spinal | Site: Knee | Laterality: Left

## 2018-09-26 MED ORDER — LIP MEDEX EX OINT
TOPICAL_OINTMENT | CUTANEOUS | Status: AC
  Filled 2018-09-26: qty 7

## 2018-09-26 MED ORDER — SODIUM CHLORIDE (PF) 0.9 % IJ SOLN
INTRAMUSCULAR | Status: AC
  Filled 2018-09-26: qty 50

## 2018-09-26 MED ORDER — PROPOFOL 10 MG/ML IV BOLUS
INTRAVENOUS | Status: AC
  Filled 2018-09-26: qty 60

## 2018-09-26 MED ORDER — FERROUS SULFATE 325 (65 FE) MG PO TABS
325.0000 mg | ORAL_TABLET | Freq: Two times a day (BID) | ORAL | Status: DC
  Administered 2018-09-27: 325 mg via ORAL
  Filled 2018-09-26: qty 1

## 2018-09-26 MED ORDER — SODIUM CHLORIDE (PF) 0.9 % IJ SOLN
INTRAMUSCULAR | Status: DC | PRN
  Administered 2018-09-26: 30 mL

## 2018-09-26 MED ORDER — PROPOFOL 500 MG/50ML IV EMUL
INTRAVENOUS | Status: DC | PRN
  Administered 2018-09-26: 125 ug/kg/min via INTRAVENOUS

## 2018-09-26 MED ORDER — ONDANSETRON HCL 4 MG/2ML IJ SOLN: 4.0000 mg | Freq: Four times a day (QID) | INTRAMUSCULAR | Status: DC | PRN

## 2018-09-26 MED ORDER — FERROUS SULFATE 325 (65 FE) MG PO TABS: 325.0000 mg | ORAL_TABLET | Freq: Three times a day (TID) | ORAL | 3 refills | Status: DC

## 2018-09-26 MED ORDER — POLYETHYLENE GLYCOL 3350 17 G PO PACK
17.0000 g | PACK | Freq: Two times a day (BID) | ORAL | Status: DC
  Administered 2018-09-26 – 2018-09-27 (×2): 17 g via ORAL
  Filled 2018-09-26 (×2): qty 1

## 2018-09-26 MED ORDER — PHENYLEPHRINE 40 MCG/ML (10ML) SYRINGE FOR IV PUSH (FOR BLOOD PRESSURE SUPPORT)
PREFILLED_SYRINGE | INTRAVENOUS | Status: AC
  Filled 2018-09-26: qty 10

## 2018-09-26 MED ORDER — SUCCINYLCHOLINE CHLORIDE 200 MG/10ML IV SOSY
PREFILLED_SYRINGE | INTRAVENOUS | Status: AC
  Filled 2018-09-26: qty 10

## 2018-09-26 MED ORDER — MIDAZOLAM HCL 5 MG/5ML IJ SOLN
INTRAMUSCULAR | Status: DC | PRN
  Administered 2018-09-26 (×2): 2 mg via INTRAVENOUS

## 2018-09-26 MED ORDER — DEXAMETHASONE SODIUM PHOSPHATE 10 MG/ML IJ SOLN
INTRAMUSCULAR | Status: AC
  Filled 2018-09-26: qty 1

## 2018-09-26 MED ORDER — MIDAZOLAM HCL 2 MG/2ML IJ SOLN
INTRAMUSCULAR | Status: AC
  Filled 2018-09-26: qty 2

## 2018-09-26 MED ORDER — ALPRAZOLAM 0.5 MG PO TABS: 0.5000 mg | ORAL_TABLET | ORAL | Status: DC | PRN

## 2018-09-26 MED ORDER — TOBRAMYCIN SULFATE 1.2 G IJ SOLR
INTRAMUSCULAR | Status: AC
  Filled 2018-09-26: qty 1.2

## 2018-09-26 MED ORDER — ONDANSETRON HCL 4 MG PO TABS: 4.0000 mg | ORAL_TABLET | Freq: Four times a day (QID) | ORAL | Status: DC | PRN

## 2018-09-26 MED ORDER — MAGNESIUM CITRATE PO SOLN: 1.0000 | Freq: Once | ORAL | Status: DC | PRN

## 2018-09-26 MED ORDER — BUPIVACAINE-EPINEPHRINE (PF) 0.25% -1:200000 IJ SOLN
INTRAMUSCULAR | Status: AC
  Filled 2018-09-26: qty 30

## 2018-09-26 MED ORDER — SODIUM CHLORIDE 0.9 % IV SOLN
INTRAVENOUS | Status: DC
  Administered 2018-09-26 – 2018-09-27 (×2): via INTRAVENOUS

## 2018-09-26 MED ORDER — HYDROCODONE-ACETAMINOPHEN 7.5-325 MG PO TABS: 1.0000 | ORAL_TABLET | ORAL | 0 refills | Status: DC | PRN

## 2018-09-26 MED ORDER — GLYCERIN-HYPROMELLOSE-PEG 400 0.2-0.2-1 % OP SOLN
1.0000 [drp] | OPHTHALMIC | Status: DC | PRN
  Filled 2018-09-26: qty 15

## 2018-09-26 MED ORDER — PHENYLEPHRINE 40 MCG/ML (10ML) SYRINGE FOR IV PUSH (FOR BLOOD PRESSURE SUPPORT)
PREFILLED_SYRINGE | INTRAVENOUS | Status: DC | PRN
  Administered 2018-09-26: 80 ug via INTRAVENOUS
  Administered 2018-09-26: 120 ug via INTRAVENOUS

## 2018-09-26 MED ORDER — BUPIVACAINE IN DEXTROSE 0.75-8.25 % IT SOLN
INTRATHECAL | Status: DC | PRN
  Administered 2018-09-26: 1.8 mL via INTRATHECAL

## 2018-09-26 MED ORDER — FENTANYL CITRATE (PF) 100 MCG/2ML IJ SOLN
25.0000 ug | INTRAMUSCULAR | Status: DC | PRN
  Administered 2018-09-26 (×2): 50 ug via INTRAVENOUS

## 2018-09-26 MED ORDER — FENTANYL CITRATE (PF) 100 MCG/2ML IJ SOLN
INTRAMUSCULAR | Status: DC | PRN
  Administered 2018-09-26 (×2): 50 ug via INTRAVENOUS

## 2018-09-26 MED ORDER — CEFAZOLIN SODIUM-DEXTROSE 2-4 GM/100ML-% IV SOLN
2.0000 g | Freq: Four times a day (QID) | INTRAVENOUS | Status: AC
  Administered 2018-09-26 (×2): 2 g via INTRAVENOUS
  Filled 2018-09-26 (×2): qty 100

## 2018-09-26 MED ORDER — ACETAMINOPHEN 325 MG PO TABS: 325.0000 mg | ORAL_TABLET | Freq: Four times a day (QID) | ORAL | Status: DC | PRN

## 2018-09-26 MED ORDER — MENTHOL 3 MG MT LOZG: 1.0000 | LOZENGE | OROMUCOSAL | Status: DC | PRN

## 2018-09-26 MED ORDER — ONDANSETRON HCL 4 MG/2ML IJ SOLN
INTRAMUSCULAR | Status: AC
  Filled 2018-09-26: qty 2

## 2018-09-26 MED ORDER — METHOCARBAMOL 500 MG PO TABS
500.0000 mg | ORAL_TABLET | Freq: Four times a day (QID) | ORAL | Status: DC | PRN
  Administered 2018-09-26 – 2018-09-27 (×2): 500 mg via ORAL
  Filled 2018-09-26 (×2): qty 1

## 2018-09-26 MED ORDER — CELECOXIB 200 MG PO CAPS
200.0000 mg | ORAL_CAPSULE | Freq: Two times a day (BID) | ORAL | Status: DC
  Administered 2018-09-26 – 2018-09-27 (×2): 200 mg via ORAL
  Filled 2018-09-26 (×2): qty 1

## 2018-09-26 MED ORDER — CEFAZOLIN SODIUM-DEXTROSE 2-4 GM/100ML-% IV SOLN
2.0000 g | INTRAVENOUS | Status: AC
  Administered 2018-09-26: 2 g via INTRAVENOUS
  Filled 2018-09-26: qty 100

## 2018-09-26 MED ORDER — LIDOCAINE 2% (20 MG/ML) 5 ML SYRINGE
INTRAMUSCULAR | Status: AC
  Filled 2018-09-26: qty 5

## 2018-09-26 MED ORDER — DIPHENHYDRAMINE HCL 12.5 MG/5ML PO ELIX
12.5000 mg | ORAL_SOLUTION | ORAL | Status: DC | PRN
  Administered 2018-09-27: 25 mg via ORAL
  Administered 2018-09-27: 12.5 mg via ORAL
  Filled 2018-09-26: qty 10
  Filled 2018-09-26: qty 5

## 2018-09-26 MED ORDER — POLYVINYL ALCOHOL 1.4 % OP SOLN
1.0000 [drp] | OPHTHALMIC | Status: DC | PRN
  Filled 2018-09-26: qty 15

## 2018-09-26 MED ORDER — VENLAFAXINE HCL ER 75 MG PO CP24
75.0000 mg | ORAL_CAPSULE | Freq: Every day | ORAL | Status: DC
  Administered 2018-09-26 – 2018-09-27 (×2): 75 mg via ORAL
  Filled 2018-09-26 (×2): qty 1

## 2018-09-26 MED ORDER — DEXAMETHASONE SODIUM PHOSPHATE 10 MG/ML IJ SOLN
10.0000 mg | Freq: Once | INTRAMUSCULAR | Status: AC
  Administered 2018-09-26: 10 mg via INTRAVENOUS

## 2018-09-26 MED ORDER — METHOCARBAMOL 500 MG IVPB - SIMPLE MED
INTRAVENOUS | Status: AC
  Administered 2018-09-26: 500 mg
  Filled 2018-09-26: qty 50

## 2018-09-26 MED ORDER — FENTANYL CITRATE (PF) 100 MCG/2ML IJ SOLN
INTRAMUSCULAR | Status: AC
  Filled 2018-09-26: qty 2

## 2018-09-26 MED ORDER — TRANEXAMIC ACID-NACL 1000-0.7 MG/100ML-% IV SOLN
1000.0000 mg | Freq: Once | INTRAVENOUS | Status: AC
  Administered 2018-09-26: 1000 mg via INTRAVENOUS
  Filled 2018-09-26: qty 100

## 2018-09-26 MED ORDER — DOCUSATE SODIUM 100 MG PO CAPS
100.0000 mg | ORAL_CAPSULE | Freq: Two times a day (BID) | ORAL | Status: DC
  Administered 2018-09-26 – 2018-09-27 (×2): 100 mg via ORAL
  Filled 2018-09-26 (×2): qty 1

## 2018-09-26 MED ORDER — FENTANYL CITRATE (PF) 100 MCG/2ML IJ SOLN: 25.0000 ug | INTRAMUSCULAR | Status: DC | PRN

## 2018-09-26 MED ORDER — METOCLOPRAMIDE HCL 5 MG/ML IJ SOLN: 5.0000 mg | Freq: Three times a day (TID) | INTRAMUSCULAR | Status: DC | PRN

## 2018-09-26 MED ORDER — ASPIRIN 81 MG PO CHEW
81.0000 mg | CHEWABLE_TABLET | Freq: Two times a day (BID) | ORAL | Status: DC
  Administered 2018-09-26 – 2018-09-27 (×2): 81 mg via ORAL
  Filled 2018-09-26 (×2): qty 1

## 2018-09-26 MED ORDER — PROPOFOL 10 MG/ML IV BOLUS
INTRAVENOUS | Status: DC | PRN
  Administered 2018-09-26: 40 mg via INTRAVENOUS

## 2018-09-26 MED ORDER — PHENOL 1.4 % MT LIQD
1.0000 | OROMUCOSAL | Status: DC | PRN
  Filled 2018-09-26: qty 177

## 2018-09-26 MED ORDER — ASPIRIN 81 MG PO CHEW: 81.0000 mg | CHEWABLE_TABLET | Freq: Two times a day (BID) | ORAL | 0 refills | Status: AC

## 2018-09-26 MED ORDER — LACTATED RINGERS IV SOLN
INTRAVENOUS | Status: DC
  Administered 2018-09-26 (×2): via INTRAVENOUS

## 2018-09-26 MED ORDER — VANCOMYCIN HCL 1000 MG IV SOLR
INTRAVENOUS | Status: AC
  Filled 2018-09-26: qty 1000

## 2018-09-26 MED ORDER — POLYETHYLENE GLYCOL 3350 17 G PO PACK: 17.0000 g | PACK | Freq: Two times a day (BID) | ORAL | 0 refills | Status: DC

## 2018-09-26 MED ORDER — TRANEXAMIC ACID-NACL 1000-0.7 MG/100ML-% IV SOLN
1000.0000 mg | INTRAVENOUS | Status: AC
  Administered 2018-09-26: 1000 mg via INTRAVENOUS
  Filled 2018-09-26: qty 100

## 2018-09-26 MED ORDER — ONDANSETRON HCL 4 MG/2ML IJ SOLN
INTRAMUSCULAR | Status: DC | PRN
  Administered 2018-09-26: 4 mg via INTRAVENOUS

## 2018-09-26 MED ORDER — BISACODYL 10 MG RE SUPP: 10.0000 mg | Freq: Every day | RECTAL | Status: DC | PRN

## 2018-09-26 MED ORDER — DEXAMETHASONE SODIUM PHOSPHATE 10 MG/ML IJ SOLN
10.0000 mg | Freq: Once | INTRAMUSCULAR | Status: AC
  Administered 2018-09-27: 10 mg via INTRAVENOUS
  Filled 2018-09-26: qty 1

## 2018-09-26 MED ORDER — AMLODIPINE BESYLATE 5 MG PO TABS
5.0000 mg | ORAL_TABLET | Freq: Every day | ORAL | Status: DC
Start: 2018-09-26 — End: 2018-09-27
  Administered 2018-09-26: 5 mg via ORAL
  Filled 2018-09-26 (×2): qty 1

## 2018-09-26 MED ORDER — KETOROLAC TROMETHAMINE 30 MG/ML IJ SOLN
INTRAMUSCULAR | Status: AC
  Filled 2018-09-26: qty 1

## 2018-09-26 MED ORDER — CHLORHEXIDINE GLUCONATE 4 % EX LIQD: 60.0000 mL | Freq: Once | CUTANEOUS | Status: DC

## 2018-09-26 MED ORDER — DOCUSATE SODIUM 100 MG PO CAPS: 100.0000 mg | ORAL_CAPSULE | Freq: Two times a day (BID) | ORAL | 0 refills | Status: DC

## 2018-09-26 MED ORDER — BUPROPION HCL ER (XL) 150 MG PO TB24
150.0000 mg | ORAL_TABLET | Freq: Every day | ORAL | Status: DC
Start: 2018-09-26 — End: 2018-09-27
  Administered 2018-09-26 – 2018-09-27 (×2): 150 mg via ORAL
  Filled 2018-09-26 (×2): qty 1

## 2018-09-26 MED ORDER — HYDROCODONE-ACETAMINOPHEN 7.5-325 MG PO TABS
1.0000 | ORAL_TABLET | ORAL | Status: DC | PRN
  Administered 2018-09-26: 1 via ORAL
  Administered 2018-09-26: 2 via ORAL
  Administered 2018-09-26: 1 via ORAL
  Administered 2018-09-27 (×4): 2 via ORAL
  Filled 2018-09-26: qty 1
  Filled 2018-09-26 (×4): qty 2
  Filled 2018-09-26: qty 1
  Filled 2018-09-26: qty 2

## 2018-09-26 MED ORDER — TRIAMTERENE-HCTZ 37.5-25 MG PO TABS
1.0000 | ORAL_TABLET | Freq: Every day | ORAL | Status: DC
  Administered 2018-09-27: 1 via ORAL
  Filled 2018-09-26: qty 1

## 2018-09-26 MED ORDER — ALUM & MAG HYDROXIDE-SIMETH 200-200-20 MG/5ML PO SUSP: 15.0000 mL | ORAL | Status: DC | PRN

## 2018-09-26 MED ORDER — METOCLOPRAMIDE HCL 5 MG PO TABS: 5.0000 mg | ORAL_TABLET | Freq: Three times a day (TID) | ORAL | Status: DC | PRN

## 2018-09-26 MED ORDER — METHOCARBAMOL 1000 MG/10ML IJ SOLN
500.0000 mg | Freq: Four times a day (QID) | INTRAVENOUS | Status: DC | PRN
  Filled 2018-09-26: qty 5

## 2018-09-26 MED ORDER — METHOCARBAMOL 500 MG PO TABS: 500.0000 mg | ORAL_TABLET | Freq: Four times a day (QID) | ORAL | 0 refills | Status: DC | PRN

## 2018-09-26 MED ORDER — HYDROCODONE-ACETAMINOPHEN 5-325 MG PO TABS
1.0000 | ORAL_TABLET | ORAL | Status: DC | PRN
  Administered 2018-09-26: 1 via ORAL
  Filled 2018-09-26: qty 1

## 2018-09-26 MED ORDER — ESOMEPRAZOLE MAGNESIUM 20 MG PO CPDR
20.0000 mg | DELAYED_RELEASE_CAPSULE | Freq: Every day | ORAL | Status: DC
  Administered 2018-09-27: 20 mg via ORAL
  Filled 2018-09-26 (×2): qty 1

## 2018-09-26 MED ORDER — BUPIVACAINE-EPINEPHRINE (PF) 0.25% -1:200000 IJ SOLN
INTRAMUSCULAR | Status: DC | PRN
  Administered 2018-09-26: 30 mL

## 2018-09-26 MED ORDER — KETOROLAC TROMETHAMINE 30 MG/ML IJ SOLN
INTRAMUSCULAR | Status: DC | PRN
  Administered 2018-09-26: 30 mg via INTRA_ARTICULAR

## 2018-09-26 SURGICAL SUPPLY — 59 items
ADH SKN CLS APL DERMABOND .7 (GAUZE/BANDAGES/DRESSINGS) ×1
ATTUNE MED ANAT PAT 35 KNEE (Knees) ×1 IMPLANT
ATTUNE PS FEM LT SZ 4 CEM KNEE (Femur) ×1 IMPLANT
ATTUNE PSRP INSR SZ4 6 KNEE (Insert) ×1 IMPLANT
BAG SPEC THK2 15X12 ZIP CLS (MISCELLANEOUS)
BAG ZIPLOCK 12X15 (MISCELLANEOUS) IMPLANT
BANDAGE ACE 6X5 VEL STRL LF (GAUZE/BANDAGES/DRESSINGS) ×2 IMPLANT
BANDAGE ELASTIC 6 VELCRO ST LF (GAUZE/BANDAGES/DRESSINGS) ×1 IMPLANT
BASE TIBIAL ROT PLAT SZ 5 KNEE (Knees) IMPLANT
BLADE SAW SGTL 11.0X1.19X90.0M (BLADE) ×1 IMPLANT
BLADE SAW SGTL 13.0X1.19X90.0M (BLADE) ×2 IMPLANT
BLADE SURG SZ10 CARB STEEL (BLADE) ×4 IMPLANT
BOWL SMART MIX CTS (DISPOSABLE) ×2 IMPLANT
BSPLAT TIB 5 CMNT ROT PLAT STR (Knees) ×1 IMPLANT
CEMENT HV SMART SET (Cement) ×2 IMPLANT
COVER SURGICAL LIGHT HANDLE (MISCELLANEOUS) ×2 IMPLANT
COVER WAND RF STERILE (DRAPES) IMPLANT
CUFF TOURN SGL QUICK 34 (TOURNIQUET CUFF) ×2
CUFF TRNQT CYL 34X4X40X1 (TOURNIQUET CUFF) ×1 IMPLANT
DECANTER SPIKE VIAL GLASS SM (MISCELLANEOUS) ×3 IMPLANT
DERMABOND ADVANCED (GAUZE/BANDAGES/DRESSINGS) ×1
DERMABOND ADVANCED .7 DNX12 (GAUZE/BANDAGES/DRESSINGS) ×1 IMPLANT
DRAPE U-SHAPE 47X51 STRL (DRAPES) ×2 IMPLANT
DRESSING AQUACEL AG SP 3.5X10 (GAUZE/BANDAGES/DRESSINGS) ×1 IMPLANT
DRSG AQUACEL AG ADV 3.5X10 (GAUZE/BANDAGES/DRESSINGS) ×1 IMPLANT
DRSG AQUACEL AG SP 3.5X10 (GAUZE/BANDAGES/DRESSINGS) ×2
DURAPREP 26ML APPLICATOR (WOUND CARE) ×4 IMPLANT
ELECT REM PT RETURN 15FT ADLT (MISCELLANEOUS) ×2 IMPLANT
GLOVE BIOGEL M 7.0 STRL (GLOVE) IMPLANT
GLOVE BIOGEL PI IND STRL 7.5 (GLOVE) ×1 IMPLANT
GLOVE BIOGEL PI IND STRL 8.5 (GLOVE) ×1 IMPLANT
GLOVE BIOGEL PI INDICATOR 7.5 (GLOVE) ×1
GLOVE BIOGEL PI INDICATOR 8.5 (GLOVE) ×1
GLOVE ECLIPSE 8.0 STRL XLNG CF (GLOVE) ×2 IMPLANT
GLOVE ORTHO TXT STRL SZ7.5 (GLOVE) ×4 IMPLANT
GOWN STRL REUS W/TWL 2XL LVL3 (GOWN DISPOSABLE) ×2 IMPLANT
GOWN STRL REUS W/TWL LRG LVL3 (GOWN DISPOSABLE) ×2 IMPLANT
HANDPIECE INTERPULSE COAX TIP (DISPOSABLE) ×2
HOLDER FOLEY CATH W/STRAP (MISCELLANEOUS) ×1 IMPLANT
MANIFOLD NEPTUNE II (INSTRUMENTS) ×2 IMPLANT
NDL SAFETY ECLIPSE 18X1.5 (NEEDLE) IMPLANT
NEEDLE HYPO 18GX1.5 SHARP (NEEDLE)
PACK TOTAL KNEE CUSTOM (KITS) ×2 IMPLANT
PIN THREADED HEADED SIGMA (PIN) ×1 IMPLANT
PROTECTOR NERVE ULNAR (MISCELLANEOUS) ×2 IMPLANT
SET HNDPC FAN SPRY TIP SCT (DISPOSABLE) ×1 IMPLANT
SET PAD KNEE POSITIONER (MISCELLANEOUS) ×2 IMPLANT
SUT MNCRL AB 4-0 PS2 18 (SUTURE) ×2 IMPLANT
SUT STRATAFIX PDS+ 0 24IN (SUTURE) ×2 IMPLANT
SUT VIC AB 1 CT1 36 (SUTURE) ×2 IMPLANT
SUT VIC AB 2-0 CT1 27 (SUTURE) ×6
SUT VIC AB 2-0 CT1 TAPERPNT 27 (SUTURE) ×3 IMPLANT
SYRINGE 3CC LL L/F (MISCELLANEOUS) ×2 IMPLANT
TIBIAL BASE ROT PLAT SZ 5 KNEE (Knees) ×2 IMPLANT
TRAY FOLEY CATH 14FRSI W/METER (CATHETERS) ×1 IMPLANT
TRAY FOLEY MTR SLVR 16FR STAT (SET/KITS/TRAYS/PACK) ×1 IMPLANT
WATER STERILE IRR 1000ML POUR (IV SOLUTION) ×3 IMPLANT
WRAP KNEE MAXI GEL POST OP (GAUZE/BANDAGES/DRESSINGS) ×2 IMPLANT
YANKAUER SUCT BULB TIP 10FT TU (MISCELLANEOUS) ×2 IMPLANT

## 2018-09-26 NOTE — Progress Notes (Signed)
IV on left hand infiltrated when attempting IV start and blood draw. Nurse applied pressure and notable bruise started to appear. More pressure applied and hand wrapped with coban.

## 2018-09-26 NOTE — Discharge Instructions (Signed)

## 2018-09-26 NOTE — Transfer of Care (Signed)
Immediate Anesthesia Transfer of Care Note  Patient: Patricia [name]  Procedure(s) Performed: TOTAL KNEE ARTHROPLASTY (Left Knee)  Patient Location: PACU  Anesthesia Type:Spinal  Level of Consciousness: awake, alert  and oriented  Airway & Oxygen Therapy: Patient Spontanous Breathing and Patient connected to face mask oxygen  Post-op Assessment: Report given to RN and Post -op Vital signs reviewed and stable  Post vital signs: Reviewed and stable  Last Vitals:  Vitals Value Taken Time  BP 121/68 09/26/2018  9:21 AM  Temp    Pulse 75 09/26/2018  9:23 AM  Resp 15 09/26/2018  9:23 AM  SpO2 100 % 09/26/2018  9:23 AM  Vitals shown include unvalidated device data.  Last Pain:  Vitals:   09/26/18 0551  TempSrc: Oral         Complications: No apparent anesthesia complications

## 2018-09-26 NOTE — Plan of Care (Signed)
Plan of care 

## 2018-09-26 NOTE — Anesthesia Procedure Notes (Addendum)
Spinal  Start time: 09/26/2018 7:31 AM End time: 09/26/2018 7:40 AM Staffing Resident/CRNA: [place] Stephanie C, CRNA Performed: resident/CRNA  Preanesthetic Checklist Completed: patient identified, site marked, surgical consent, pre-op evaluation, timeout performed, IV checked, risks and benefits discussed and monitors and equipment checked Spinal Block Patient position: sitting Prep: site prepped and draped and DuraPrep Patient monitoring: heart rate, cardiac monitor, continuous pulse ox and blood pressure Approach: midline Location: L3-4 Injection technique: single-shot Needle Needle type: Pencan  Needle gauge: 24 G Assessment Sensory level: T6 Additional Notes Kit expiration and drugs checked, pt tolerated procedure well, good CSF flow before and after injection, no parasthesia

## 2018-09-26 NOTE — Evaluation (Signed)
Physical Therapy Evaluation Patient Details Name: Patricia [name] MRN: [id number] DOB: [birth year] Today's Date: 09/26/2018   History of Present Illness  s/p L TKA  Clinical Impression  Pt is s/p TKA resulting in the deficits listed below (see PT Problem List).  Pt doing well, amb ~ 27' with RW and min assist; pain controlled; encouraged ankle pumps and  Quad sets;  Pt will benefit from skilled PT to increase their independence and safety with mobility to allow discharge to the venue listed below.      Follow Up Recommendations Follow surgeon's recommendation for DC plan and follow-up therapies    Equipment Recommendations  Rolling walker with 5" wheels(supposedly ordered)    Recommendations for Other Services       Precautions / Restrictions Precautions Precautions: Fall;Knee Restrictions Weight Bearing Restrictions: No      Mobility  Bed Mobility Overal bed mobility: Needs Assistance Bed Mobility: Supine to Sit     Supine to sit: Min assist     General bed mobility comments: assist with LLE  Transfers Overall transfer level: Needs assistance Equipment used: Rolling walker (2 wheeled) Transfers: Sit to/from Stand Sit to Stand: Min assist         General transfer comment: light assist to rise and stabilize, cues for hand placement and LLE  Ambulation/Gait Ambulation/Gait assistance: Min guard;Min assist Gait Distance (Feet): 70 Feet Assistive device: Rolling walker (2 wheeled) Gait Pattern/deviations: Step-to pattern     General Gait Details: cues for sequence, RW position and safety with turns  Science writer    Modified Rankin (Stroke Patients Only)       Balance                                             Pertinent Vitals/Pain Pain Assessment: 0-10 Pain Score: 5  Pain Location: L knee Pain Descriptors / Indicators: Discomfort;Grimacing Pain Intervention(s): Limited activity within  patient's tolerance;Monitored during session;Premedicated before session;Repositioned    Home Living Family/patient expects to be discharged to:: Private residence Living Arrangements: Spouse/significant other   Type of Home: House Home Access: Stairs to enter   Technical brewer of Steps: 6 Home Layout: Two level Home Equipment: None      Prior Function Level of Independence: Independent               Hand Dominance        Extremity/Trunk Assessment   Upper Extremity Assessment Upper Extremity Assessment: Overall WFL for tasks assessed    Lower Extremity Assessment Lower Extremity Assessment: LLE deficits/detail LLE Deficits / Details: ankle WFL; knee extension and hip flexion 2+/5        Communication   Communication: No difficulties  Cognition Arousal/Alertness: Awake/alert Behavior During Therapy: WFL for tasks assessed/performed Overall Cognitive Status: Within Functional Limits for tasks assessed                                        General Comments      Exercises Total Joint Exercises Ankle Circles/Pumps: AROM;Both;10 reps Quad Sets: Both;AROM;10 reps   Assessment/Plan    PT Assessment Patient needs continued PT services  PT Problem List Decreased strength;Decreased mobility;Decreased activity tolerance;Pain;Decreased knowledge of use of DME;Decreased range  of motion       PT Treatment Interventions DME instruction;Gait training;Therapeutic activities;Therapeutic exercise;Patient/family education;Functional mobility training;Stair training    PT Goals (Current goals can be found in the Care Plan section)  Acute Rehab PT Goals PT Goal Formulation: With patient Time For Goal Achievement: 10/03/18 Potential to Achieve Goals: Good    Frequency 7X/week   Barriers to discharge        Co-evaluation               AM-PAC PT "6 Clicks" Mobility  Outcome Measure Help needed turning from your back to your side  while in a flat bed without using bedrails?: A Little Help needed moving from lying on your back to sitting on the side of a flat bed without using bedrails?: A Little Help needed moving to and from a bed to a chair (including a wheelchair)?: A Little   Help needed to walk in hospital room?: A Little Help needed climbing 3-5 steps with a railing? : A Little 6 Click Score: 15    End of Session Equipment Utilized During Treatment: Gait belt Activity Tolerance: Patient tolerated treatment well Patient left: with call bell/phone within reach;in chair;with chair alarm set;with family/visitor present   PT Visit Diagnosis: Difficulty in walking, not elsewhere classified (R26.2)    Time: 8677-3736 PT Time Calculation (min) (ACUTE ONLY): 39 min   Charges:   PT Evaluation $PT Eval Low Complexity: 1 Low PT Treatments $Gait Training: 8-22 mins $Therapeutic Activity: 8-22 mins        Kenyon Ana, PT  Pager: [phone] Acute Rehab Dept [facility]): [phone]   09/26/2018   [facility] 09/26/2018, 3:41 PM

## 2018-09-26 NOTE — Interval H&P Note (Signed)
History and Physical Interval Note:  09/26/2018 7:07 AM  Patricia [name]  has presented today for surgery, with the diagnosis of Left knee osteoarthritis  The various methods of treatment have been discussed with the patient and family. After consideration of risks, benefits and other options for treatment, the patient has consented to  Procedure(s) with comments: TOTAL KNEE ARTHROPLASTY (Left) - 70 mins as a surgical intervention .  The patient's history has been reviewed, patient examined, no change in status, stable for surgery.  I have reviewed the patient's chart and labs.  Questions were answered to the patient's satisfaction.     Mauri Pole

## 2018-09-26 NOTE — Op Note (Signed)
NAME:  Patricia [name]                      MEDICAL RECORD NO.:  [id number]                             FACILITY:  [facility]      PHYSICIAN:  Pietro Cassis. Alvan Dame, M.D.  DATE OF BIRTH:  29-Aug-1951      DATE OF PROCEDURE:  09/26/2018                                     OPERATIVE REPORT         PREOPERATIVE DIAGNOSIS:  Left knee osteoarthritis.      POSTOPERATIVE DIAGNOSIS:  Left knee osteoarthritis.      FINDINGS:  The patient was noted to have complete loss of cartilage and   bone-on-bone arthritis with associated osteophytes in the medial and patellofemoral compartments of   the knee with a significant synovitis and associated effusion.  The patient had failed months of conservative treatment including medications, injection therapy, activity modification.     PROCEDURE:  Left total knee replacement.      COMPONENTS USED:  DePuy Attune rotating platform posterior stabilized knee   system, a size 4 femur, 5 tibia, size 6 mm PS AOX insert, and 35 anatomic patellar   button.      SURGEON:  Pietro Cassis. Alvan Dame, M.D.      ASSISTANT:  Danae Orleans, PA-C.      ANESTHESIA:  Regional and Spinal.      SPECIMENS:  None.      COMPLICATION:  None.      DRAINS:  None.  EBL: <100cc      TOURNIQUET TIME:   Total Tourniquet Time Documented: Thigh (Left) - 29 minutes Total: Thigh (Left) - 29 minutes  .      The patient was stable to the recovery room.      INDICATION FOR PROCEDURE:  Patricia [name] is a 67 y.o. female patient of   mine.  The patient had been seen, evaluated, and treated for months conservatively in the   office with medication, activity modification, and injections.  The patient had   radiographic changes of bone-on-bone arthritis with endplate sclerosis and osteophytes noted.  Based on the radiographic changes and failed conservative measures, the patient   decided to proceed with definitive treatment, total knee replacement.  Risks of infection, DVT, component  failure, need for revision surgery, neurovascular injury were reviewed in the office setting.  The postop course was reviewed stressing the efforts to maximize post-operative satisfaction and function.  Consent was obtained for benefit of pain   relief.      PROCEDURE IN DETAIL:  The patient was brought to the operative theater.   Once adequate anesthesia, preoperative antibiotics, 2 gm of Ancef,1 gm of Tranexamic Acid, and 10 mg of Decadron administered, the patient was positioned supine with a left thigh tourniquet placed.  The  left lower extremity was prepped and draped in sterile fashion.  A time-   out was performed identifying the patient, planned procedure, and the appropriate extremity.      The left lower extremity was placed in the [facility] leg holder.  The leg was   exsanguinated, tourniquet elevated to 250 mmHg.  A midline incision was   made  followed by median parapatellar arthrotomy.  Following initial   exposure, attention was first directed to the patella.  Precut   measurement was noted to be 22 mm.  I resected down to 13-14 mm and used a   35 anatomic patellar button to restore patellar height as well as cover the cut surface.      The lug holes were drilled and a metal shim was placed to protect the   patella from retractors and saw blade during the procedure.      At this point, attention was now directed to the femur.  The femoral   canal was opened with a drill, irrigated to try to prevent fat emboli.  An   intramedullary rod was passed at 3 degrees valgus, 9 mm of bone was   resected off the distal femur.  Following this resection, the tibia was   subluxated anteriorly.  Using the extramedullary guide, 2 mm of bone was resected off   the proximal medial tibia.  We confirmed the gap would be   stable medially and laterally with a size 5 spacer block as well as confirmed that the tibial cut was perpendicular in the coronal plane, checking with an alignment rod.      Once  this was done, I sized the femur to be a size 4 in the anterior-   posterior dimension, chose a standard component based on medial and   lateral dimension.  The size 4 rotation block was then pinned in   position anterior referenced using the C-clamp to set rotation.  The   anterior, posterior, and  chamfer cuts were made without difficulty nor   notching making certain that I was along the anterior cortex to help   with flexion gap stability.      The final box cut was made off the lateral aspect of distal femur.      At this point, the tibia was sized to be a size 5.  The size 5 tray was   then pinned in position through the medial third of the tubercle,   drilled, and keel punched.  Trial reduction was now carried with a 4 femur,  5 tibia, a size 6 mm PS insert, and the 35 anatomic patella botton.  The knee was brought to full extension with good flexion stability with the patella   tracking through the trochlea without application of pressure.  Given   all these findings the trial components removed.  Final components were   opened and cement was mixed.  The knee was irrigated with normal saline solution and pulse lavage.  The synovial lining was   then injected with 30 cc of 0.25% Marcaine with epinephrine, 1 cc of Toradol and 30 cc of NS for a total of 61 cc.     Final implants were then cemented onto cleaned and dried cut surfaces of bone with the knee brought to extension with a size 6 mm PS trial insert.      Once the cement had fully cured, excess cement was removed   throughout the knee.  I confirmed that I was satisfied with the range of   motion and stability, and the final size 6 mm PS AOX insert was chosen.  It was   placed into the knee.      The tourniquet had been let down at 29 minutes.  No significant   hemostasis was required.  The extensor mechanism was then reapproximated using #1 Vicryl and #  1 Stratafix sutures with the knee   in flexion.  The   remaining wound  was closed with 2-0 Vicryl and running 4-0 Monocryl.   The knee was cleaned, dried, dressed sterilely using Dermabond and   Aquacel dressing.  The patient was then   brought to recovery room in stable condition, tolerating the procedure   well.   Please note that Physician Assistant, Danae Orleans, PA-C was present for the entirety of the case, and was utilized for pre-operative positioning, peri-operative retractor management, general facilitation of the procedure and for primary wound closure at the end of the case.              Pietro Cassis Alvan Dame, M.D.    09/26/2018 8:50 AM

## 2018-09-26 NOTE — Anesthesia Postprocedure Evaluation (Signed)
Anesthesia Post Note  Patient: Patricia [name]  Procedure(s) Performed: TOTAL KNEE ARTHROPLASTY (Left Knee)     Patient location during evaluation: PACU Anesthesia Type: Spinal Level of consciousness: awake Pain management: pain level controlled Vital Signs Assessment: post-procedure vital signs reviewed and stable Respiratory status: spontaneous breathing Cardiovascular status: stable Postop Assessment: spinal receding Anesthetic complications: no    Last Vitals:  Vitals:   09/26/18 1404 09/26/18 1827  BP: 126/83 115/79  Pulse: (!) 101 93  Resp: 16 16  Temp: (!) 36.4 C 36.5 C  SpO2: 95% 95%    Last Pain:  Vitals:   09/26/18 1827  TempSrc: Oral  PainSc:                  Ron Junco

## 2018-09-26 NOTE — Anesthesia Procedure Notes (Signed)
Anesthesia Regional Block: Adductor canal block   Pre-Anesthetic Checklist: ,, timeout performed, Correct Patient, Correct Site, Correct Laterality, Correct Procedure, Correct Position, site marked, Risks and benefits discussed,  Surgical consent,  Pre-op evaluation,  At surgeon's request and post-op pain management  Laterality: Left  Prep: chloraprep       Needles:  Injection technique: Single-shot  Needle Type: Echogenic Stimulator Needle          Additional Needles:   Procedures: Doppler guided,,,, ultrasound used (permanent image in chart),,,,  Narrative:  Start time: 09/26/2018 6:55 AM End time: 09/26/2018 7:10 AM Injection made incrementally with aspirations every 5 mL.  Performed by: Personally  Anesthesiologist: Belinda Block, MD

## 2018-09-26 NOTE — Care Plan (Signed)
Correction to previous note, entered on incorrect patient.   DCP: Home with spouse.  Lives in a 2 story home with 5 ste.   DME:  RW and 3-in-1 ordered through [place] PT:  [facility].  Eval scheduled on 09-29-18.

## 2018-09-26 NOTE — Care Plan (Signed)
Ortho Bundle Case Management Note  Patient Details  Name: LAURIANA DENES MRN: [id number] Date of Birth: [birth year]                     DME Arranged:  3-N-1, Walker rolling DME Agency:  [employer]  HH Arranged:  NA [place] Agency:  NA  Additional Comments: Please contact me with any questions of if this plan should need to change.  Marianne Sofia, RN,CCM [facility]  [phone] 09/26/2018, 9:51 AM

## 2018-09-27 ENCOUNTER — Encounter (HOSPITAL_COMMUNITY): Payer: Self-pay | Admitting: Orthopedic Surgery

## 2018-09-27 DIAGNOSIS — Z87891 Personal history of nicotine dependence: Secondary | ICD-10-CM | POA: Diagnosis not present

## 2018-09-27 DIAGNOSIS — E669 Obesity, unspecified: Secondary | ICD-10-CM | POA: Diagnosis not present

## 2018-09-27 DIAGNOSIS — I10 Essential (primary) hypertension: Secondary | ICD-10-CM | POA: Diagnosis not present

## 2018-09-27 DIAGNOSIS — M1712 Unilateral primary osteoarthritis, left knee: Secondary | ICD-10-CM | POA: Diagnosis not present

## 2018-09-27 DIAGNOSIS — Z881 Allergy status to other antibiotic agents status: Secondary | ICD-10-CM | POA: Diagnosis not present

## 2018-09-27 DIAGNOSIS — Z79899 Other long term (current) drug therapy: Secondary | ICD-10-CM | POA: Diagnosis not present

## 2018-09-27 LAB — CBC
HCT: 34.5 % — ABNORMAL LOW (ref 36.0–46.0)
Hemoglobin: 11 g/dL — ABNORMAL LOW (ref 12.0–15.0)
MCH: 30.6 pg (ref 26.0–34.0)
MCHC: 31.9 g/dL (ref 30.0–36.0)
MCV: 96.1 fL (ref 80.0–100.0)
Platelets: 188 10*3/uL (ref 150–400)
RBC: 3.59 MIL/uL — ABNORMAL LOW (ref 3.87–5.11)
RDW: 12.7 % (ref 11.5–15.5)
WBC: 9.9 10*3/uL (ref 4.0–10.5)
nRBC: 0 % (ref 0.0–0.2)

## 2018-09-27 LAB — BASIC METABOLIC PANEL
Anion gap: 7 (ref 5–15)
BUN: 8 mg/dL (ref 8–23)
CO2: 26 mmol/L (ref 22–32)
Calcium: 8.2 mg/dL — ABNORMAL LOW (ref 8.9–10.3)
Chloride: 109 mmol/L (ref 98–111)
Creatinine, Ser: 0.6 mg/dL (ref 0.44–1.00)
GFR calc Af Amer: >60 mL/min (ref >60)
GFR calc non Af Amer: >60 mL/min (ref >60)
GLUCOSE: 138 mg/dL — AB (ref 70–99)
Potassium: 3.8 mmol/L (ref 3.5–5.1)
Sodium: 142 mmol/L (ref 135–145)

## 2018-09-27 NOTE — Care Management Obs Status (Signed)
[place] NOTIFICATION   Patient Details  Name: Patricia [name] MRN: [id number] Date of Birth: 12/02/1950   Medicare Observation Status Notification Given:  Yes    Guadalupe Maple, RN 09/27/2018, 2:04 PM

## 2018-09-27 NOTE — Progress Notes (Signed)
Pt and pt's husband were provided with d/c instructions and prescriptions. After discussing the pt's plan of care upon d/c home, the pt and pt's husband reported no further questions or concerns.

## 2018-09-27 NOTE — Progress Notes (Signed)
Physical Therapy Treatment Patient Details Name: Patricia [name] MRN: [id number] DOB: Feb 08, 1951 Today's Date: 09/27/2018    History of Present Illness s/p L TKA    PT Comments    Attempted to see pt earlier and she was having lunch; reviewed HEP, stairs, gait and overall safety with pt and husband; ready for d/c from PT standpoint  Follow Up Recommendations  Follow surgeon's recommendation for DC plan and follow-up therapies     Equipment Recommendations  Rolling walker with 5" wheels    Recommendations for Other Services       Precautions / Restrictions Precautions Precautions: Fall;Knee Restrictions Weight Bearing Restrictions: No    Mobility  Bed Mobility               General bed mobility comments: in chair  Transfers Overall transfer level: Needs assistance Equipment used: Rolling walker (2 wheeled) Transfers: Sit to/from Stand Sit to Stand: Supervision         General transfer comment: cues for hand placement  Ambulation/Gait Ambulation/Gait assistance: Supervision;Min guard Gait Distance (Feet): 80 Feet Assistive device: Rolling walker (2 wheeled) Gait Pattern/deviations: Step-to pattern     General Gait Details: cues for sequence, RW position and safety with turns   Stairs Stairs: Yes Stairs assistance: Min guard;Min assist Stair Management: No rails;One rail Right;One rail Left;Step to pattern;Sideways;Backwards;With walker Number of Stairs: 5 General stair comments: cues for technique and sequence; up/down iwth one rail adn up/down with RW posteriorly(husband present for session)   Wheelchair Mobility    Modified Rankin (Stroke Patients Only)       Balance                                            Cognition Arousal/Alertness: Awake/alert Behavior During Therapy: WFL for tasks assessed/performed Overall Cognitive Status: Within Functional Limits for tasks assessed                                         Exercises Total Joint Exercises Ankle Circles/Pumps: AROM;Both;10 reps Quad Sets: Both;AROM;10 reps Short Arc Quad: AROM;Left;10 reps Heel Slides: AAROM;Left;10 reps Hip ABduction/ADduction: AROM;Left;10 reps Straight Leg Raises: AROM;Left;10 reps Goniometric ROM: grossly 5* to 60* AAROM left knee    General Comments        Pertinent Vitals/Pain Pain Assessment: 0-10 Pain Score: 4  Pain Location: L knee Pain Descriptors / Indicators: Discomfort;Grimacing Pain Intervention(s): Limited activity within patient's tolerance;Monitored during session;Premedicated before session;Repositioned;Ice applied    Home Living                      Prior Function            PT Goals (current goals can now be found in the care plan section) Acute Rehab PT Goals PT Goal Formulation: With patient Time For Goal Achievement: 10/03/18 Potential to Achieve Goals: Good Progress towards PT goals: Progressing toward goals    Frequency    7X/week      PT Plan Current plan remains appropriate    Co-evaluation              AM-PAC PT "6 Clicks" Mobility   Outcome Measure  Help needed turning from your back to your side while in a flat bed without using bedrails?: A  Little Help needed moving from lying on your back to sitting on the side of a flat bed without using bedrails?: A Little Help needed moving to and from a bed to a chair (including a wheelchair)?: A Little Help needed standing up from a chair using your arms (e.g., wheelchair or bedside chair)?: A Little Help needed to walk in hospital room?: A Little Help needed climbing 3-5 steps with a railing? : A Little 6 Click Score: 18    End of Session Equipment Utilized During Treatment: Gait belt Activity Tolerance: Patient tolerated treatment well Patient left: with call bell/phone within reach;in chair;with chair alarm set;with family/visitor present   PT Visit Diagnosis: Difficulty in walking,  not elsewhere classified (R26.2)     Time: 1330-1402 PT Time Calculation (min) (ACUTE ONLY): 32 min  Charges:  $Gait Training: 8-22 mins $Therapeutic Exercise: 8-22 mins                     Kenyon Ana, PT  Pager: [phone] Acute Rehab Dept [facility]): [phone]   09/27/2018    [facility] 09/27/2018, 2:11 PM

## 2018-09-27 NOTE — Progress Notes (Signed)
     Subjective: 1 Day Post-Op Procedure(s) (LRB): TOTAL KNEE ARTHROPLASTY (Left)   Patient reports pain as mild, pain controlled.  No events throughout the night.  Discussed the use of antibiotics in the post-op setting with regards to her rash, discussed with Dr. Alvan Dame prior to surgery.  Feels that she is doing well and ready to be discharged home.   Patient's anticipated LOS is less than 2 midnights, meeting these requirements: - Lives within 1 hour of care - Has a competent adult at home to recover with post-op recover - NO history of  - Chronic pain requiring opiods  - Diabetes  - Heart failure  - Heart attack  - Stroke  - DVT/VTE  - Respiratory Failure/COPD  - Renal failure  - Anemia  - Advanced Liver disease    Objective:   VITALS:   Vitals:   09/27/18 0828 09/27/18 0922  BP: 103/75 109/70  Pulse: 70 74  Resp:  16  Temp:  97.9 F (36.6 C)  SpO2:  96%    Dorsiflexion/Plantar flexion intact Incision: dressing C/D/I No cellulitis present Compartment soft  LABS Recent Labs    09/27/18 0422  HGB 11.0*  HCT 34.5*  WBC 9.9  PLT 188    Recent Labs    09/27/18 0422  NA 142  K 3.8  BUN 8  CREATININE 0.60  GLUCOSE 138*     Assessment/Plan: 1 Day Post-Op Procedure(s) (LRB): TOTAL KNEE ARTHROPLASTY (Left) Foley cath d/c'ed Advance diet Up with therapy D/C IV fluids Discharge home Follow up in 2 weeks at [facility] Follow up with OLIN,Malissa Slay D in 2 weeks.  Contact information:  [facility]) [address], [place] [phone]    Obese (BMI 30-39.9) Estimated body mass index is 31.03 kg/m as calculated from the following:   Height as of this encounter: 5' 4"  (1.626 m).   Weight as of this encounter: 82 kg. Patient also counseled that weight may inhibit the healing process Patient counseled that losing weight will help with future health  issues      [place] P[place]. Santhosh Gulino   PAC  09/27/2018, 9:26 AM

## 2018-09-27 NOTE — Discharge Summary (Signed)
Physician Discharge Summary  Patient ID: Patricia [name] MRN: [id number] DOB/AGE: 68-Sep-1952 68 y.o.  Admit date: 09/26/2018 Discharge date: 09/27/2018   Procedures:  Procedure(s) (LRB): TOTAL KNEE ARTHROPLASTY (Left)  Attending Physician:  Dr. Paralee Cancel   Admission Diagnoses:   Left knee primary OA / pain  Discharge Diagnoses:  Principal Problem:   S/P left TKA Active Problems:   Obesity, unspecified  Past Medical History:  Diagnosis Date  . Aneurysm of aorta (HCC)   . Arthritis   . Atrophic vaginitis   . Depression   . Diverticulitis   . Edema of both legs    She takes Lasix a couple of times per week to decrease swelling.    . Fatty liver   . Hyperlipidemia   . Hypertension   . Keratosis    hands and lower limbs  . Lymphedema    legs  . Memory difficulty   . Obesity   . Reflux   . Shingles   . Venous stasis    lower legs    HPI:    Patricia [name], 67 y.o. female, has a history of pain and functional disability in the left knee due to arthritis and has failed non-surgical conservative treatments for greater than 12 weeks to include NSAID's and/or analgesics, corticosteriod injections, viscosupplementation injections and activity modification.  Onset of symptoms was gradual, starting >10 years ago with gradually worsening course since that time. The patient noted prior procedures on the knee to include  arthroscopy and menisectomy on the left knee(s).  Patient currently rates pain in the left knee(s) at 7 out of 10 with activity. Patient has night pain, worsening of pain with activity and weight bearing, pain that interferes with activities of daily living, pain with passive range of motion, crepitus and joint swelling.  Patient has evidence of periarticular osteophytes and joint space narrowing by imaging studies.  There is no active infection.  Risks, benefits and expectations were discussed with the patient.  Risks including but not limited to the risk  of anesthesia, blood clots, nerve damage, blood vessel damage, failure of the prosthesis, infection and up to and including death.  Patient understand the risks, benefits and expectations and wishes to proceed with surgery.   PCP: [name], MD   Discharged Condition: good  Hospital Course:  Patient underwent the above stated procedure on 09/26/2018. Patient tolerated the procedure well and brought to the recovery room in good condition and subsequently to the floor.  POD #1 BP: 109/70 ; Pulse: 74 ; Temp: 97.9 F (36.6 C) ; Resp: 16 Patient reports pain as mild, pain controlled.  No events throughout the night.  Discussed the use of antibiotics in the post-op setting with regards to her rash, discussed with Dr. Alvan Dame prior to surgery.  Feels that she is doing well and ready to be discharged home. Dorsiflexion/plantar flexion intact, incision: dressing C/D/I, no cellulitis present and compartment soft.   LABS  Basename    HGB     11.0  HCT     34.5    Discharge Exam: General appearance: alert, cooperative and no distress Extremities: Homans sign is negative, no sign of DVT, no edema, redness or tenderness in the calves or thighs and no ulcers, gangrene or trophic changes  Disposition: Home with follow up in 2 weeks   Follow-up Information    [facility]. Go on 09/29/2018.   Why:  You are scheduled for a physical therapy appointment on 09-29-18 at  11:00 am with Daleen Snook Herbst at [facility]. Contact information: [address] [place], [place] [address] [phone]       Danae Orleans, PA-C. Go on 10/12/2018.   Specialty:  Orthopedic Surgery Why:  You are scheduled for a post-operative appointment with Adrian Prince, PA for Dr. Alvan Dame on 10-12-18 at 4:00 am. Contact information: [address] [place] [address] [place] [address] [phone]           Discharge Instructions    Call MD / Call 911   Complete by:  As directed    If you  experience chest pain or shortness of breath, CALL 911 and be transported to the hospital emergency room.  If you develope a fever above 101 F, pus (white drainage) or increased drainage or redness at the wound, or calf pain, call your surgeon's office.   Change dressing   Complete by:  As directed    Maintain surgical dressing until follow up in the clinic. If the edges start to pull up, may reinforce with tape. If the dressing is no longer working, may remove and cover with gauze and tape, but must keep the area dry and clean.  Call with any questions or concerns.   Constipation Prevention   Complete by:  As directed    Drink plenty of fluids.  Prune juice may be helpful.  You may use a stool softener, such as Colace (over the counter) 100 mg twice a day.  Use MiraLax (over the counter) for constipation as needed.   Diet - low sodium heart healthy   Complete by:  As directed    Discharge instructions   Complete by:  As directed    Maintain surgical dressing until follow up in the clinic. If the edges start to pull up, may reinforce with tape. If the dressing is no longer working, may remove and cover with gauze and tape, but must keep the area dry and clean.  Follow up in 2 weeks at [facility]. Call with any questions or concerns.   Increase activity slowly as tolerated   Complete by:  As directed    Weight bearing as tolerated with assist device (walker, cane, etc) as directed, use it as long as suggested by your surgeon or therapist, typically at least 4-6 weeks.   TED hose   Complete by:  As directed    Use stockings (TED hose) for 2 weeks on both leg(s).  You may remove them at night for sleeping.      Allergies as of 09/27/2018      Reactions   Clindamycin/lincomycin Other (See Comments)   Stomach pain   Morphine    REACTION: itch   Tetracyclines & Related Other (See Comments)   Stomach pain      Medication List    TAKE these medications   ALPRAZolam 0.5 MG  tablet Commonly known as:  XANAX Take 0.5 mg by mouth as needed for anxiety. For travel   amLODipine 5 MG tablet Commonly known as:  NORVASC Take 5 mg by mouth daily.   aspirin 81 MG chewable tablet Chew 1 tablet (81 mg total) by mouth 2 (two) times daily. Take for 4 weeks, then resume regular dose.   buPROPion 150 MG 24 hr tablet Commonly known as:  WELLBUTRIN XL Take 150 mg by mouth daily.   cephALEXin 500 MG capsule Commonly known as:  KEFLEX Take 500 mg by mouth 3 (three) times daily.   desvenlafaxine 50 MG 24 hr tablet  Commonly known as:  PRISTIQ Take 50 mg by mouth daily.   docusate sodium 100 MG capsule Commonly known as:  COLACE Take 1 capsule (100 mg total) by mouth 2 (two) times daily.   esomeprazole 20 MG capsule Commonly known as:  NEXIUM Take 20 mg by mouth daily.   ferrous sulfate 325 (65 FE) MG tablet Take 1 tablet (325 mg total) by mouth 3 (three) times daily with meals.   HYDROcodone-acetaminophen 7.5-325 MG tablet Commonly known as:  NORCO Take 1-2 tablets by mouth every 4 (four) hours as needed for moderate pain.   HYDROcodone-acetaminophen 7.5-325 MG tablet Commonly known as:  NORCO Take 1-2 tablets by mouth every 4 (four) hours as needed for moderate pain. Start taking on:  10/01/2018   methocarbamol 500 MG tablet Commonly known as:  ROBAXIN Take 1 tablet (500 mg total) by mouth every 6 (six) hours as needed for muscle spasms.   polyethylene glycol packet Commonly known as:  MIRALAX / GLYCOLAX Take 17 g by mouth 2 (two) times daily. What changed:  when to take this   triamterene-hydrochlorothiazide 37.5-25 MG tablet Commonly known as:  MAXZIDE-25 Take 1 tablet by mouth daily.            Discharge Care Instructions  (From admission, onward)         Start     Ordered   09/27/18 0000  Change dressing    Comments:  Maintain surgical dressing until follow up in the clinic. If the edges start to pull up, may reinforce with tape. If  the dressing is no longer working, may remove and cover with gauze and tape, but must keep the area dry and clean.  Call with any questions or concerns.   09/27/18 0932           Signed: [place] P[place]. Sharley Keeler   PA-C  09/27/2018, 3:20 PM

## 2018-09-29 DIAGNOSIS — M25562 Pain in left knee: Secondary | ICD-10-CM | POA: Diagnosis not present

## 2018-10-04 DIAGNOSIS — M25562 Pain in left knee: Secondary | ICD-10-CM | POA: Diagnosis not present

## 2018-10-06 DIAGNOSIS — M25562 Pain in left knee: Secondary | ICD-10-CM | POA: Diagnosis not present

## 2018-10-09 DIAGNOSIS — M25562 Pain in left knee: Secondary | ICD-10-CM | POA: Diagnosis not present

## 2018-10-13 DIAGNOSIS — M25562 Pain in left knee: Secondary | ICD-10-CM | POA: Diagnosis not present

## 2018-10-16 DIAGNOSIS — M25562 Pain in left knee: Secondary | ICD-10-CM | POA: Diagnosis not present

## 2018-10-19 DIAGNOSIS — M25562 Pain in left knee: Secondary | ICD-10-CM | POA: Diagnosis not present

## 2018-10-23 DIAGNOSIS — M25562 Pain in left knee: Secondary | ICD-10-CM | POA: Diagnosis not present

## 2018-10-24 DIAGNOSIS — N952 Postmenopausal atrophic vaginitis: Secondary | ICD-10-CM | POA: Diagnosis not present

## 2018-10-24 DIAGNOSIS — E669 Obesity, unspecified: Secondary | ICD-10-CM | POA: Diagnosis not present

## 2018-10-24 DIAGNOSIS — K219 Gastro-esophageal reflux disease without esophagitis: Secondary | ICD-10-CM | POA: Diagnosis not present

## 2018-10-24 DIAGNOSIS — Z23 Encounter for immunization: Secondary | ICD-10-CM | POA: Diagnosis not present

## 2018-10-24 DIAGNOSIS — F322 Major depressive disorder, single episode, severe without psychotic features: Secondary | ICD-10-CM | POA: Diagnosis not present

## 2018-10-24 DIAGNOSIS — I712 Thoracic aortic aneurysm, without rupture: Secondary | ICD-10-CM | POA: Diagnosis not present

## 2018-10-24 DIAGNOSIS — Z0000 Encounter for general adult medical examination without abnormal findings: Secondary | ICD-10-CM | POA: Diagnosis not present

## 2018-10-24 DIAGNOSIS — I868 Varicose veins of other specified sites: Secondary | ICD-10-CM | POA: Diagnosis not present

## 2018-10-24 DIAGNOSIS — I10 Essential (primary) hypertension: Secondary | ICD-10-CM | POA: Diagnosis not present

## 2018-10-24 DIAGNOSIS — E782 Mixed hyperlipidemia: Secondary | ICD-10-CM | POA: Diagnosis not present

## 2018-10-24 DIAGNOSIS — K5730 Diverticulosis of large intestine without perforation or abscess without bleeding: Secondary | ICD-10-CM | POA: Diagnosis not present

## 2018-10-24 DIAGNOSIS — K7581 Nonalcoholic steatohepatitis (NASH): Secondary | ICD-10-CM | POA: Diagnosis not present

## 2018-10-25 DIAGNOSIS — M25562 Pain in left knee: Secondary | ICD-10-CM | POA: Diagnosis not present

## 2018-10-27 DIAGNOSIS — Z471 Aftercare following joint replacement surgery: Secondary | ICD-10-CM | POA: Diagnosis not present

## 2018-10-27 DIAGNOSIS — Z96652 Presence of left artificial knee joint: Secondary | ICD-10-CM | POA: Diagnosis not present

## 2018-10-31 DIAGNOSIS — M25562 Pain in left knee: Secondary | ICD-10-CM | POA: Diagnosis not present

## 2018-11-02 DIAGNOSIS — M25562 Pain in left knee: Secondary | ICD-10-CM | POA: Diagnosis not present

## 2018-11-07 DIAGNOSIS — M25562 Pain in left knee: Secondary | ICD-10-CM | POA: Diagnosis not present

## 2018-11-09 DIAGNOSIS — M25562 Pain in left knee: Secondary | ICD-10-CM | POA: Diagnosis not present

## 2018-11-16 DIAGNOSIS — Z471 Aftercare following joint replacement surgery: Secondary | ICD-10-CM | POA: Diagnosis not present

## 2018-11-16 DIAGNOSIS — Z96652 Presence of left artificial knee joint: Secondary | ICD-10-CM | POA: Diagnosis not present

## 2018-12-01 DIAGNOSIS — Z85828 Personal history of other malignant neoplasm of skin: Secondary | ICD-10-CM | POA: Diagnosis not present

## 2018-12-01 DIAGNOSIS — L433 Subacute (active) lichen planus: Secondary | ICD-10-CM | POA: Diagnosis not present

## 2018-12-01 DIAGNOSIS — L308 Other specified dermatitis: Secondary | ICD-10-CM | POA: Diagnosis not present

## 2018-12-01 DIAGNOSIS — L821 Other seborrheic keratosis: Secondary | ICD-10-CM | POA: Diagnosis not present

## 2018-12-01 DIAGNOSIS — L820 Inflamed seborrheic keratosis: Secondary | ICD-10-CM | POA: Diagnosis not present

## 2018-12-08 DIAGNOSIS — L814 Other melanin hyperpigmentation: Secondary | ICD-10-CM | POA: Diagnosis not present

## 2018-12-08 DIAGNOSIS — L820 Inflamed seborrheic keratosis: Secondary | ICD-10-CM | POA: Diagnosis not present

## 2018-12-08 DIAGNOSIS — Z85828 Personal history of other malignant neoplasm of skin: Secondary | ICD-10-CM | POA: Diagnosis not present

## 2018-12-08 DIAGNOSIS — L433 Subacute (active) lichen planus: Secondary | ICD-10-CM | POA: Diagnosis not present

## 2018-12-13 DIAGNOSIS — J019 Acute sinusitis, unspecified: Secondary | ICD-10-CM | POA: Diagnosis not present

## 2019-01-05 ENCOUNTER — Other Ambulatory Visit: Payer: Self-pay

## 2019-01-05 DIAGNOSIS — R6889 Other general symptoms and signs: Secondary | ICD-10-CM

## 2019-01-05 DIAGNOSIS — R05 Cough: Secondary | ICD-10-CM | POA: Diagnosis not present

## 2019-01-12 LAB — NOVEL CORONAVIRUS, NAA: SARS-CoV-2, NAA: NOT DETECTED

## 2019-01-17 DIAGNOSIS — Z85828 Personal history of other malignant neoplasm of skin: Secondary | ICD-10-CM | POA: Diagnosis not present

## 2019-01-17 DIAGNOSIS — L570 Actinic keratosis: Secondary | ICD-10-CM | POA: Diagnosis not present

## 2019-01-17 DIAGNOSIS — L433 Subacute (active) lichen planus: Secondary | ICD-10-CM | POA: Diagnosis not present

## 2019-01-17 DIAGNOSIS — L820 Inflamed seborrheic keratosis: Secondary | ICD-10-CM | POA: Diagnosis not present

## 2019-02-14 DIAGNOSIS — M25562 Pain in left knee: Secondary | ICD-10-CM | POA: Diagnosis not present

## 2019-02-14 DIAGNOSIS — M76892 Other specified enthesopathies of left lower limb, excluding foot: Secondary | ICD-10-CM | POA: Diagnosis not present

## 2019-02-14 DIAGNOSIS — M1711 Unilateral primary osteoarthritis, right knee: Secondary | ICD-10-CM | POA: Diagnosis not present

## 2019-02-15 ENCOUNTER — Telehealth: Payer: Self-pay | Admitting: Cardiology

## 2019-02-15 DIAGNOSIS — I10 Essential (primary) hypertension: Secondary | ICD-10-CM | POA: Diagnosis not present

## 2019-02-15 DIAGNOSIS — R51 Headache: Secondary | ICD-10-CM | POA: Diagnosis not present

## 2019-02-15 MED ORDER — HYDRALAZINE HCL 25 MG PO TABS: ORAL_TABLET | ORAL | 3 refills | Status: DC

## 2019-02-15 NOTE — Telephone Encounter (Signed)
Pt has had headache since last night that kept her from sleeping. Reports her face being very red. Her BP this morning was 175/106. She took Tylenol at 5am and 8am and 1pm. She retook her BP a few minutes ago and it has come down to 943 systolic. She has been taking all her medications as prescribed.   Pt received cortisone injections in both knees by her ortho doc yesterday.   Pt is wondering if she should continue taking Tylenol or if there is another medication she can take instead?   Will route to A Duke for advisement.

## 2019-02-15 NOTE — Telephone Encounter (Signed)
  Patient is c/o a headache last night around 8pm and had it all night and bad heartburn. This morning her BP is 175/106 then went down to 164/99 and she still has a headache. She would like some advice on what to do.

## 2019-02-15 NOTE — Telephone Encounter (Signed)
Message received from Triage. I returned call to patient.  She took statin last night for the first time in 2 months. She also received a cortisone shot in her knee yesterday at ortho. Her face is red and hot, she has had a persistent headache, and elevated blood pressure. She is compliant on maxide and amlodipine. Her pressure this afternoon was 154/94, down from 175/106. Her pulse ox (home divide) was normal). Her headache is resolving with xanax and tylenol.   She denies chest pain, back pain, and SOB. We discussed when to call the office back and when to go to the ER. She does have a known aortic aneurysm. Last office note reviewed by Dr. Marlou Porch.   I prescribed 25 mg hydralazine BID to start, with the ability to take a third tablet at 2pm for persistently elevated BP. She is very savvy and will monitor her BP correctly.   I would like for her to have a telehealth visit early next week with Dr. Marlou Porch or an APP on his team. She has a smartphone and is amenable to a teleheatlh visit. I have sent instructions to her MyChart account.   I will route this note to Dr. Marlou Porch.  Triage - please make telehealth VIDEO appt with Dr. Marlou Porch or an APP early next week.   Tami Lin Duke, PA-C 02/15/2019, 2:52 PM

## 2019-02-15 NOTE — Telephone Encounter (Signed)
Ok with telehealth visit next week. [name], MD

## 2019-02-19 ENCOUNTER — Telehealth (INDEPENDENT_AMBULATORY_CARE_PROVIDER_SITE_OTHER): Payer: Medicare Other | Admitting: Cardiology

## 2019-02-19 ENCOUNTER — Other Ambulatory Visit: Payer: Self-pay

## 2019-02-19 ENCOUNTER — Encounter: Payer: Self-pay | Admitting: Cardiology

## 2019-02-19 VITALS — BP 138/95 | HR 113 | Ht 64.0 in | Wt 186.0 lb

## 2019-02-19 DIAGNOSIS — I77810 Thoracic aortic ectasia: Secondary | ICD-10-CM

## 2019-02-19 DIAGNOSIS — R002 Palpitations: Secondary | ICD-10-CM

## 2019-02-19 DIAGNOSIS — Z01812 Encounter for preprocedural laboratory examination: Secondary | ICD-10-CM

## 2019-02-19 DIAGNOSIS — I10 Essential (primary) hypertension: Secondary | ICD-10-CM

## 2019-02-19 MED ORDER — METOPROLOL SUCCINATE ER 50 MG PO TB24: 50.0000 mg | ORAL_TABLET | Freq: Every day | ORAL | 3 refills | Status: DC

## 2019-02-19 NOTE — Patient Instructions (Addendum)
Medication Instructions:  Please discontinue your Hydralazine and start Toprol XL (metoprolol succinate) 50 mg daily. Continue all other medications as listed.  If you need a refill on your cardiac medications before your next appointment, please call your pharmacy.   Lab work: Please have blood work in approximately 1 month before your CT scan. If you have labs (blood work) drawn today and your tests are completely normal, you will receive your results only by: Marland Kitchen MyChart Message (if you have MyChart) OR . A paper copy in the mail If you have any lab test that is abnormal or we need to change your treatment, we will call you to review the results.  Testing/Procedures: Your physician has requested that you have cardiac CT in approximately 1 month.  You will be contacted to schedule this testing to be completed at our [facility] office. Cardiac computed tomography (CT) is a painless test that uses an x-ray machine to take clear, detailed pictures of your heart.   Follow-Up: Follow up with Dr Marlou Porch in 1 week as scheduled.  Thank you for choosing [facility]!!
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
## 2019-02-20 ENCOUNTER — Telehealth: Payer: Self-pay | Admitting: *Deleted

## 2019-02-20 NOTE — Telephone Encounter (Signed)
Follow up ° ° °Patient is returning your call. Please call. ° ° ° °

## 2019-02-20 NOTE — Telephone Encounter (Signed)
Left message to call back.  Pt is scheduled with Dr Marlou Porch 5/12.  She is presently scheduled for a virtual appointment however Dr Marlou Porch is DOD this day and will be in the office seeing patients.  Need to see if patient can come into the office to be seen or if she prefers to reschedule to another day when he is not in the office.

## 2019-02-20 NOTE — Telephone Encounter (Signed)
Spoke with patient who is aware her appt will be in the office with Dr Marlou Porch 5/12.  She is agreeable to this.

## 2019-02-27 ENCOUNTER — Ambulatory Visit (INDEPENDENT_AMBULATORY_CARE_PROVIDER_SITE_OTHER): Payer: Medicare Other | Admitting: Cardiology

## 2019-02-27 ENCOUNTER — Other Ambulatory Visit: Payer: Self-pay

## 2019-02-27 ENCOUNTER — Encounter: Payer: Self-pay | Admitting: Cardiology

## 2019-02-27 VITALS — BP 120/80 | HR 57 | Ht 64.0 in | Wt 187.4 lb

## 2019-02-27 DIAGNOSIS — I77810 Thoracic aortic ectasia: Secondary | ICD-10-CM | POA: Diagnosis not present

## 2019-02-27 DIAGNOSIS — I10 Essential (primary) hypertension: Secondary | ICD-10-CM

## 2019-02-27 DIAGNOSIS — R002 Palpitations: Secondary | ICD-10-CM | POA: Diagnosis not present

## 2019-02-27 DIAGNOSIS — E782 Mixed hyperlipidemia: Secondary | ICD-10-CM

## 2019-02-27 NOTE — Patient Instructions (Signed)
Medication Instructions:  The current medical regimen is effective;  continue present plan and medications.  If you need a refill on your cardiac medications before your next appointment, please call your pharmacy.   Follow-Up: At [facility], you and your health needs are our priority.  As part of our continuing mission to provide you with exceptional heart care, we have created designated Provider Care Teams.  These Care Teams include your primary Cardiologist (physician) and Advanced Practice Providers (APPs -  Physician Assistants and Nurse Practitioners) who all work together to provide you with the care you need, when you need it. You will need a follow up appointment in 6 months with Cecilie Kicks, NP and 12 months with Dr Marlou Porch.  Please call our office 2 months in advance to schedule this appointment.  You may see [name], MD or one of the following Advanced Practice Providers on your designated Care Team:   Truitt Merle, NP Cecilie Kicks, NP . Kathyrn Drown, NP  Thank you for choosing [facility]!!

## 2019-02-27 NOTE — Progress Notes (Signed)
Cardiology Office Note:    Date:  02/27/2019   ID:  Patricia [name], DOB [birth year], MRN [id number]  PCP:  [name], MD  Cardiologist:  [name], MD  Electrophysiologist:  None   Referring MD: [name], MD   No chief complaint on file. Here for the follow-up of hypertension, palpitations  History of Present Illness:    Patricia [name] is a 68 y.o. female with hypertension here for follow-up.  We recently started Toprol-XL 50 mg once a day because she was having both palpitations as well as increased blood pressure.  She did state that she had a cortisone shot a day or 2 prior to her elevated blood pressures.  Certainly this could have been a contributing factor.  Currently she is doing well without any significant palpitations no fevers chills nausea vomiting syncope bleeding.  Aortic root 4.5 cm to 4.3 cm, stable.  Plan on rechecking CT scan soon.  This has been ordered.    Past Medical History:  Diagnosis Date  . Aneurysm of aorta (HCC)   . Arthritis   . Atrophic vaginitis   . Depression   . Diverticulitis   . Edema of both legs    She takes Lasix a couple of times per week to decrease swelling.    . Fatty liver   . Hyperlipidemia   . Hypertension   . Keratosis    hands and lower limbs  . Lymphedema    legs  . Memory difficulty   . Obesity   . Reflux   . Shingles   . Venous stasis    lower legs    Past Surgical History:  Procedure Laterality Date  . ABDOMINAL HYSTERECTOMY    . AUGMENTATION MAMMAPLASTY    . Basal and Squamous cell cancers excised    . CESAREAN SECTION     X 2  . EYE SURGERY     bilateral cataract with lens implants  . KNEE SURGERY    . lipoma surgery    . LIPOSUCTION     knees  . TOTAL KNEE ARTHROPLASTY Left 09/26/2018   Procedure: TOTAL KNEE ARTHROPLASTY;  Surgeon: Paralee Cancel, MD;  Location: [facility];  Service: Orthopedics;  Laterality: Left;  70 mins  . tummy tuck      Current Medications: Current Meds   Medication Sig  . ALPRAZolam (XANAX) 0.5 MG tablet Take 0.5 mg by mouth as needed for anxiety. For travel  . amLODipine (NORVASC) 5 MG tablet Take 5 mg by mouth daily.  . betamethasone dipropionate (DIPROLENE) 0.05 % cream Apply 1 application topically 2 (two) times daily.  Marland Kitchen buPROPion (WELLBUTRIN XL) 150 MG 24 hr tablet Take 150 mg by mouth daily.  Marland Kitchen desvenlafaxine (PRISTIQ) 50 MG 24 hr tablet Take 50 mg by mouth daily.  Marland Kitchen esomeprazole (NEXIUM) 20 MG capsule Take 20 mg by mouth daily.   . ferrous sulfate (FERROUSUL) 325 (65 FE) MG tablet Take 1 tablet (325 mg total) by mouth 3 (three) times daily with meals.  . metoprolol succinate (TOPROL-XL) 50 MG 24 hr tablet Take 1 tablet (50 mg total) by mouth daily. Take with or immediately following a meal.  . polyethylene glycol (MIRALAX / GLYCOLAX) packet Take 17 g by mouth 2 (two) times daily.  Marland Kitchen triamterene-hydrochlorothiazide (MAXZIDE-25) 37.5-25 MG per tablet Take 1 tablet by mouth daily.       Allergies:   Ciprofloxacin; Clindamycin/lincomycin; Morphine; and Tetracyclines & related   Social History   Socioeconomic History  .  Marital status: Married    Spouse name: [name]  . Number of children: 2  . Years of education: 53  . Highest education level: Not on file  Occupational History  . Occupation: Music therapist: [employer]    Comment: SELF-EMPLOYED   Social Needs  . Financial resource strain: Not on file  . Food insecurity:    Worry: Not on file    Inability: Not on file  . Transportation needs:    Medical: Not on file    Non-medical: Not on file  Tobacco Use  . Smoking status: Former Smoker    Packs/day: 1.00    Years: 25.00    Pack years: 25.00  . Smokeless tobacco: Never Used  Substance and Sexual Activity  . Alcohol use: Yes    Alcohol/week: 0.0 standard drinks    Comment: Rare  . Drug use: No  . Sexual activity: Yes    Birth control/protection: Surgical    Comment: HYST-1st intercourse 68  yo-Fewer than 5 partners  Lifestyle  . Physical activity:    Days per week: Not on file    Minutes per session: Not on file  . Stress: Not on file  Relationships  . Social connections:    Talks on phone: Not on file    Gets together: Not on file    Attends religious service: Not on file    Active member of club or organization: Not on file    Attends meetings of clubs or organizations: Not on file    Relationship status: Not on file  Other Topics Concern  . Not on file  Social History Narrative   Marital Status: Married Engineer, technical sales)   Children:  Sons (2)    Pets: Cat    Living Situation: Lives with husband    Occupation: Therapist, occupational - [employer]   Education: Programmer, systems    Tobacco Use/Exposure:  None    Alcohol Use:  Occasional   Drug Use:  None   Diet:  Regular   Exercise:  Bikram Yoga    Hobbies: Gardening/ Reading   Patient is right handed                 Family History: The patient's family history includes Alzheimer's disease in her father; Bipolar disorder in her sister; Breast cancer in her cousin; Cancer in her paternal grandmother; Depression in her sister; Diabetes in her mother; Diverticulitis in her maternal grandmother, paternal grandfather, and paternal grandmother; Heart disease in her mother; Hyperlipidemia in her mother; Hypertension in her father and mother; Liver cancer in her paternal grandfather; Uterine cancer in her mother.  ROS:   Please see the history of present illness.     All other systems reviewed and are negative.  EKGs/Labs/Other Studies Reviewed:    The following studies were reviewed today: Prior echocardiogram and CT of aorta  EKG:  EKG is  ordered today.  The ekg ordered today demonstrates sinus bradycardia 57 with no other significant abnormalities personally reviewed and interpreted  Recent Labs: 05/07/2018: ALT 20 09/27/2018: BUN 8; Creatinine, Ser 0.60; Hemoglobin 11.0; Platelets 188; Potassium  3.8; Sodium 142  Recent Lipid Panel No results found for: CHOL, TRIG, HDL, CHOLHDL, VLDL, LDLCALC, LDLDIRECT  Physical Exam:    VS:  BP 120/80   Pulse (!) 57   Ht 5' 4"  (1.626 m)   Wt 187 lb 6.4 oz (85 kg)   BMI 32.17 kg/m  Wt Readings from Last 3 Encounters:  02/27/19 187 lb 6.4 oz (85 kg)  02/19/19 186 lb (84.4 kg)  09/26/18 180 lb 12.4 oz (82 kg)     GEN:  Well nourished, well developed in no acute distress HEENT: Normal NECK: No JVD; No carotid bruits LYMPHATICS: No lymphadenopathy CARDIAC: RRR, no murmurs, rubs, gallops RESPIRATORY:  Clear to auscultation without rales, wheezing or rhonchi  ABDOMEN: Soft, non-tender, non-distended MUSCULOSKELETAL: Minor dependent edema; No deformity  SKIN: Warm and dry NEUROLOGIC:  Alert and oriented x 3 PSYCHIATRIC:  Normal affect   ASSESSMENT:    1. Dilated aortic root (HCC)   2. Essential hypertension   3. Mixed hyperlipidemia   4. Palpitations    PLAN:    In order of problems listed above:  Dilated aortic root -4.5 to 4.3 cm.  Checking CT scan soon.  Avoiding Cipro  Essential hypertension - Stop hydralazine 25 recently. -Started on Toprol-XL 50 once a day.  Amlodipine 5 Maxide triamterene hydrochlorothiazide. -Toprol was added because of palpitations as well.  If we need to, can add losartan later.  Doing very well with decreased palpitations.  EKG today looks great with sinus bradycardia 57 no other abnormalities.  Dependent edema -Continue with conservative measures.  Has been to [facility].  Cat bite in the past.  Hyperlipidemia -LDL 185 off of meds.  Not interested in medication.  Encouraged heart healthy diet.   Medication Adjustments/Labs and Tests Ordered: Current medicines are reviewed at length with the patient today.  Concerns regarding medicines are outlined above.  Orders Placed This Encounter  Procedures  . EKG 12-Lead   No orders of the defined types were placed in this encounter.    Patient Instructions  Medication Instructions:  The current medical regimen is effective;  continue present plan and medications.  If you need a refill on your cardiac medications before your next appointment, please call your pharmacy.   Follow-Up: At [facility], you and your health needs are our priority.  As part of our continuing mission to provide you with exceptional heart care, we have created designated Provider Care Teams.  These Care Teams include your primary Cardiologist (physician) and Advanced Practice Providers (APPs -  Physician Assistants and Nurse Practitioners) who all work together to provide you with the care you need, when you need it. You will need a follow up appointment in 6 months with Cecilie Kicks, NP and 12 months with Dr Marlou Porch.  Please call our office 2 months in advance to schedule this appointment.  You may see [name], MD or one of the following Advanced Practice Providers on your designated Care Team:   Truitt Merle, NP Cecilie Kicks, NP . Kathyrn Drown, NP  Thank you for choosing [facility]!!        Signed, [name], MD  02/27/2019 12:23 PM    [place]

## 2019-03-05 DIAGNOSIS — R1012 Left upper quadrant pain: Secondary | ICD-10-CM | POA: Diagnosis not present

## 2019-03-14 ENCOUNTER — Other Ambulatory Visit: Payer: Medicare Other | Admitting: *Deleted

## 2019-03-14 ENCOUNTER — Other Ambulatory Visit: Payer: Self-pay

## 2019-03-14 DIAGNOSIS — Z01812 Encounter for preprocedural laboratory examination: Secondary | ICD-10-CM | POA: Diagnosis not present

## 2019-03-14 DIAGNOSIS — I77810 Thoracic aortic ectasia: Secondary | ICD-10-CM | POA: Diagnosis not present

## 2019-03-14 DIAGNOSIS — I10 Essential (primary) hypertension: Secondary | ICD-10-CM | POA: Diagnosis not present

## 2019-03-16 LAB — BASIC METABOLIC PANEL
BUN/Creatinine Ratio: 23 (ref 12–28)
BUN: 18 mg/dL (ref 8–27)
CO2: 25 mmol/L (ref 20–29)
Calcium: 9.4 mg/dL (ref 8.7–10.3)
Chloride: 98 mmol/L (ref 96–106)
Creatinine, Ser: 0.77 mg/dL (ref 0.57–1.00)
GFR calc Af Amer: 92 mL/min/{1.73_m2} (ref >59)
GFR calc non Af Amer: 80 mL/min/{1.73_m2} (ref >59)
Glucose: 104 mg/dL — ABNORMAL HIGH (ref 65–99)
Potassium: 4.2 mmol/L (ref 3.5–5.2)
Sodium: 138 mmol/L (ref 134–144)

## 2019-03-20 ENCOUNTER — Telehealth: Payer: Self-pay | Admitting: *Deleted

## 2019-03-20 NOTE — Telephone Encounter (Signed)

## 2019-03-21 ENCOUNTER — Other Ambulatory Visit: Payer: Self-pay

## 2019-03-21 ENCOUNTER — Ambulatory Visit (INDEPENDENT_AMBULATORY_CARE_PROVIDER_SITE_OTHER)
Admission: RE | Admit: 2019-03-21 | Discharge: 2019-03-21 | Disposition: A | Discharge status: Home / Self Care | Payer: Medicare Other | Source: Ambulatory Visit | Attending: Cardiology | Admitting: Cardiology

## 2019-03-21 DIAGNOSIS — I77810 Thoracic aortic ectasia: Secondary | ICD-10-CM

## 2019-03-21 DIAGNOSIS — I10 Essential (primary) hypertension: Secondary | ICD-10-CM

## 2019-03-21 DIAGNOSIS — I712 Thoracic aortic aneurysm, without rupture: Secondary | ICD-10-CM | POA: Diagnosis not present

## 2019-03-21 MED ORDER — IOHEXOL 350 MG/ML SOLN
100.0000 mL | Freq: Once | INTRAVENOUS | Status: AC | PRN
  Administered 2019-03-21: 100 mL via INTRAVENOUS

## 2019-03-26 ENCOUNTER — Telehealth: Payer: Self-pay

## 2019-03-26 DIAGNOSIS — R002 Palpitations: Secondary | ICD-10-CM

## 2019-03-26 DIAGNOSIS — I77810 Thoracic aortic ectasia: Secondary | ICD-10-CM

## 2019-03-26 NOTE — Telephone Encounter (Signed)
Pt verbalized understanding and results sent to My Chart for her further review.

## 2019-03-26 NOTE — Telephone Encounter (Signed)
-----   Message from Jerline Pain, MD sent at 03/22/2019 12:46 PM EDT ----- Stable aortic root 40m (has ranged from 43 to 45 mm).  Since echocardiogram and CT seem to correlate fairly well, repeat echocardiogram in 1 year to monitor dilated aortic root. MCandee Furbish MD

## 2019-03-29 DIAGNOSIS — M79644 Pain in right finger(s): Secondary | ICD-10-CM | POA: Diagnosis not present

## 2019-03-29 DIAGNOSIS — M25542 Pain in joints of left hand: Secondary | ICD-10-CM | POA: Insufficient documentation

## 2019-03-29 DIAGNOSIS — M13841 Other specified arthritis, right hand: Secondary | ICD-10-CM | POA: Diagnosis not present

## 2019-03-29 DIAGNOSIS — M79645 Pain in left finger(s): Secondary | ICD-10-CM | POA: Diagnosis not present

## 2019-03-29 DIAGNOSIS — M13842 Other specified arthritis, left hand: Secondary | ICD-10-CM | POA: Diagnosis not present

## 2019-03-29 DIAGNOSIS — M25541 Pain in joints of right hand: Secondary | ICD-10-CM | POA: Insufficient documentation

## 2019-04-02 DIAGNOSIS — H524 Presbyopia: Secondary | ICD-10-CM | POA: Diagnosis not present

## 2019-04-02 DIAGNOSIS — H04123 Dry eye syndrome of bilateral lacrimal glands: Secondary | ICD-10-CM | POA: Diagnosis not present

## 2019-04-02 DIAGNOSIS — Z961 Presence of intraocular lens: Secondary | ICD-10-CM | POA: Diagnosis not present

## 2019-04-02 DIAGNOSIS — H5213 Myopia, bilateral: Secondary | ICD-10-CM | POA: Diagnosis not present

## 2019-04-08 DIAGNOSIS — H1032 Unspecified acute conjunctivitis, left eye: Secondary | ICD-10-CM | POA: Diagnosis not present

## 2019-04-16 DIAGNOSIS — Z1231 Encounter for screening mammogram for malignant neoplasm of breast: Secondary | ICD-10-CM | POA: Diagnosis not present

## 2019-04-16 DIAGNOSIS — Z803 Family history of malignant neoplasm of breast: Secondary | ICD-10-CM | POA: Diagnosis not present

## 2019-04-18 DIAGNOSIS — L821 Other seborrheic keratosis: Secondary | ICD-10-CM | POA: Diagnosis not present

## 2019-04-18 DIAGNOSIS — C44529 Squamous cell carcinoma of skin of other part of trunk: Secondary | ICD-10-CM | POA: Diagnosis not present

## 2019-04-18 DIAGNOSIS — L433 Subacute (active) lichen planus: Secondary | ICD-10-CM | POA: Diagnosis not present

## 2019-04-18 DIAGNOSIS — Z85828 Personal history of other malignant neoplasm of skin: Secondary | ICD-10-CM | POA: Diagnosis not present

## 2019-04-18 DIAGNOSIS — D485 Neoplasm of uncertain behavior of skin: Secondary | ICD-10-CM | POA: Diagnosis not present

## 2019-04-18 DIAGNOSIS — L820 Inflamed seborrheic keratosis: Secondary | ICD-10-CM | POA: Diagnosis not present

## 2019-04-23 DIAGNOSIS — N6002 Solitary cyst of left breast: Secondary | ICD-10-CM | POA: Diagnosis not present

## 2019-04-24 DIAGNOSIS — E782 Mixed hyperlipidemia: Secondary | ICD-10-CM | POA: Diagnosis not present

## 2019-04-24 DIAGNOSIS — I10 Essential (primary) hypertension: Secondary | ICD-10-CM | POA: Diagnosis not present

## 2019-04-25 DIAGNOSIS — F322 Major depressive disorder, single episode, severe without psychotic features: Secondary | ICD-10-CM | POA: Diagnosis not present

## 2019-04-25 DIAGNOSIS — R05 Cough: Secondary | ICD-10-CM | POA: Diagnosis not present

## 2019-04-25 DIAGNOSIS — L439 Lichen planus, unspecified: Secondary | ICD-10-CM | POA: Diagnosis not present

## 2019-04-25 DIAGNOSIS — E782 Mixed hyperlipidemia: Secondary | ICD-10-CM | POA: Diagnosis not present

## 2019-04-25 DIAGNOSIS — Z7189 Other specified counseling: Secondary | ICD-10-CM | POA: Diagnosis not present

## 2019-04-25 DIAGNOSIS — I10 Essential (primary) hypertension: Secondary | ICD-10-CM | POA: Diagnosis not present

## 2019-05-14 DIAGNOSIS — L439 Lichen planus, unspecified: Secondary | ICD-10-CM | POA: Diagnosis not present

## 2019-05-15 ENCOUNTER — Ambulatory Visit (INDEPENDENT_AMBULATORY_CARE_PROVIDER_SITE_OTHER): Payer: Medicare Other | Admitting: Sports Medicine

## 2019-05-15 ENCOUNTER — Encounter: Payer: Self-pay | Admitting: Sports Medicine

## 2019-05-15 ENCOUNTER — Other Ambulatory Visit: Payer: Self-pay

## 2019-05-15 VITALS — BP 126/78 | HR 83 | Temp 97.3°F | Resp 16

## 2019-05-15 DIAGNOSIS — I868 Varicose veins of other specified sites: Secondary | ICD-10-CM | POA: Insufficient documentation

## 2019-05-15 DIAGNOSIS — B351 Tinea unguium: Secondary | ICD-10-CM | POA: Diagnosis not present

## 2019-05-15 DIAGNOSIS — M21619 Bunion of unspecified foot: Secondary | ICD-10-CM | POA: Insufficient documentation

## 2019-05-15 DIAGNOSIS — J309 Allergic rhinitis, unspecified: Secondary | ICD-10-CM | POA: Insufficient documentation

## 2019-05-15 DIAGNOSIS — M79675 Pain in left toe(s): Secondary | ICD-10-CM | POA: Diagnosis not present

## 2019-05-15 DIAGNOSIS — K5730 Diverticulosis of large intestine without perforation or abscess without bleeding: Secondary | ICD-10-CM | POA: Insufficient documentation

## 2019-05-15 DIAGNOSIS — L601 Onycholysis: Secondary | ICD-10-CM | POA: Diagnosis not present

## 2019-05-15 DIAGNOSIS — L603 Nail dystrophy: Secondary | ICD-10-CM | POA: Diagnosis not present

## 2019-05-15 DIAGNOSIS — K7581 Nonalcoholic steatohepatitis (NASH): Secondary | ICD-10-CM | POA: Insufficient documentation

## 2019-05-15 DIAGNOSIS — R413 Other amnesia: Secondary | ICD-10-CM | POA: Insufficient documentation

## 2019-05-15 NOTE — Patient Instructions (Signed)
Bunion  A bunion is a bump on the base of the big toe that forms when the bones of the big toe joint move out of position. Bunions may be small at first, but they often get larger over time. They can make walking painful. What are the causes? A bunion may be caused by:  Wearing narrow or pointed shoes that force the big toe to press against the other toes.  Abnormal foot development that causes the foot to roll inward (pronate).  Changes in the foot that are caused by certain diseases, such as rheumatoid arthritis or polio.  A foot injury. What increases the risk? The following factors may make you more likely to develop this condition:  Wearing shoes that squeeze the toes together.  Having certain diseases, such as: ? Rheumatoid arthritis. ? Polio. ? Cerebral palsy.  Having family members who have bunions.  Being born with a foot deformity, such as flat feet or low arches.  Doing activities that put a lot of pressure on the feet, such as ballet dancing. What are the signs or symptoms? The main symptom of a bunion is a noticeable bump on the big toe. Other symptoms may include:  Pain.  Swelling around the big toe.  Redness and inflammation.  Thick or hardened skin on the big toe or between the toes.  Stiffness or loss of motion in the big toe.  Trouble with walking. How is this diagnosed? A bunion may be diagnosed based on your symptoms, medical history, and activities. You may have tests, such as:  X-rays. These allow your health care provider to check the position of the bones in your foot and look for damage to your joint. They also help your health care provider determine the severity of your bunion and the best way to treat it.  Joint aspiration. In this test, a sample of fluid is removed from the toe joint. This test may be done if you are in a lot of pain. It helps rule out diseases that cause painful swelling of the joints, such as arthritis. How is this  treated? Treatment depends on the severity of your symptoms. The goal of treatment is to relieve symptoms and prevent the bunion from getting worse. Your health care provider may recommend:  Wearing shoes that have a wide toe box.  Using bunion pads to cushion the affected area.  Taping your toes together to keep them in a normal position.  Placing a device inside your shoe (orthotics) to help reduce pressure on your toe joint.  Taking medicine to ease pain, inflammation, and swelling.  Applying heat or ice to the affected area.  Doing stretching exercises.  Surgery to remove scar tissue and move the toes back into their normal position. This treatment is rare. Follow these instructions at home: Managing pain, stiffness, and swelling   If directed, put ice on the painful area: ? Put ice in a plastic bag. ? Place a towel between your skin and the bag. ? Leave the ice on for 20 minutes, 2-3 times a day. Activity   If directed, apply heat to the affected area before you exercise. Use the heat source that your health care provider recommends, such as a moist heat pack or a heating pad. ? Place a towel between your skin and the heat source. ? Leave the heat on for 20-30 minutes. ? Remove the heat if your skin turns bright red. This is especially important if you are unable to feel pain,   heat, or cold. You may have a greater risk of getting burned.  Do exercises as told by your health care provider. General instructions  Support your toe joint with proper footwear, shoe padding, or taping as told by your health care provider.  Take over-the-counter and prescription medicines only as told by your health care provider.  Keep all follow-up visits as told by your health care provider. This is important. Contact a health care provider if your symptoms:  Get worse.  Do not improve in 2 weeks. Get help right away if you have:  Severe pain and trouble with walking. Summary  A  bunion is a bump on the base of the big toe that forms when the bones of the big toe joint move out of position.  Bunions can make walking painful.  Treatment depends on the severity of your symptoms.  Support your toe joint with proper footwear, shoe padding, or taping as told by your health care provider. This information is not intended to replace advice given to you by your health care provider. Make sure you discuss any questions you have with your health care provider. Document Released: 10/04/2005 Document Revised: 04/10/2018 Document Reviewed: 02/14/2018 Elsevier Patient Education  2020 Elsevier Inc.  

## 2019-05-15 NOTE — Progress Notes (Signed)
Subjective: Patricia [name] is a 68 y.o. female patient seen today in office with complaint of mildly painful thickened and discolored nail left 1st toenail that had blood under it in March that has grown out but the remaining nails appears to be lifted up. Patient is desiring treatment for nail changes; has not tried OTC topicals/Medication. Reports that nails are becoming difficult to manage because of the thickness at the left 1st toe and is concerned about the lifting of the nail. Patient has no other pedal complaints at this time.   Review of Systems  Skin:       Nail change  All other systems reviewed and are negative.    Patient Active Problem List   Diagnosis Date Noted  . Allergic rhinitis 05/15/2019  . Amnesia 05/15/2019  . Bunion 05/15/2019  . Colon, diverticulosis 05/15/2019  . NASH (nonalcoholic steatohepatitis) 05/15/2019  . Varicose veins of other specified sites 05/15/2019  . Pain in thumb joint with movement of left hand 03/29/2019  . Pain in thumb joint with movement, right 03/29/2019  . S/P left TKA 09/26/2018  . Osteoarthritis of left knee 09/06/2018  . Fatigue 08/07/2018  . Need for influenza vaccination 08/07/2018  . Venous stasis syndrome 07/18/2018  . Lymphedema of right lower extremity 05/29/2018  . Cellulitis 05/07/2018  . Cellulitis and abscess of right leg 05/07/2018  . Pain in left knee 12/09/2017  . Lipoma 10/29/2014  . Encounter for consultation 11/06/2013  . Essential hypertension, benign 02/26/2013  . Obesity, unspecified 02/26/2013  . Aneurysm of aorta (HCC)   . Depression   . Fatty liver   . Reflux   . Atrophic vaginitis   . DYSPNEA 06/04/2009  . HYPERLIPIDEMIA 06/03/2009  . HYPERTENSION 06/03/2009  . MITRAL VALVE PROLAPSE 06/03/2009  . Esophageal reflux 06/03/2009    Current Outpatient Medications on File Prior to Visit  Medication Sig Dispense Refill  . ALPRAZolam (XANAX) 0.5 MG tablet Take 0.5 mg by mouth as needed for anxiety.  For travel    . amLODipine (NORVASC) 5 MG tablet Take 5 mg by mouth daily.    . benzonatate (TESSALON) 100 MG capsule TAKE 1 CAPSULE BY MOUTH THREE TIMES A DAY AS NEEDED FOR COUGH    . betamethasone dipropionate (DIPROLENE) 0.05 % cream Apply 1 application topically 2 (two) times daily.    . clobetasol cream (TEMOVATE) 0.05 % Apply to thick lesions on legs daily as needed.  Never to face/groin.    Marland Kitchen desvenlafaxine (PRISTIQ) 50 MG 24 hr tablet Take 50 mg by mouth daily.    Marland Kitchen esomeprazole (NEXIUM) 20 MG capsule Take 20 mg by mouth daily.     . fluticasone (FLONASE) 50 MCG/ACT nasal spray SPRAY 1 SPRAY INTO EACH NOSTRIL EVERY DAY    . metoprolol succinate (TOPROL-XL) 50 MG 24 hr tablet Take 1 tablet (50 mg total) by mouth daily. Take with or immediately following a meal. 90 tablet 3  . polyethylene glycol (MIRALAX / GLYCOLAX) packet Take 17 g by mouth 2 (two) times daily. 14 each 0  . simvastatin (ZOCOR) 20 MG tablet Take by mouth.    . triamterene-hydrochlorothiazide (MAXZIDE-25) 37.5-25 MG per tablet Take 1 tablet by mouth daily.       No current facility-administered medications on file prior to visit.     Allergies  Allergen Reactions  . Ciprofloxacin Other (See Comments)    Pt has a descending heart aneurism and has been instructed not to take Cipro.   . Clindamycin/Lincomycin  Other (See Comments)    Stomach pain  . Morphine     REACTION: itch  . Tetracyclines & Related Other (See Comments)    Stomach pain    Objective: Physical Exam  General: Well developed, nourished, no acute distress, awake, alert and oriented x 3  Vascular: Dorsalis pedis artery 2/4 bilateral, Posterior tibial artery 1/4 bilateral, skin temperature warm to warm proximal to distal bilateral lower extremities, no varicosities, pedal hair present bilateral.  Neurological: Gross sensation present via light touch bilateral.   Dermatological: Skin is warm, dry, and supple bilateral, Left 1st toenail is short  thick, and discolored with mild subungal debris, no webspace macerations present bilateral, no open lesions present bilateral, no callus/corns/hyperkeratotic tissue present bilateral. No signs of infection bilateral.  Musculoskeletal: + Bunion deformities noted bilateral. Muscular strength within normal limits without painon range of motion. No pain with calf compression bilateral.  Assessment and Plan:  Problem List Items Addressed This Visit      Musculoskeletal and Integument   Bunion    Other Visit Diagnoses    Nail dystrophy    -  Primary   Relevant Orders   Culture, fungus without smear   Toe pain, left          -Examined patient -Discussed treatment options for painful dystrophic nails  -Left 1st toe Fungal culture was obtained by removing a portion of the hard nail itself from each of the involved toenails using a sterile nail nipper and sent to [facility] lab. Patient tolerated the biopsy procedure well without discomfort or need for anesthesia.  -Dispensed silicone bunion sleeves bilateral.  -Patient to return in 4 weeks for follow up evaluation and discussion of fungal culture results or sooner if symptoms worsen.  Landis Martins, DPM

## 2019-05-25 ENCOUNTER — Other Ambulatory Visit: Payer: Self-pay | Admitting: Family Medicine

## 2019-05-25 ENCOUNTER — Ambulatory Visit
Admission: RE | Admit: 2019-05-25 | Discharge: 2019-05-25 | Disposition: A | Discharge status: Home / Self Care | Payer: Medicare Other | Source: Ambulatory Visit | Attending: Family Medicine | Admitting: Family Medicine

## 2019-05-25 DIAGNOSIS — R059 Cough, unspecified: Secondary | ICD-10-CM

## 2019-05-25 DIAGNOSIS — R05 Cough: Secondary | ICD-10-CM

## 2019-06-08 ENCOUNTER — Other Ambulatory Visit: Payer: Self-pay | Admitting: Cardiology

## 2019-06-21 ENCOUNTER — Telehealth: Payer: Self-pay | Admitting: Sports Medicine

## 2019-06-21 NOTE — Telephone Encounter (Signed)
Pt requested a phone call for the results from the culture test. Had an appointment scheduled for Tuesday 09/08 but canceled saying it was clearing up.

## 2019-06-21 NOTE — Telephone Encounter (Signed)
Left message requesting a call to discuss results.

## 2019-06-21 NOTE — Telephone Encounter (Signed)
Called but no answer. Will you let patient know that her fungal culture is +. On the microscopic part of the exam it shows that she has fungus. Her options are: 1. PO Lamisil 2. Laser or 3. Topical nail lacquer -Dr. Cannon Kettle

## 2019-06-22 DIAGNOSIS — Z23 Encounter for immunization: Secondary | ICD-10-CM | POA: Diagnosis not present

## 2019-06-26 ENCOUNTER — Ambulatory Visit: Payer: Medicare Other | Admitting: Sports Medicine

## 2019-06-26 MED ORDER — NONFORMULARY OR COMPOUNDED ITEM: 11 refills | Status: DC

## 2019-06-26 NOTE — Telephone Encounter (Addendum)
I informed pt of Dr. Leeanne Rio review of results and treatment options. Pt states she would like to use the topical and I explained that [facility] would call with coverage and delivery information. Faxed orders to [employer].

## 2019-06-26 NOTE — Addendum Note (Signed)
Addended by: Harriett Sine D on: 06/26/2019 10:28 AM   Modules accepted: Orders

## 2019-06-29 DIAGNOSIS — B078 Other viral warts: Secondary | ICD-10-CM | POA: Diagnosis not present

## 2019-06-29 DIAGNOSIS — L820 Inflamed seborrheic keratosis: Secondary | ICD-10-CM | POA: Diagnosis not present

## 2019-06-29 DIAGNOSIS — Z85828 Personal history of other malignant neoplasm of skin: Secondary | ICD-10-CM | POA: Diagnosis not present

## 2019-07-17 DIAGNOSIS — L570 Actinic keratosis: Secondary | ICD-10-CM | POA: Diagnosis not present

## 2019-07-17 DIAGNOSIS — L439 Lichen planus, unspecified: Secondary | ICD-10-CM | POA: Diagnosis not present

## 2019-07-17 DIAGNOSIS — L821 Other seborrheic keratosis: Secondary | ICD-10-CM | POA: Diagnosis not present

## 2019-07-20 DIAGNOSIS — L570 Actinic keratosis: Secondary | ICD-10-CM | POA: Diagnosis not present

## 2019-07-25 ENCOUNTER — Encounter: Payer: Self-pay | Admitting: Gynecology

## 2019-07-31 DIAGNOSIS — L570 Actinic keratosis: Secondary | ICD-10-CM | POA: Diagnosis not present

## 2019-08-01 DIAGNOSIS — M25561 Pain in right knee: Secondary | ICD-10-CM | POA: Diagnosis not present

## 2019-08-01 DIAGNOSIS — Z96652 Presence of left artificial knee joint: Secondary | ICD-10-CM | POA: Diagnosis not present

## 2019-08-01 DIAGNOSIS — M1711 Unilateral primary osteoarthritis, right knee: Secondary | ICD-10-CM | POA: Diagnosis not present

## 2019-08-13 DIAGNOSIS — L570 Actinic keratosis: Secondary | ICD-10-CM | POA: Diagnosis not present

## 2019-08-23 DIAGNOSIS — L820 Inflamed seborrheic keratosis: Secondary | ICD-10-CM | POA: Diagnosis not present

## 2019-08-23 DIAGNOSIS — Z85828 Personal history of other malignant neoplasm of skin: Secondary | ICD-10-CM | POA: Diagnosis not present

## 2019-08-23 DIAGNOSIS — L821 Other seborrheic keratosis: Secondary | ICD-10-CM | POA: Diagnosis not present

## 2019-10-16 ENCOUNTER — Other Ambulatory Visit: Payer: Self-pay

## 2019-10-16 ENCOUNTER — Encounter: Payer: Self-pay | Admitting: *Deleted

## 2019-10-17 ENCOUNTER — Encounter: Payer: Self-pay | Admitting: Gynecology

## 2019-10-17 ENCOUNTER — Ambulatory Visit (INDEPENDENT_AMBULATORY_CARE_PROVIDER_SITE_OTHER): Payer: Medicare Other | Admitting: Gynecology

## 2019-10-17 VITALS — BP 112/72 | Ht 63.75 in | Wt 198.0 lb

## 2019-10-17 DIAGNOSIS — Z01419 Encounter for gynecological examination (general) (routine) without abnormal findings: Secondary | ICD-10-CM

## 2019-10-17 DIAGNOSIS — N898 Other specified noninflammatory disorders of vagina: Secondary | ICD-10-CM | POA: Diagnosis not present

## 2019-10-17 DIAGNOSIS — N952 Postmenopausal atrophic vaginitis: Secondary | ICD-10-CM | POA: Diagnosis not present

## 2019-10-17 DIAGNOSIS — Z9189 Other specified personal risk factors, not elsewhere classified: Secondary | ICD-10-CM

## 2019-10-17 LAB — WET PREP FOR TRICH, YEAST, CLUE

## 2019-10-17 MED ORDER — METRONIDAZOLE 0.75 % VA GEL: 1.0000 | Freq: Every day | VAGINAL | 0 refills | Status: DC

## 2019-10-17 NOTE — Progress Notes (Addendum)
    Patricia [name] 01/04/1951 [id number]        68 y.o.  G2P2002 for annual gynecologic exam.  Notes over the past several months vaginal odor and irritation primarily on the left labia.  No significant discharge.  No urinary symptoms  Past medical history,surgical history, problem list, medications, allergies, family history and social history were all reviewed and documented as reviewed in the EPIC chart.  ROS:  Performed with pertinent positives and negatives included in the history, assessment and plan.   Additional significant findings : None   Exam: Sharrie Rothman assistant Vitals:   10/17/19 1456  BP: 112/72  Weight: 198 lb (89.8 kg)  Height: 5' 3.75" (1.619 m)   Body mass index is 34.25 kg/m.  General appearance:  Normal affect, orientation and appearance. Skin: Grossly normal HEENT: Without gross lesions.  No cervical or supraclavicular adenopathy. Thyroid normal.  Lungs:  Clear without wheezing, rales or rhonchi Cardiac: RR, without RMG Abdominal:  Soft, nontender, without masses, guarding, rebound, organomegaly or hernia Breasts:  Examined lying and sitting without masses, retractions, discharge or axillary adenopathy.  Bilateral implants noted Pelvic:  Ext, BUS, Vagina: With atrophic changes.  Left labia minora slightly more prominent than right.  No abnormalities visualized or palpated.  Vagina with thick white discharge.  Wet prep done.  Pap smear done  Adnexa: Without masses or tenderness    Anus and perineum: Normal   Rectovaginal: Normal sphincter tone without palpated masses or tenderness.    Assessment/Plan:  68 y.o. [id number] female for annual gynecologic exam.  Status post TAH in the past.  1. Vaginal irritation/odor.  Thick white discharge.  Wet prep is negative.  Given the heavier discharge and complains of odor I am going to cover her for bacterial vaginosis.  [employer]el nightly x7 nights.  Follow-up if symptoms persist. 2. Mammography 2020.  Continue with annual  mammography next year.  Breast exam normal today. 3. Pap smear 2012.  Pap smear done today.  No history of abnormal Pap smears previously.  Options to stop screening per current screening guidelines reviewed. 4. DEXA 2011 normal.  Have recommended repeat DEXA in the past.  I again recommend repeat DEXA now and orders placed.  Patient agrees to schedule. 5. Colonoscopy 2018.  Repeat at their recommended interval. 6. Health maintenance.  No routine lab work done as patient does this elsewhere.  Follow-up 1 year, sooner as needed.   Anastasio Auerbach MD, 3:11 PM 10/17/2019

## 2019-10-17 NOTE — Patient Instructions (Addendum)
Use the prescribed antibiotic vaginal cream nightly x7 nights.  Follow-up if the vaginal odor and irritation continues.  Follow-up for the bone density as scheduled.  Follow-up in 1 year for annual exam

## 2019-10-18 LAB — PAP IG W/ RFLX HPV ASCU

## 2019-10-22 DIAGNOSIS — M25561 Pain in right knee: Secondary | ICD-10-CM | POA: Diagnosis not present

## 2019-10-22 DIAGNOSIS — M1712 Unilateral primary osteoarthritis, left knee: Secondary | ICD-10-CM | POA: Diagnosis not present

## 2019-10-22 DIAGNOSIS — M170 Bilateral primary osteoarthritis of knee: Secondary | ICD-10-CM | POA: Diagnosis not present

## 2019-10-22 DIAGNOSIS — M1711 Unilateral primary osteoarthritis, right knee: Secondary | ICD-10-CM | POA: Diagnosis not present

## 2019-10-23 DIAGNOSIS — L570 Actinic keratosis: Secondary | ICD-10-CM | POA: Diagnosis not present

## 2019-10-23 DIAGNOSIS — Z85828 Personal history of other malignant neoplasm of skin: Secondary | ICD-10-CM | POA: Diagnosis not present

## 2019-10-23 DIAGNOSIS — L820 Inflamed seborrheic keratosis: Secondary | ICD-10-CM | POA: Diagnosis not present

## 2019-10-23 DIAGNOSIS — L814 Other melanin hyperpigmentation: Secondary | ICD-10-CM | POA: Diagnosis not present

## 2019-10-25 ENCOUNTER — Telehealth: Payer: Self-pay | Admitting: Radiology

## 2019-10-25 ENCOUNTER — Ambulatory Visit: Payer: Medicare Other | Admitting: Cardiology

## 2019-10-25 ENCOUNTER — Other Ambulatory Visit: Payer: Self-pay

## 2019-10-25 ENCOUNTER — Encounter: Payer: Self-pay | Admitting: Cardiology

## 2019-10-25 VITALS — BP 118/78 | HR 73 | Ht 63.0 in | Wt 203.1 lb

## 2019-10-25 DIAGNOSIS — R002 Palpitations: Secondary | ICD-10-CM | POA: Diagnosis not present

## 2019-10-25 DIAGNOSIS — I77810 Thoracic aortic ectasia: Secondary | ICD-10-CM

## 2019-10-25 DIAGNOSIS — E782 Mixed hyperlipidemia: Secondary | ICD-10-CM | POA: Diagnosis not present

## 2019-10-25 DIAGNOSIS — I10 Essential (primary) hypertension: Secondary | ICD-10-CM | POA: Diagnosis not present

## 2019-10-25 DIAGNOSIS — I878 Other specified disorders of veins: Secondary | ICD-10-CM

## 2019-10-25 NOTE — Telephone Encounter (Signed)
Enrolled Patient for a 14 day [employer] Even Monitor to be mailed to patients home.   [employer] Telemetry was switch to [employer] due to insurance.

## 2019-10-25 NOTE — Progress Notes (Signed)
Cardiology Office Note   Date:  10/25/2019   ID:  Patricia [name], DOB 05/13/1951, MRN [id number]  PCP:  [name], MD   Cardiologist:  Dr. Marlou Porch     Chief Complaint  Patient presents with  . Hypertension      History of Present Illness: Patricia [name] is a 69 y.o. female who presents for HTN  She last saw Dr. Marlou Porch 02/27/19  She has hx of HTN, dilated aortic root.  Palpations.  In June CTA of chest with Ascending thoracic aortic diameter measures 4.4 x 4.4 cm, compared to 4.3 x 4.3 cm 2 years prior. There is no evident thoracic dissection.  1-2 mm nodular opacities on the right, stable. No lung edema or consolidation. Areas of slight atelectatic change.  Today she feels well. No chest pain or SOB, though she continues with bubble in chest that radiates to her Rt jaw on occ.  Has been happing for years. She also has flurries of rapid HR. This may be several times a day and then none or may have several times a week.  No lightheadedness or dizziness. She is aware of the arrhythmia.  We reviewed her CT of her chest and lung nodules.  Nodules have not changed.  We will continue to follow .  Her thoracic aortic diameter is stable. Will check echo next year.  Discussed exercise.    Past Medical History:  Diagnosis Date  . Aneurysm of aorta (HCC)   . Arthritis   . Atrophic vaginitis   . Cellulitis and abscess of right leg 05/07/2018  . Depression   . Diverticulitis   . DYSPNEA 06/04/2009   Qualifier: Diagnosis of  By: Elsworth Soho MD, Leanna Sato.    . Edema of both legs    She takes Lasix a couple of times per week to decrease swelling.    . Fatty liver   . Hyperlipidemia   . Hypertension   . Keratosis    hands and lower limbs  . Lymphedema    legs  . Memory difficulty   . Obesity   . Reflux   . Shingles   . Venous stasis    lower legs    Past Surgical History:  Procedure Laterality Date  . ABDOMINAL HYSTERECTOMY    . AUGMENTATION MAMMAPLASTY    . Basal and  Squamous cell cancers excised    . CESAREAN SECTION     X 2  . EYE SURGERY     bilateral cataract with lens implants  . KIDNEY STONE SURGERY     removal couldnt blast  . KNEE SURGERY    . lipoma surgery    . LIPOSUCTION     knees  . TOTAL KNEE ARTHROPLASTY Left 09/26/2018   Procedure: TOTAL KNEE ARTHROPLASTY;  Surgeon: Paralee Cancel, MD;  Location: [facility];  Service: Orthopedics;  Laterality: Left;  70 mins  . TOTAL KNEE ARTHROPLASTY     left  . tummy tuck       Current Outpatient Medications  Medication Sig Dispense Refill  . ALPRAZolam (XANAX) 0.5 MG tablet Take 0.5 mg by mouth as needed for anxiety. For travel    . amLODipine (NORVASC) 5 MG tablet Take 5 mg by mouth daily.    . benzonatate (TESSALON) 100 MG capsule TAKE 1 CAPSULE BY MOUTH THREE TIMES A DAY AS NEEDED FOR COUGH    . desvenlafaxine (PRISTIQ) 50 MG 24 hr tablet Take 50 mg by mouth daily.    Marland Kitchen esomeprazole (  NEXIUM) 20 MG capsule Take 20 mg by mouth daily.     . metoprolol succinate (TOPROL-XL) 50 MG 24 hr tablet Take 1 tablet (50 mg total) by mouth daily. Take with or immediately following a meal. 90 tablet 2  . polyethylene glycol (MIRALAX / GLYCOLAX) 17 g packet Take 17 g by mouth daily.    . simvastatin (ZOCOR) 20 MG tablet Take by mouth daily at 6 PM.     . SYMBICORT 80-4.5 MCG/ACT inhaler as needed.    . triamterene-hydrochlorothiazide (MAXZIDE-25) 37.5-25 MG per tablet Take 1 tablet by mouth daily.       No current facility-administered medications for this visit.    Allergies:   Ciprofloxacin, Clindamycin/lincomycin, Morphine, and Tetracyclines & related    Social History:  The patient  reports that she has quit smoking. She has a 25.00 pack-year smoking history. She has never used smokeless tobacco. She reports current alcohol use. She reports that she does not use drugs.   Family History:  The patient's family history includes Alzheimer's disease in her father; Bipolar disorder in her sister; Breast  cancer in her cousin; Cancer in her paternal grandmother; Depression in her sister; Diabetes in her mother; Diverticulitis in her maternal grandmother, paternal grandfather, and paternal grandmother; Heart disease in her mother; Hyperlipidemia in her mother; Hypertension in her father and mother; Liver cancer in her paternal grandfather; Uterine cancer in her mother.    ROS:  General:no colds or fevers, no weight changes Skin:no rashes or ulcers HEENT:no blurred vision, no congestion CV:see HPI PUL:see HPI GI:no diarrhea constipation or melena, no indigestion GU:no hematuria, no dysuria MS:no joint pain, no claudication Neuro:no syncope, no lightheadedness Endo:no diabetes, no thyroid disease  Wt Readings from Last 3 Encounters:  10/25/19 203 lb 1.9 oz (92.1 kg)  10/17/19 198 lb (89.8 kg)  02/27/19 187 lb 6.4 oz (85 kg)     PHYSICAL EXAM: VS:  BP 118/78   Pulse 73   Ht 5' 3"  (1.6 m)   Wt 203 lb 1.9 oz (92.1 kg)   SpO2 98%   BMI 35.98 kg/m  , BMI Body mass index is 35.98 kg/m. General:Pleasant affect, NAD Skin:Warm and dry, brisk capillary refill HEENT:normocephalic, sclera clear, mucus membranes moist Neck:supple, no JVD, no bruits  Heart:S1S2 RRR without murmur, gallup, rub or click Lungs:clear without rales, rhonchi, or wheezes YFV:CBSW, non tender, + BS, do not palpate liver spleen or masses Ext:1+  lower ext edema Rt > Lt - hx lympedema,+ varcosities, 2+ radial pulses Neuro:alert and oriented X 3, MAE, follows commands, + facial symmetry    EKG:  EKG is NOT ordered today.    Recent Labs: 03/14/2019: BUN 18; Creatinine, Ser 0.77; Potassium 4.2; Sodium 138    Lipid Panel No results found for: CHOL, TRIG, HDL, CHOLHDL, VLDL, LDLCALC, LDLDIRECT     Other studies Reviewed: Additional studies/ records that were reviewed today include: . Echo 05/26/18 Study Conclusions  - Left ventricle: The cavity size was normal. Wall thickness was   increased in a pattern of  mild LVH. Systolic function was normal.   The estimated ejection fraction was in the range of 55% to 60%.   Wall motion was normal; there were no regional wall motion   abnormalities. Doppler parameters are consistent with abnormal   left ventricular relaxation (grade 1 diastolic dysfunction). - Ascending aorta: The ascending aorta was mildly dilated.  Impressions:  - Normal LV systolic function; mild diastolic dysfunction; mild   LVH; mildly dilated  ascending aorta (4.5 cm); suggest CTA or MRA   to further assess.  CT of chest 03/21/19 FINDINGS: Cardiovascular: Ascending thoracic aortic diameter measures 4.4 x 4.4 cm, compared to 4.3 x 4.3 cm 2 years prior. There is no evident thoracic dissection. The measured diameter at the sinotubular junction measured on coronal re-formatted images is 3.4 cm. The measured diameter in the aortic arch region measures 3.3 cm, stable. The descending thoracic aortic measurement at the main pulmonary outflow tract is 2.6 x 2.5 cm. The visualized great vessels appear unremarkable. There are scattered foci of aortic atherosclerosis. There is no demonstrable pulmonary embolus. There is no pericardial effusion or pericardial thickening.  Mediastinum/Nodes: Thyroid appears unremarkable. There is no demonstrable thoracic adenopathy. There is a small hiatal hernia.  Lungs/Pleura: There is mild bibasilar atelectasis. There is no edema or consolidation. No pleural effusions are evident. There is a stable 2 mm nodular opacity abutting the pleura in the medial segment of the right middle lobe, seen on axial slice 65 series 5. There is a stable 1 mm nodular opacity along the lateral segment of the right lower lobe seen on axial slice 68 series 5. No new nodular appearing lesions are evident.  Upper Abdomen: There is a stable apparent adenoma in the left adrenal measuring 1.3 x 1.0 cm. There is hepatic steatosis. There is mild atherosclerotic  calcification in the upper abdominal aorta. Mild ectasia of the proximal celiac artery is stable.  Musculoskeletal: There are breast implants bilaterally. No blastic or lytic bone lesions. No chest wall lesions evident.  Review of the MIP images confirms the above findings.  IMPRESSION: 1. Ascending thoracic aorta has a maximum transverse diameter of 4.4 x 4.4 cm, compared to 4.3 x 4.3 cm on prior study 2 years earlier. No evident thoracic dissection. Recommend annual imaging followup by CTA or MRA. This recommendation follows 2010 ACCF/AHA/AATS/ACR/ASA/SCA/SCAI/SIR/STS/SVM Guidelines for the Diagnosis and Management of Patients with Thoracic Aortic Disease. Circulation. 2010; 121: O712-R975. Aortic aneurysm NOS (ICD10-I71.9). There is aortic atherosclerosis.  2.  No demonstrable pulmonary embolus.  3. 1-2 mm nodular opacities on the right, stable. No lung edema or consolidation. Areas of slight atelectatic change.  4.  No thoracic adenopathy evident.  5.  Small hiatal hernia.  6.  Small benign left adrenal adenoma, stable.  7.  Hepatic steatosis.  Aortic Atherosclerosis (ICD10-I70.0).    ASSESSMENT AND PLAN:  1.  HTN stable continue meds 2.  Aortic root dilatation stable check echo in June and follow up with Dr. Marlou Porch 3.  Palpitations - more frequent recently will check 2 week monitor, concern for PAF.  Discussed with pt 4.  Hx of lymphedema followed at [facility] and has compression pump, and compression stockings 5.   HLD followed by her PCP on statin   Wears mask, washes hands and practices social distances.   Current medicines are reviewed with the patient today.  The patient Has no concerns regarding medicines.  The following changes have been made:  See above Labs/ tests ordered today include:see above  Disposition:   FU:  see above  Signed, Cecilie Kicks, NP  10/25/2019 3:57 PM    [facility] [address], [place],  [place], [place] Phone: [phone]; Fax: [phone]

## 2019-10-25 NOTE — Patient Instructions (Signed)
Medication Instructions:   Your physician recommends that you continue on your current medications as directed. Please refer to the Current Medication list given to you today.  *If you need a refill on your cardiac medications before your next appointment, please call your pharmacy*  Lab Work:  None ordered today  Testing/Procedures:  A zio monitor was ordered today. It will remain on for 14 days. You will then return monitor and event diary in provided box. It takes 1-2 weeks for report to be downloaded and returned to [place]. We will call you with the results. If monitor falls off or has orange flashing light, please call Zio for further instructions.   Follow-Up: At [facility], you and your health needs are our priority.  As part of our continuing mission to provide you with exceptional heart care, we have created designated Provider Care Teams.  These Care Teams include your primary Cardiologist (physician) and Advanced Practice Providers (APPs -  Physician Assistants and Nurse Practitioners) who all work together to provide you with the care you need, when you need it.  Your next appointment:   6 month(s)  The format for your next appointment:   In Person  Provider:   You may see [name], MD or one of the following Advanced Practice Providers on your designated Care Team:    Truitt Merle, NP  Cecilie Kicks, NP  Kathyrn Drown, NP

## 2019-10-29 DIAGNOSIS — E669 Obesity, unspecified: Secondary | ICD-10-CM | POA: Diagnosis not present

## 2019-11-01 ENCOUNTER — Telehealth: Payer: Self-pay | Admitting: Cardiology

## 2019-11-01 ENCOUNTER — Encounter (INDEPENDENT_AMBULATORY_CARE_PROVIDER_SITE_OTHER): Payer: Medicare Other

## 2019-11-01 DIAGNOSIS — R002 Palpitations: Secondary | ICD-10-CM | POA: Diagnosis not present

## 2019-11-01 NOTE — Telephone Encounter (Signed)
New Message    Pt is calling and says she has gotten her monitor in the mail and is needing help with instructions on applying it     Please call back

## 2019-11-01 NOTE — Telephone Encounter (Signed)
Reviewed monitor components and instructions.

## 2019-11-09 ENCOUNTER — Ambulatory Visit: Payer: Medicare Other | Attending: Internal Medicine

## 2019-11-09 DIAGNOSIS — Z23 Encounter for immunization: Secondary | ICD-10-CM

## 2019-11-09 NOTE — Progress Notes (Signed)
   Covid-19 Vaccination Clinic  Name:  Patricia [name]    MRN: [id number] DOB: [birth year]  11/09/2019  [name] was observed post Covid-19 immunization for 15 minutes without incidence. She was provided with Vaccine Information Sheet and instruction to access the V-Safe system.   [name] was instructed to call 911 with any severe reactions post vaccine: Marland Kitchen Difficulty breathing  . Swelling of your face and throat  . A fast heartbeat  . A bad rash all over your body  . Dizziness and weakness    Immunizations Administered    Name Date Dose VIS Date Route   Pfizer COVID-19 Vaccine 11/09/2019  4:11 PM 0.3 mL 09/28/2019 Intramuscular   Manufacturer: [place]   Lot: [id number]   [place]: [id number]

## 2019-11-16 ENCOUNTER — Other Ambulatory Visit: Payer: Self-pay | Admitting: Cardiology

## 2019-11-16 DIAGNOSIS — R002 Palpitations: Secondary | ICD-10-CM

## 2019-11-26 ENCOUNTER — Telehealth: Payer: Self-pay | Admitting: Cardiology

## 2019-11-26 NOTE — Telephone Encounter (Signed)
Spoke with patient who reports she started having palpitations right after she turned in her monitor.  They have been ongoing since then.  Random but continual.  She is not sure if she has still been taking Metoprolol Succinate 50 mg as RXed but will check when she gets home.  She has been on the HCG diet that includes taking injections and also Phendimetrazine.  She can take this up to 3 times a day but has only been taking it once a day.  She reports she stopped taking it this weekend but has not noticed a change in the palpitations at this time.  Advised to limit any stimulants from medications, foods/drinks.  Advised to take Metoprolol succinate as Rxed and to look into phone apps like [employer] to help monitor her palps.  Advised if they continue and she develops s/s such as lightheadedness, dizziness feeling faint etc. To contact the office.  She states understanding and will c/b if further questions/needs.  She was grateful for the information.

## 2019-11-26 NOTE — Telephone Encounter (Signed)
Isaiah Serge, NP  11/20/2019 10:01 AM EST    Monitor was normal no premature beats and no atrial fib, all reassuring. Not sure what she is feeling.

## 2019-11-26 NOTE — Telephone Encounter (Signed)
Patient c/o Palpitations:  High priority if patient c/o lightheadedness, shortness of breath, or chest pain  1) How long have you had palpitations/irregular HR/ Afib? Are you having the symptoms now? Palpitations.   2) Are you currently experiencing lightheadedness, SOB or CP? no  3) Do you have a history of afib (atrial fibrillation) or irregular heart rhythm? Has felt palpitation before.  4) Have you checked your BP or HR? (document readings if available): checked HR - does not remember what it was. Did not check BP. Oxygen level was good.   5) Are you experiencing any other symptoms? States the palpitations started right after she mailed her heart monitor back. The palpitations were ongoing and lasted all night long.

## 2019-11-30 ENCOUNTER — Ambulatory Visit: Payer: Medicare Other | Attending: Internal Medicine

## 2019-11-30 DIAGNOSIS — H524 Presbyopia: Secondary | ICD-10-CM | POA: Diagnosis not present

## 2019-11-30 DIAGNOSIS — Z23 Encounter for immunization: Secondary | ICD-10-CM | POA: Insufficient documentation

## 2019-11-30 NOTE — Progress Notes (Signed)
   Covid-19 Vaccination Clinic  Name:  Patricia [name]    MRN: [id number] DOB: Sep 23, 1951  11/30/2019  Patricia [name] was observed post Covid-19 immunization for 15 minutes without incidence. She was provided with Vaccine Information Sheet and instruction to access the V-Safe system.   Patricia [name] was instructed to call 911 with any severe reactions post vaccine: Marland Kitchen Difficulty breathing  . Swelling of your face and throat  . A fast heartbeat  . A bad rash all over your body  . Dizziness and weakness    Immunizations Administered    Name Date Dose VIS Date Route   Pfizer COVID-19 Vaccine 11/30/2019  4:59 PM 0.3 mL 09/28/2019 Intramuscular   Manufacturer: [place]   Lot: [id number]   [place]: [id number]

## 2019-12-25 DIAGNOSIS — E6609 Other obesity due to excess calories: Secondary | ICD-10-CM | POA: Diagnosis not present

## 2019-12-25 DIAGNOSIS — Z6832 Body mass index (BMI) 32.0-32.9, adult: Secondary | ICD-10-CM | POA: Diagnosis not present

## 2020-01-30 DIAGNOSIS — F322 Major depressive disorder, single episode, severe without psychotic features: Secondary | ICD-10-CM | POA: Diagnosis not present

## 2020-01-30 DIAGNOSIS — E782 Mixed hyperlipidemia: Secondary | ICD-10-CM | POA: Diagnosis not present

## 2020-01-30 DIAGNOSIS — I10 Essential (primary) hypertension: Secondary | ICD-10-CM | POA: Diagnosis not present

## 2020-01-30 DIAGNOSIS — Z0000 Encounter for general adult medical examination without abnormal findings: Secondary | ICD-10-CM | POA: Diagnosis not present

## 2020-01-31 DIAGNOSIS — E6609 Other obesity due to excess calories: Secondary | ICD-10-CM | POA: Diagnosis not present

## 2020-01-31 DIAGNOSIS — Z6831 Body mass index (BMI) 31.0-31.9, adult: Secondary | ICD-10-CM | POA: Diagnosis not present

## 2020-02-20 DIAGNOSIS — Z85828 Personal history of other malignant neoplasm of skin: Secondary | ICD-10-CM | POA: Diagnosis not present

## 2020-02-20 DIAGNOSIS — L814 Other melanin hyperpigmentation: Secondary | ICD-10-CM | POA: Diagnosis not present

## 2020-02-27 DIAGNOSIS — M1711 Unilateral primary osteoarthritis, right knee: Secondary | ICD-10-CM | POA: Diagnosis not present

## 2020-02-27 DIAGNOSIS — M25561 Pain in right knee: Secondary | ICD-10-CM | POA: Diagnosis not present

## 2020-03-04 DIAGNOSIS — N3000 Acute cystitis without hematuria: Secondary | ICD-10-CM | POA: Diagnosis not present

## 2020-03-04 DIAGNOSIS — R350 Frequency of micturition: Secondary | ICD-10-CM | POA: Diagnosis not present

## 2020-03-11 DIAGNOSIS — Z6831 Body mass index (BMI) 31.0-31.9, adult: Secondary | ICD-10-CM | POA: Diagnosis not present

## 2020-03-11 DIAGNOSIS — E6609 Other obesity due to excess calories: Secondary | ICD-10-CM | POA: Diagnosis not present

## 2020-04-01 ENCOUNTER — Other Ambulatory Visit (HOSPITAL_COMMUNITY): Payer: Medicare Other

## 2020-04-18 ENCOUNTER — Other Ambulatory Visit (HOSPITAL_COMMUNITY): Payer: Medicare Other

## 2020-04-25 DIAGNOSIS — H524 Presbyopia: Secondary | ICD-10-CM | POA: Diagnosis not present

## 2020-04-25 DIAGNOSIS — Z1231 Encounter for screening mammogram for malignant neoplasm of breast: Secondary | ICD-10-CM | POA: Diagnosis not present

## 2020-04-28 ENCOUNTER — Other Ambulatory Visit: Payer: Self-pay

## 2020-04-28 ENCOUNTER — Ambulatory Visit: Payer: Medicare Other | Admitting: Cardiology

## 2020-04-28 ENCOUNTER — Encounter: Payer: Self-pay | Admitting: Cardiology

## 2020-04-28 VITALS — BP 110/70 | HR 63 | Ht 63.0 in | Wt 176.0 lb

## 2020-04-28 DIAGNOSIS — R0789 Other chest pain: Secondary | ICD-10-CM

## 2020-04-28 DIAGNOSIS — Z01812 Encounter for preprocedural laboratory examination: Secondary | ICD-10-CM | POA: Diagnosis not present

## 2020-04-28 DIAGNOSIS — I77810 Thoracic aortic ectasia: Secondary | ICD-10-CM | POA: Diagnosis not present

## 2020-04-28 MED ORDER — METOPROLOL TARTRATE 50 MG PO TABS: 50.0000 mg | ORAL_TABLET | Freq: Once | ORAL | 0 refills | Status: DC

## 2020-04-28 NOTE — Progress Notes (Signed)
Cardiology Office Note:    Date:  04/28/2020   ID:  Patricia [name], DOB [birth year], MRN [id number]  PCP:  [name], MD  Cardiologist:  [name], MD  Electrophysiologist:  None   Referring MD: [name], MD   No chief complaint on file. Here for the follow-up of hypertension, palpitations  History of Present Illness:    Patricia [name] is a 69 y.o. female with hypertension here for follow-up.  Toprol-XL 50 mg once a day because she was having both palpitations as well as increased blood pressure.  Event monitor was unrevealing.    Aortic root 4.5 cm to 4.3 cm, stable.  Monitoring as well.  Overall she has been experiencing some discomfort in her chest, radiating to her back, right jaw pain.  Does not seem to be exertional.  Personally reviewed with her a prior CT scan of chest without contrast that demonstrated coronary artery atherosclerosis/calcification.  See below for details.   Past Medical History:  Diagnosis Date  . Aneurysm of aorta (HCC)   . Arthritis   . Atrophic vaginitis   . Cellulitis and abscess of right leg 05/07/2018  . Depression   . Diverticulitis   . DYSPNEA 06/04/2009   Qualifier: Diagnosis of  By: Elsworth Soho MD, Leanna Sato.    . Edema of both legs    She takes Lasix a couple of times per week to decrease swelling.    . Fatty liver   . Hyperlipidemia   . Hypertension   . Keratosis    hands and lower limbs  . Lymphedema    legs  . Memory difficulty   . Obesity   . Reflux   . Shingles   . Venous stasis    lower legs    Past Surgical History:  Procedure Laterality Date  . ABDOMINAL HYSTERECTOMY    . AUGMENTATION MAMMAPLASTY    . Basal and Squamous cell cancers excised    . CESAREAN SECTION     X 2  . EYE SURGERY     bilateral cataract with lens implants  . KIDNEY STONE SURGERY     removal couldnt blast  . KNEE SURGERY    . lipoma surgery    . LIPOSUCTION     knees  . TOTAL KNEE ARTHROPLASTY Left 09/26/2018   Procedure:  TOTAL KNEE ARTHROPLASTY;  Surgeon: Paralee Cancel, MD;  Location: [facility];  Service: Orthopedics;  Laterality: Left;  70 mins  . TOTAL KNEE ARTHROPLASTY     left  . tummy tuck      Current Medications: Current Meds  Medication Sig  . ALPRAZolam (XANAX) 0.5 MG tablet Take 0.5 mg by mouth as needed for anxiety. For travel  . amLODipine (NORVASC) 5 MG tablet Take 5 mg by mouth daily.  . benzonatate (TESSALON) 100 MG capsule TAKE 1 CAPSULE BY MOUTH THREE TIMES A DAY AS NEEDED FOR COUGH  . desvenlafaxine (PRISTIQ) 50 MG 24 hr tablet Take 50 mg by mouth daily.  Marland Kitchen esomeprazole (NEXIUM) 20 MG capsule Take 20 mg by mouth daily.   . metoprolol succinate (TOPROL-XL) 50 MG 24 hr tablet Take 1 tablet (50 mg total) by mouth daily. Take with or immediately following a meal.  . polyethylene glycol (MIRALAX / GLYCOLAX) 17 g packet Take 17 g by mouth daily.  . simvastatin (ZOCOR) 20 MG tablet Take by mouth daily at 6 PM.   . SYMBICORT 80-4.5 MCG/ACT inhaler as needed.  . triamterene-hydrochlorothiazide (MAXZIDE-25) 37.5-25 MG per tablet  Take 1 tablet by mouth daily.       Allergies:   Ciprofloxacin, Clindamycin/lincomycin, Morphine, and Tetracyclines & related   Social History   Socioeconomic History  . Marital status: Married    Spouse name: [name]  . Number of children: 2  . Years of education: 30  . Highest education level: Not on file  Occupational History  . Occupation: Music therapist: [employer]    Comment: SELF-EMPLOYED   Tobacco Use  . Smoking status: Former Smoker    Packs/day: 1.00    Years: 25.00    Pack years: 25.00  . Smokeless tobacco: Never Used  Vaping Use  . Vaping Use: Never used  Substance and Sexual Activity  . Alcohol use: Yes    Alcohol/week: 0.0 standard drinks    Comment: Rare  . Drug use: No  . Sexual activity: Yes    Birth control/protection: Surgical    Comment: HYST-1st intercourse 69 yo-Fewer than 5 partners  Other Topics Concern  .  Not on file  Social History Narrative   Marital Status: Married Engineer, technical sales)   Children:  Sons (2)    Pets: Cat    Living Situation: Lives with husband    Occupation: Therapist, occupational - [employer]   Education: Programmer, systems    Tobacco Use/Exposure:  None    Alcohol Use:  Occasional   Drug Use:  None   Diet:  Regular   Exercise:  Bikram Yoga    Hobbies: Gardening/ Reading   Patient is right handed               Social Determinants of Radio broadcast assistant Strain:   . Difficulty of Paying Living Expenses:   Food Insecurity:   . Worried About Charity fundraiser in the Last Year:   . Arboriculturist in the Last Year:   Transportation Needs:   . Film/video editor (Medical):   Marland Kitchen Lack of Transportation (Non-Medical):   Physical Activity:   . Days of Exercise per Week:   . Minutes of Exercise per Session:   Stress:   . Feeling of Stress :   Social Connections:   . Frequency of Communication with Friends and Family:   . Frequency of Social Gatherings with Friends and Family:   . Attends Religious Services:   . Active Member of Clubs or Organizations:   . Attends Archivist Meetings:   Marland Kitchen Marital Status:      Family History: The patient's family history includes Alzheimer's disease in her father; Bipolar disorder in her sister; Breast cancer in her cousin; Cancer in her paternal grandmother; Depression in her sister; Diabetes in her mother; Diverticulitis in her maternal grandmother, paternal grandfather, and paternal grandmother; Heart disease in her mother; Hyperlipidemia in her mother; Hypertension in her father and mother; Liver cancer in her paternal grandfather; Uterine cancer in her mother.  ROS:   Please see the history of present illness.     All other systems reviewed and are negative.  EKGs/Labs/Other Studies Reviewed:    The following studies were reviewed today: Prior echocardiogram and CT of aorta  EKG:  EKG is   ordered today.  The ekg ordered today demonstrates sinus rhythm 63 with no other abnormalities , prior sinus bradycardia 57 with no other significant abnormalities personally reviewed and interpreted  Recent Labs: No results found for requested labs within last 8760 hours.  Recent Lipid Panel  No results found for: CHOL, TRIG, HDL, CHOLHDL, VLDL, LDLCALC, LDLDIRECT  Physical Exam:    VS:  BP 110/70   Pulse 63   Ht 5' 3"  (1.6 m)   Wt 176 lb (79.8 kg)   SpO2 97%   BMI 31.18 kg/m     Wt Readings from Last 3 Encounters:  04/28/20 176 lb (79.8 kg)  10/25/19 203 lb 1.9 oz (92.1 kg)  10/17/19 198 lb (89.8 kg)     GEN: Well nourished, well developed, in no acute distress  HEENT: normal  Neck: no JVD, carotid bruits, or masses Cardiac: RRR; no murmurs, rubs, or gallops,no edema  Respiratory:  clear to auscultation bilaterally, normal work of breathing GI: soft, nontender, nondistended, + BS MS: no deformity or atrophy  Skin: warm and dry, no rash Neuro:  Alert and Oriented x 3, Strength and sensation are intact Psych: euthymic mood, full affect   ASSESSMENT:    1. Pre-procedure lab exam   2. Other chest pain   3. Dilated aortic root (HCC)    PLAN:    In order of problems listed above:  Chest discomfort/coronary atherosclerosis/coronary calcification -Coronary calcification noted in the proximal RCA, LAD and diagonal branch, circumflex as well.  Occasional discomfort short-lived radiating to back and to right jaw.  Nonexertional.  Given the anginal discomfort, we will go ahead and proceed with coronary CT scan with possible FFR analysis.  This will also allow [place] to visualize her ascending aorta and to measure this once again.  Palpitations -Random.  Taking metoprolol 50.  Event monitor placed, unremarkable.  January 2021 CO no premature beats no atrial fibrillation.  Dilated aortic root -4.5 to 4.3 cm.  Checking coronary CT scan soon.  Avoiding Cipro  Essential  hypertension -Started on Toprol-XL 50 once a day.  Amlodipine 5,  Maxide-- triamterene hydrochlorothiazide. -Toprol was added because of palpitations as well. Doing very well with decreased palpitations.  Hyperlipidemia -LDL 185 off of meds.  Not interested in medication.  Encouraged heart healthy diet.   Medication Adjustments/Labs and Tests Ordered: Current medicines are reviewed at length with the patient today.  Concerns regarding medicines are outlined above.  Orders Placed This Encounter  Procedures  . CT CORONARY MORPH W/CTA COR W/SCORE W/CA W/CM &/OR WO/CM  . CT CORONARY FRACTIONAL FLOW RESERVE DATA PREP  . CT CORONARY FRACTIONAL FLOW RESERVE FLUID ANALYSIS  . Basic metabolic panel  . EKG 12-Lead   Meds ordered this encounter  Medications  . metoprolol tartrate (LOPRESSOR) 50 MG tablet    Sig: Take 1 tablet (50 mg total) by mouth once for 1 dose. Take (1) tablet 2 hours before your Coronary CT    Dispense:  1 tablet    Refill:  0    Patient Instructions  Medication Instructions:  Please take Lopressor 50 mg 2 hours before your Coronary CT.  Continue all other medication as listed.  *If you need a refill on your cardiac medications before your next appointment, please call your pharmacy*  Lab Work: You will need to have blood work before your coronary CT.  It will be scheduled once your CT has been scheduled.  If you have labs (blood work) drawn today and your tests are completely normal, you will receive your results only by: Marland Kitchen MyChart Message (if you have MyChart) OR . A paper copy in the mail If you have any lab test that is abnormal or we need to change your treatment, we will call you to  review the results.   Testing/Procedures: Your physician has requested that you have Coronary CT. Cardiac computed tomography (CT) is a painless test that uses an x-ray machine to take clear, detailed pictures of your heart.   Follow-Up: At [facility], you and your health  needs are our priority.  As part of our continuing mission to provide you with exceptional heart care, we have created designated Provider Care Teams.  These Care Teams include your primary Cardiologist (physician) and Advanced Practice Providers (APPs -  Physician Assistants and Nurse Practitioners) who all work together to provide you with the care you need, when you need it.  We recommend signing up for the patient portal called "MyChart".  Sign up information is provided on this After Visit Summary.  MyChart is used to connect with patients for Virtual Visits (Telemedicine).  Patients are able to view lab/test results, encounter notes, upcoming appointments, etc.  Non-urgent messages can be sent to your provider as well.   To learn more about what you can do with MyChart, go to [email or web address].    Your next appointment:   12 month(s)  The format for your next appointment:   In Person  Provider:   [name], MD   Thank you for choosing [facility]!!    Your cardiac CT will be scheduled at:   [facility] [address] [place], [place] [address] [phone]   Please arrive at the [facility] 30 minutes prior to test start time. Proceed to the [facility] Radiology Department (first floor) to check-in and test prep.  Please follow these instructions carefully (unless otherwise directed):  On the Night Before the Test: . Be sure to Drink plenty of water. . Do not consume any caffeinated/decaffeinated beverages or chocolate 12 hours prior to your test. . Do not take any antihistamines 12 hours prior to your test.   On the Day of the Test: . Drink plenty of water. Do not drink any water within one hour of the test. . Do not eat any food 4 hours prior to the test. . You may take your regular medications prior to the test.  . Take metoprolol (Lopressor) two hours prior to test. . HOLD  Furosemide/Hydrochlorothiazide morning of the test. . FEMALES- please wear underwire-free bra if available     After the Test: . Drink plenty of water. . After receiving IV contrast, you may experience a mild flushed feeling. This is normal. . On occasion, you may experience a mild rash up to 24 hours after the test. This is not dangerous. If this occurs, you can take Benadryl 25 mg and increase your fluid intake. . If you experience trouble breathing, this can be serious. If it is severe call 911 IMMEDIATELY. If it is mild, please call our office.   Once we have confirmed authorization from your insurance company, we will call you to set up a date and time for your test. Based on how quickly your insurance processes prior authorizations requests, please allow up to 4 weeks to be contacted for scheduling your Cardiac CT appointment. Be advised that routine Cardiac CT appointments could be scheduled as many as 8 weeks after your provider has ordered it.  For non-scheduling related questions, please contact the cardiac imaging nurse navigator should you have any questions/concerns: Marchia Bond, Cardiac Imaging Nurse Navigator Burley Saver, Interim Cardiac Imaging Nurse [place] and Vascular Services Direct Office Dial: [phone]  For scheduling needs, including cancellations and rescheduling, please call Vivien Rota at [phone], option 3.         Signed, [name], MD  04/28/2020 5:33 PM    [place]

## 2020-04-28 NOTE — Patient Instructions (Addendum)
Medication Instructions:  Please take Lopressor 50 mg 2 hours before your Coronary CT.  Continue all other medication as listed.  *If you need a refill on your cardiac medications before your next appointment, please call your pharmacy*  Lab Work: You will need to have blood work before your coronary CT.  It will be scheduled once your CT has been scheduled.  If you have labs (blood work) drawn today and your tests are completely normal, you will receive your results only by: Marland Kitchen MyChart Message (if you have MyChart) OR . A paper copy in the mail If you have any lab test that is abnormal or we need to change your treatment, we will call you to review the results.   Testing/Procedures: Your physician has requested that you have Coronary CT. Cardiac computed tomography (CT) is a painless test that uses an x-ray machine to take clear, detailed pictures of your heart.   Follow-Up: At [facility], you and your health needs are our priority.  As part of our continuing mission to provide you with exceptional heart care, we have created designated Provider Care Teams.  These Care Teams include your primary Cardiologist (physician) and Advanced Practice Providers (APPs -  Physician Assistants and Nurse Practitioners) who all work together to provide you with the care you need, when you need it.  We recommend signing up for the patient portal called "MyChart".  Sign up information is provided on this After Visit Summary.  MyChart is used to connect with patients for Virtual Visits (Telemedicine).  Patients are able to view lab/test results, encounter notes, upcoming appointments, etc.  Non-urgent messages can be sent to your provider as well.   To learn more about what you can do with MyChart, go to [email or web address].    Your next appointment:   12 month(s)  The format for your next appointment:   In Person  Provider:   [name], MD   Thank you for choosing [facility]!!    Your cardiac CT will be scheduled at:   [facility] [address] [place], [place] [address] [phone]   Please arrive at the [facility] main entrance of [facility] 30 minutes prior to test start time. Proceed to the [facility] Radiology Department (first floor) to check-in and test prep.  Please follow these instructions carefully (unless otherwise directed):  On the Night Before the Test: . Be sure to Drink plenty of water. . Do not consume any caffeinated/decaffeinated beverages or chocolate 12 hours prior to your test. . Do not take any antihistamines 12 hours prior to your test.   On the Day of the Test: . Drink plenty of water. Do not drink any water within one hour of the test. . Do not eat any food 4 hours prior to the test. . You may take your regular medications prior to the test.  . Take metoprolol (Lopressor) two hours prior to test. . HOLD Furosemide/Hydrochlorothiazide morning of the test. . FEMALES- please wear underwire-free bra if available     After the Test: . Drink plenty of water. . After receiving IV contrast, you may experience a mild flushed feeling. This is normal. . On occasion, you may experience a mild rash up to 24 hours after the test. This is not dangerous. If this occurs, you can take Benadryl 25 mg and increase your fluid intake. . If you experience trouble breathing, this can be serious. If it is severe call 911  IMMEDIATELY. If it is mild, please call our office.   Once we have confirmed authorization from your insurance company, we will call you to set up a date and time for your test. Based on how quickly your insurance processes prior authorizations requests, please allow up to 4 weeks to be contacted for scheduling your Cardiac CT appointment. Be advised that routine Cardiac CT appointments could be scheduled as many as 8 weeks after your provider has ordered it.  For non-scheduling related  questions, please contact the cardiac imaging nurse navigator should you have any questions/concerns: Marchia Bond, Cardiac Imaging Nurse Navigator Burley Saver, Interim Cardiac Imaging Nurse [place] and Vascular Services Direct Office Dial: [phone]   For scheduling needs, including cancellations and rescheduling, please call Vivien Rota at [phone], option 3.

## 2020-05-02 DIAGNOSIS — E782 Mixed hyperlipidemia: Secondary | ICD-10-CM | POA: Diagnosis not present

## 2020-05-06 DIAGNOSIS — L820 Inflamed seborrheic keratosis: Secondary | ICD-10-CM | POA: Diagnosis not present

## 2020-05-06 DIAGNOSIS — L821 Other seborrheic keratosis: Secondary | ICD-10-CM | POA: Diagnosis not present

## 2020-05-06 DIAGNOSIS — Z85828 Personal history of other malignant neoplasm of skin: Secondary | ICD-10-CM | POA: Diagnosis not present

## 2020-05-07 ENCOUNTER — Other Ambulatory Visit: Payer: Self-pay

## 2020-05-07 ENCOUNTER — Ambulatory Visit (HOSPITAL_COMMUNITY): Payer: Medicare Other | Attending: Cardiovascular Disease

## 2020-05-07 DIAGNOSIS — E785 Hyperlipidemia, unspecified: Secondary | ICD-10-CM | POA: Diagnosis not present

## 2020-05-07 DIAGNOSIS — Z6831 Body mass index (BMI) 31.0-31.9, adult: Secondary | ICD-10-CM | POA: Diagnosis not present

## 2020-05-07 DIAGNOSIS — R002 Palpitations: Secondary | ICD-10-CM | POA: Diagnosis not present

## 2020-05-07 DIAGNOSIS — Z87891 Personal history of nicotine dependence: Secondary | ICD-10-CM | POA: Insufficient documentation

## 2020-05-07 DIAGNOSIS — I77810 Thoracic aortic ectasia: Secondary | ICD-10-CM | POA: Diagnosis not present

## 2020-05-07 DIAGNOSIS — E669 Obesity, unspecified: Secondary | ICD-10-CM | POA: Insufficient documentation

## 2020-05-07 DIAGNOSIS — E6609 Other obesity due to excess calories: Secondary | ICD-10-CM | POA: Diagnosis not present

## 2020-05-07 DIAGNOSIS — I10 Essential (primary) hypertension: Secondary | ICD-10-CM | POA: Diagnosis not present

## 2020-05-07 LAB — ECHOCARDIOGRAM COMPLETE
Area-P 1/2: 2.56 cm2
S' Lateral: 2.7 cm

## 2020-05-15 ENCOUNTER — Telehealth (HOSPITAL_COMMUNITY): Payer: Self-pay | Admitting: *Deleted

## 2020-05-15 NOTE — Telephone Encounter (Signed)
Reaching out to patient to offer assistance regarding upcoming cardiac imaging study; pt verbalizes understanding of appt date/time, parking situation and where to check in, pre-test NPO status and medications ordered, and verified current allergies; name and call back number provided for further questions should they arise  [place] and Vascular [phone] office [phone] cell

## 2020-05-16 ENCOUNTER — Other Ambulatory Visit: Payer: Medicare Other | Admitting: *Deleted

## 2020-05-16 ENCOUNTER — Other Ambulatory Visit: Payer: Self-pay

## 2020-05-16 DIAGNOSIS — R0789 Other chest pain: Secondary | ICD-10-CM | POA: Diagnosis not present

## 2020-05-16 DIAGNOSIS — Z01812 Encounter for preprocedural laboratory examination: Secondary | ICD-10-CM | POA: Diagnosis not present

## 2020-05-16 DIAGNOSIS — I77810 Thoracic aortic ectasia: Secondary | ICD-10-CM | POA: Diagnosis not present

## 2020-05-16 LAB — BASIC METABOLIC PANEL
BUN/Creatinine Ratio: 19 (ref 12–28)
BUN: 12 mg/dL (ref 8–27)
CO2: 27 mmol/L (ref 20–29)
Calcium: 9 mg/dL (ref 8.7–10.3)
Chloride: 103 mmol/L (ref 96–106)
Creatinine, Ser: 0.63 mg/dL (ref 0.57–1.00)
GFR calc Af Amer: 105 mL/min/{1.73_m2} (ref >59)
GFR calc non Af Amer: 91 mL/min/{1.73_m2} (ref >59)
Glucose: 104 mg/dL — ABNORMAL HIGH (ref 65–99)
Potassium: 4.4 mmol/L (ref 3.5–5.2)
Sodium: 142 mmol/L (ref 134–144)

## 2020-05-18 ENCOUNTER — Other Ambulatory Visit: Payer: Self-pay | Admitting: Cardiology

## 2020-05-19 ENCOUNTER — Ambulatory Visit (HOSPITAL_COMMUNITY)
Admission: RE | Admit: 2020-05-19 | Discharge: 2020-05-19 | Disposition: A | Discharge status: Home / Self Care | Payer: Medicare Other | Source: Ambulatory Visit | Attending: Cardiology | Admitting: Cardiology

## 2020-05-19 ENCOUNTER — Other Ambulatory Visit: Payer: Self-pay

## 2020-05-19 DIAGNOSIS — I517 Cardiomegaly: Secondary | ICD-10-CM | POA: Diagnosis not present

## 2020-05-19 DIAGNOSIS — R0789 Other chest pain: Secondary | ICD-10-CM | POA: Diagnosis not present

## 2020-05-19 DIAGNOSIS — I719 Aortic aneurysm of unspecified site, without rupture: Secondary | ICD-10-CM | POA: Diagnosis not present

## 2020-05-19 DIAGNOSIS — I2510 Atherosclerotic heart disease of native coronary artery without angina pectoris: Secondary | ICD-10-CM | POA: Diagnosis not present

## 2020-05-19 MED ORDER — IOHEXOL 350 MG/ML SOLN
80.0000 mL | Freq: Once | INTRAVENOUS | Status: AC | PRN
  Administered 2020-05-19: 80 mL via INTRAVENOUS

## 2020-05-19 MED ORDER — NITROGLYCERIN 0.4 MG SL SUBL
SUBLINGUAL_TABLET | SUBLINGUAL | Status: AC
  Administered 2020-05-19: 0.8 mg via SUBLINGUAL
  Filled 2020-05-19: qty 2

## 2020-05-19 MED ORDER — NITROGLYCERIN 0.4 MG SL SUBL: 0.8000 mg | SUBLINGUAL_TABLET | Freq: Once | SUBLINGUAL | Status: AC

## 2020-05-21 ENCOUNTER — Other Ambulatory Visit: Payer: Self-pay | Admitting: *Deleted

## 2020-05-21 ENCOUNTER — Telehealth: Payer: Self-pay | Admitting: *Deleted

## 2020-05-21 DIAGNOSIS — I719 Aortic aneurysm of unspecified site, without rupture: Secondary | ICD-10-CM | POA: Diagnosis not present

## 2020-05-21 DIAGNOSIS — I77810 Thoracic aortic ectasia: Secondary | ICD-10-CM

## 2020-05-21 DIAGNOSIS — R0789 Other chest pain: Secondary | ICD-10-CM | POA: Diagnosis not present

## 2020-05-21 DIAGNOSIS — E782 Mixed hyperlipidemia: Secondary | ICD-10-CM

## 2020-05-21 DIAGNOSIS — I10 Essential (primary) hypertension: Secondary | ICD-10-CM

## 2020-05-21 MED ORDER — ROSUVASTATIN CALCIUM 10 MG PO TABS: 10.0000 mg | ORAL_TABLET | Freq: Every day | ORAL | 3 refills | Status: DC

## 2020-05-21 NOTE — Telephone Encounter (Signed)
Patient notified. Per last office note patient was not on medication for hyperlipidemia.  Patient reports she has been taking simvastatin 20 mg.  She states she has been taking intermittently and not on a regular basis.   Had lab work done in the last month at PCP. Has not gotten results. I told her I would request lab work and clarify with Dr Marlou Porch if patient should change to Crestor 10 mg.  Lab work requested from PCP (was done 05/02/20) Appointment scheduled for November 5,2021 with Cecilie Kicks, NP

## 2020-05-21 NOTE — Telephone Encounter (Signed)
Thanks for the clarification.  I would rather her be on Crestor 10 mg once a day, more potent statin, better tolerated and newer than simvastatin. [name], MD

## 2020-05-21 NOTE — Telephone Encounter (Signed)
Patient notified. She will stop simvastatin and start Crestor 10 mg daily.  Lab work to be done on 11/5 when patient sees Cecilie Kicks, NP.  Will send prescription to [facility]

## 2020-05-21 NOTE — Telephone Encounter (Signed)
-----   Message from Jerline Pain, MD sent at 05/21/2020  6:14 AM EDT ----- Coronary artery disease is present.  Moderate disease.  There is no evidence of flow limitation by FFR, flow analysis.  Coronary calcium score is 148 which is 88th percentile for her age.  Elevated.  Ascending aorta is 43 mm, dilated and stable.  Recommendation is to begin Crestor 10 mg once a day to help stabilize plaque and reduce risks of heart attack.    In 3 months after starting medication, please check lipid panel and ALT.  Set up follow-up with Cecilie Kicks, NP in 3 months.  [name], MD

## 2020-06-25 DIAGNOSIS — J029 Acute pharyngitis, unspecified: Secondary | ICD-10-CM | POA: Diagnosis not present

## 2020-06-25 DIAGNOSIS — Z03818 Encounter for observation for suspected exposure to other biological agents ruled out: Secondary | ICD-10-CM | POA: Diagnosis not present

## 2020-07-31 DIAGNOSIS — M1711 Unilateral primary osteoarthritis, right knee: Secondary | ICD-10-CM | POA: Diagnosis not present

## 2020-08-02 ENCOUNTER — Ambulatory Visit: Payer: Medicare Other | Attending: Internal Medicine

## 2020-08-02 DIAGNOSIS — Z23 Encounter for immunization: Secondary | ICD-10-CM

## 2020-08-02 NOTE — Progress Notes (Signed)
° °  Covid-19 Vaccination Clinic  Name:  Patricia [name]    MRN: [id number] DOB: Dec 11, 1950  08/02/2020  Patricia [name] was observed post Covid-19 immunization for 15 minutes without incident. She was provided with Vaccine Information Sheet and instruction to access the V-Safe system.   Patricia [name] was instructed to call 911 with any severe reactions post vaccine:  Difficulty breathing   Swelling of face and throat   A fast heartbeat   A bad rash all over body   Dizziness and weakness

## 2020-08-05 DIAGNOSIS — Z03818 Encounter for observation for suspected exposure to other biological agents ruled out: Secondary | ICD-10-CM | POA: Diagnosis not present

## 2020-08-05 DIAGNOSIS — Z20822 Contact with and (suspected) exposure to covid-19: Secondary | ICD-10-CM | POA: Diagnosis not present

## 2020-08-07 DIAGNOSIS — I10 Essential (primary) hypertension: Secondary | ICD-10-CM | POA: Diagnosis not present

## 2020-08-07 DIAGNOSIS — E669 Obesity, unspecified: Secondary | ICD-10-CM | POA: Diagnosis not present

## 2020-08-07 DIAGNOSIS — F322 Major depressive disorder, single episode, severe without psychotic features: Secondary | ICD-10-CM | POA: Diagnosis not present

## 2020-08-07 DIAGNOSIS — E782 Mixed hyperlipidemia: Secondary | ICD-10-CM | POA: Diagnosis not present

## 2020-08-18 ENCOUNTER — Encounter: Payer: Self-pay | Admitting: Counselor

## 2020-08-22 ENCOUNTER — Ambulatory Visit: Payer: Medicare Other | Admitting: Cardiology

## 2020-08-22 ENCOUNTER — Encounter: Payer: Self-pay | Admitting: Cardiology

## 2020-08-22 ENCOUNTER — Other Ambulatory Visit: Payer: Self-pay

## 2020-08-22 ENCOUNTER — Other Ambulatory Visit: Payer: Medicare Other | Admitting: *Deleted

## 2020-08-22 VITALS — BP 120/70 | HR 72 | Ht 63.0 in | Wt 189.0 lb

## 2020-08-22 DIAGNOSIS — I10 Essential (primary) hypertension: Secondary | ICD-10-CM | POA: Diagnosis not present

## 2020-08-22 DIAGNOSIS — I2510 Atherosclerotic heart disease of native coronary artery without angina pectoris: Secondary | ICD-10-CM | POA: Diagnosis not present

## 2020-08-22 DIAGNOSIS — L821 Other seborrheic keratosis: Secondary | ICD-10-CM | POA: Diagnosis not present

## 2020-08-22 DIAGNOSIS — E782 Mixed hyperlipidemia: Secondary | ICD-10-CM

## 2020-08-22 DIAGNOSIS — D0462 Carcinoma in situ of skin of left upper limb, including shoulder: Secondary | ICD-10-CM | POA: Diagnosis not present

## 2020-08-22 DIAGNOSIS — I77810 Thoracic aortic ectasia: Secondary | ICD-10-CM | POA: Diagnosis not present

## 2020-08-22 DIAGNOSIS — Z85828 Personal history of other malignant neoplasm of skin: Secondary | ICD-10-CM | POA: Diagnosis not present

## 2020-08-22 DIAGNOSIS — R002 Palpitations: Secondary | ICD-10-CM

## 2020-08-22 DIAGNOSIS — L570 Actinic keratosis: Secondary | ICD-10-CM | POA: Diagnosis not present

## 2020-08-22 DIAGNOSIS — D485 Neoplasm of uncertain behavior of skin: Secondary | ICD-10-CM | POA: Diagnosis not present

## 2020-08-22 LAB — LIPID PANEL
Chol/HDL Ratio: 2.6 ratio (ref 0.0–4.4)
Cholesterol, Total: 173 mg/dL (ref 100–199)
HDL: 67 mg/dL (ref >39)
LDL Chol Calc (NIH): 88 mg/dL (ref 0–99)
Triglycerides: 97 mg/dL (ref 0–149)
VLDL Cholesterol Cal: 18 mg/dL (ref 5–40)

## 2020-08-22 LAB — ALT: ALT: 22 IU/L (ref 0–32)

## 2020-08-22 NOTE — Patient Instructions (Addendum)
Medication Instructions:  Your physician recommends that you continue on your current medications as directed. Please refer to the Current Medication list given to you today.  *If you need a refill on your cardiac medications before your next appointment, please call your pharmacy*   Lab Work: None ordered  If you have labs (blood work) drawn today and your tests are completely normal, you will receive your results only by: Marland Kitchen MyChart Message (if you have MyChart) OR . A paper copy in the mail If you have any lab test that is abnormal or we need to change your treatment, we will call you to review the results.   Testing/Procedures: None ordered   Follow-Up: At [facility], you and your health needs are our priority.  As part of our continuing mission to provide you with exceptional heart care, we have created designated Provider Care Teams.  These Care Teams include your primary Cardiologist (physician) and Advanced Practice Providers (APPs -  Physician Assistants and Nurse Practitioners) who all work together to provide you with the care you need, when you need it.  We recommend signing up for the patient portal called "MyChart".  Sign up information is provided on this After Visit Summary.  MyChart is used to connect with patients for Virtual Visits (Telemedicine).  Patients are able to view lab/test results, encounter notes, upcoming appointments, etc.  Non-urgent messages can be sent to your provider as well.   To learn more about what you can do with MyChart, go to [email or web address].    Your next appointment:   6 month(s)  The format for your next appointment:   In Person  Provider:   You may see [name], MD or one of the following Advanced Practice Providers on your designated Care Team:    Truitt Merle, NP  Cecilie Kicks, NP  Kathyrn Drown, NP    Other Instructions

## 2020-08-22 NOTE — Progress Notes (Signed)
Cardiology Office Note   Date:  08/22/2020   ID:  Patricia [name], DOB 03-10-1951, MRN [id number]  PCP:  [name], MD  Cardiologist:  Dr. Marlou Porch     Chief Complaint  Patient presents with  . Hypertension      History of Present Illness: Patricia [name] is a 69 y.o. female who presents for hypertension here for follow-up.  Toprol-XL 50 mg once a day because she was having both palpitations as well as increased blood pressure.  Event monitor was unrevealing.    Aortic root 4.5 cm to 4.3 cm, stable.  Monitoring as well.  Overall she has been experiencing some discomfort in her chest, radiating to her back, right jaw pain.  Does not seem to be exertional.  Personally reviewed with her a prior CT scan of chest without contrast that demonstrated coronary artery atherosclerosis/calcification.  See below for details. Coronary CT  dilated aortic root check coronary CT HTN on amlodipine hcta and torpol XL  Elevated LDL at 185 refused meds. In past. Cardiac CTA with moderate disease no flow limitations.  Calcium score 348  Ascending aorta is 43 mm dialted and stable. Added crestor.  05/2020 Had hepatic and lipids done results pending  Today she is doing well, tolerating crestor without problems.  Few palpitations, on rare occ.  Has fullness in chest like a buble that resolves.nposible esophageal spasm or GERD on prilosec.  If continues should see GI.  Some DOE  Has not exercised in several months and not eating well.  Discussed resuming her prior healthy lifestyle.    Past Medical History:  Diagnosis Date  . Aneurysm of aorta (HCC)   . Arthritis   . Atrophic vaginitis   . Cellulitis and abscess of right leg 05/07/2018  . Depression   . Diverticulitis   . DYSPNEA 06/04/2009   Qualifier: Diagnosis of  By: Elsworth Soho MD, Leanna Sato.    . Edema of both legs    She takes Lasix a couple of times per week to decrease swelling.    . Fatty liver   . Hyperlipidemia   .  Hypertension   . Keratosis    hands and lower limbs  . Lymphedema    legs  . Memory difficulty   . Obesity   . Reflux   . Shingles   . Venous stasis    lower legs    Past Surgical History:  Procedure Laterality Date  . ABDOMINAL HYSTERECTOMY    . AUGMENTATION MAMMAPLASTY    . Basal and Squamous cell cancers excised    . CESAREAN SECTION     X 2  . EYE SURGERY     bilateral cataract with lens implants  . KIDNEY STONE SURGERY     removal couldnt blast  . KNEE SURGERY    . lipoma surgery    . LIPOSUCTION     knees  . TOTAL KNEE ARTHROPLASTY Left 09/26/2018   Procedure: TOTAL KNEE ARTHROPLASTY;  Surgeon: Paralee Cancel, MD;  Location: [facility];  Service: Orthopedics;  Laterality: Left;  70 mins  . TOTAL KNEE ARTHROPLASTY     left  . tummy tuck       Current Outpatient Medications  Medication Sig Dispense Refill  . ALPRAZolam (XANAX) 0.5 MG tablet Take 0.5 mg by mouth as needed for anxiety. For travel    . amLODipine (NORVASC) 5 MG tablet Take 5 mg by mouth daily.    . benzonatate (TESSALON) 100 MG capsule  TAKE 1 CAPSULE BY MOUTH THREE TIMES A DAY AS NEEDED FOR COUGH    . desvenlafaxine (PRISTIQ) 50 MG 24 hr tablet Take 50 mg by mouth daily.    Marland Kitchen esomeprazole (NEXIUM) 20 MG capsule Take 20 mg by mouth daily.     . metoprolol succinate (TOPROL-XL) 50 MG 24 hr tablet TAKE ONE TABLET BY MOUTH ONE TIME DAILY WITH OR IMMEDIATLEY FOLLOWING A MEAL 90 tablet 3  . polyethylene glycol (MIRALAX / GLYCOLAX) 17 g packet Take 17 g by mouth daily.    . rosuvastatin (CRESTOR) 10 MG tablet Take 1 tablet (10 mg total) by mouth daily. 90 tablet 3  . SYMBICORT 80-4.5 MCG/ACT inhaler as needed.    . triamterene-hydrochlorothiazide (MAXZIDE-25) 37.5-25 MG per tablet Take 1 tablet by mouth daily.       No current facility-administered medications for this visit.    Allergies:   Ciprofloxacin, Clindamycin/lincomycin, Morphine, and Tetracyclines & related    Social History:  The patient   reports that she has quit smoking. She has a 25.00 pack-year smoking history. She has never used smokeless tobacco. She reports current alcohol use. She reports that she does not use drugs.   Family History:  The patient's family history includes Alzheimer's disease in her father; Bipolar disorder in her sister; Breast cancer in her cousin; Cancer in her paternal grandmother; Depression in her sister; Diabetes in her mother; Diverticulitis in her maternal grandmother, paternal grandfather, and paternal grandmother; Heart disease in her mother; Hyperlipidemia in her mother; Hypertension in her father and mother; Liver cancer in her paternal grandfather; Uterine cancer in her mother.    ROS:  General:no colds or fevers, no weight changes Skin:no rashes or ulcers HEENT:no blurred vision, no congestion CV:see HPI PUL:see HPI GI:no diarrhea constipation or melena, no indigestion GU:no hematuria, no dysuria MS:no joint pain, no claudication Neuro:no syncope, no lightheadedness Endo:no diabetes, no thyroid disease Wt Readings from Last 3 Encounters:  08/22/20 189 lb (85.7 kg)  04/28/20 176 lb (79.8 kg)  10/25/19 203 lb 1.9 oz (92.1 kg)     PHYSICAL EXAM: VS:  BP 120/70   Pulse 72   Ht 5' 3"  (1.6 m)   Wt 189 lb (85.7 kg)   SpO2 97%   BMI 33.48 kg/m  , BMI Body mass index is 33.48 kg/m. General:Pleasant affect, NAD Skin:Warm and dry, brisk capillary refill HEENT:normocephalic, sclera clear, mucus membranes moist Neck:supple, no JVD, no bruits  Heart:S1S2 RRR without murmur, gallup, rub or click Lungs:clear without rales, rhonchi, or wheezes NOT:RRNH, non tender, + BS, do not palpate liver spleen or masses Ext:no lower ext edema, 2+ pedal pulses, 2+ radial pulses Neuro:alert and oriented X3, MAE, follows commands, + facial symmetry    EKG:  EKG is NOT ordered today.    Recent Labs: 05/16/2020: BUN 12; Creatinine, Ser 0.63; Potassium 4.4; Sodium 142 08/22/2020: ALT 22    Lipid  Panel    Component Value Date/Time   CHOL 173 08/22/2020 0823   TRIG 97 08/22/2020 0823   HDL 67 08/22/2020 0823   CHOLHDL 2.6 08/22/2020 0823   LDLCALC 88 08/22/2020 0823       Other studies Reviewed: Additional studies/ records that were reviewed today include: cardiac CT .IMPRESSION: 1. Coronary calcium score of 348. This was 85 percentile for age and sex matched control.  2. Normal coronary origin with right dominance.  3.  There is LAD calcified plaque proximally with 0-24% stenosis.  4. There is Ramus mixed plaque  proximally with 50-69% stenosis. Sending for FFR analysis. No flow limiting.  5.  There is RCA calcified plaque with 0-24% stenosis.  6. Dilated ascending aorta, 43 mm, stable from prior. Continue to monitor annually.   ASSESSMENT AND PLAN:  1.  CAD but not flow limiting, need to use statin, healthy diet and exercise.  Discussed with pt. Will follow up with Dr. Marlou Porch in 6 months   2.  4.3 cm ascending thoracic aortic aneurysm, stable  On BB  3.  HLD on crestor now and repeated lipids today.  4.  Palpations rare on BB   5.  HTN controlled     Current medicines are reviewed with the patient today.  The patient Has no concerns regarding medicines.  The following changes have been made:  See above Labs/ tests ordered today include:see above  Disposition:   FU:  see above  Signed, Cecilie Kicks, NP  08/22/2020 9:48 PM    [facility] [place], [place], [place] [place], [place] Phone: [phone]; Fax: [phone]

## 2020-08-27 ENCOUNTER — Telehealth: Payer: Self-pay | Admitting: *Deleted

## 2020-08-27 DIAGNOSIS — Z79899 Other long term (current) drug therapy: Secondary | ICD-10-CM

## 2020-08-27 DIAGNOSIS — E782 Mixed hyperlipidemia: Secondary | ICD-10-CM

## 2020-08-27 MED ORDER — ROSUVASTATIN CALCIUM 20 MG PO TABS: 20.0000 mg | ORAL_TABLET | Freq: Every day | ORAL | 3 refills | Status: DC

## 2020-08-27 NOTE — Telephone Encounter (Signed)
LDL now 88. Great response to Crestor 73m.  Let's increase Crestor to 257m- goal dose - to further protect you and stabilize plaque.  Repeat lipids in 3 months with ALT  MaCandee FurbishMD   Pt is aware of lab results, to increase Crestor to 20 mg daily and to repeat lab in 3 months.  [facility]

## 2020-09-08 DIAGNOSIS — Z85828 Personal history of other malignant neoplasm of skin: Secondary | ICD-10-CM | POA: Diagnosis not present

## 2020-09-08 DIAGNOSIS — D0462 Carcinoma in situ of skin of left upper limb, including shoulder: Secondary | ICD-10-CM | POA: Diagnosis not present

## 2020-09-29 ENCOUNTER — Encounter: Payer: Self-pay | Admitting: Podiatry

## 2020-09-29 ENCOUNTER — Ambulatory Visit (INDEPENDENT_AMBULATORY_CARE_PROVIDER_SITE_OTHER): Payer: Medicare Other

## 2020-09-29 ENCOUNTER — Ambulatory Visit: Payer: Medicare Other | Admitting: Podiatry

## 2020-09-29 ENCOUNTER — Other Ambulatory Visit: Payer: Self-pay

## 2020-09-29 DIAGNOSIS — M21612 Bunion of left foot: Secondary | ICD-10-CM

## 2020-09-29 DIAGNOSIS — M21619 Bunion of unspecified foot: Secondary | ICD-10-CM

## 2020-09-29 NOTE — Patient Instructions (Signed)
Bunion  A bunion is a bump on the base of the big toe that forms when the bones of the big toe joint move out of position. Bunions may be small at first, but they often get larger over time. They can make walking painful. What are the causes? A bunion may be caused by:  Wearing narrow or pointed shoes that force the big toe to press against the other toes.  Abnormal foot development that causes the foot to roll inward (pronate).  Changes in the foot that are caused by certain diseases, such as rheumatoid arthritis or polio.  A foot injury. What increases the risk? The following factors may make you more likely to develop this condition:  Wearing shoes that squeeze the toes together.  Having certain diseases, such as: ? Rheumatoid arthritis. ? Polio. ? Cerebral palsy.  Having family members who have bunions.  Being born with a foot deformity, such as flat feet or low arches.  Doing activities that put a lot of pressure on the feet, such as ballet dancing. What are the signs or symptoms? The main symptom of a bunion is a noticeable bump on the big toe. Other symptoms may include:  Pain.  Swelling around the big toe.  Redness and inflammation.  Thick or hardened skin on the big toe or between the toes.  Stiffness or loss of motion in the big toe.  Trouble with walking. How is this diagnosed? A bunion may be diagnosed based on your symptoms, medical history, and activities. You may have tests, such as:  X-rays. These allow your health care provider to check the position of the bones in your foot and look for damage to your joint. They also help your health care provider determine the severity of your bunion and the best way to treat it.  Joint aspiration. In this test, a sample of fluid is removed from the toe joint. This test may be done if you are in a lot of pain. It helps rule out diseases that cause painful swelling of the joints, such as arthritis. How is this  treated? Treatment depends on the severity of your symptoms. The goal of treatment is to relieve symptoms and prevent the bunion from getting worse. Your health care provider may recommend:  Wearing shoes that have a wide toe box.  Using bunion pads to cushion the affected area.  Taping your toes together to keep them in a normal position.  Placing a device inside your shoe (orthotics) to help reduce pressure on your toe joint.  Taking medicine to ease pain, inflammation, and swelling.  Applying heat or ice to the affected area.  Doing stretching exercises.  Surgery to remove scar tissue and move the toes back into their normal position. This treatment is rare. Follow these instructions at home: Managing pain, stiffness, and swelling   If directed, put ice on the painful area: ? Put ice in a plastic bag. ? Place a towel between your skin and the bag. ? Leave the ice on for 20 minutes, 2-3 times a day. Activity   If directed, apply heat to the affected area before you exercise. Use the heat source that your health care provider recommends, such as a moist heat pack or a heating pad. ? Place a towel between your skin and the heat source. ? Leave the heat on for 20-30 minutes. ? Remove the heat if your skin turns bright red. This is especially important if you are unable to feel pain,  heat, or cold. You may have a greater risk of getting burned.  Do exercises as told by your health care provider. General instructions  Support your toe joint with proper footwear, shoe padding, or taping as told by your health care provider.  Take over-the-counter and prescription medicines only as told by your health care provider.  Keep all follow-up visits as told by your health care provider. This is important. Contact a health care provider if your symptoms:  Get worse.  Do not improve in 2 weeks. Get help right away if you have:  Severe pain and trouble with walking. Summary  A  bunion is a bump on the base of the big toe that forms when the bones of the big toe joint move out of position.  Bunions can make walking painful.  Treatment depends on the severity of your symptoms.  Support your toe joint with proper footwear, shoe padding, or taping as told by your health care provider. This information is not intended to replace advice given to you by your health care provider. Make sure you discuss any questions you have with your health care provider. Document Revised: 04/10/2018 Document Reviewed: 02/14/2018 Elsevier Patient Education  [employer].

## 2020-10-01 ENCOUNTER — Encounter: Payer: Self-pay | Admitting: Counselor

## 2020-10-01 ENCOUNTER — Ambulatory Visit (INDEPENDENT_AMBULATORY_CARE_PROVIDER_SITE_OTHER): Payer: Medicare Other | Admitting: Counselor

## 2020-10-01 ENCOUNTER — Other Ambulatory Visit: Payer: Self-pay

## 2020-10-01 DIAGNOSIS — F329 Major depressive disorder, single episode, unspecified: Secondary | ICD-10-CM | POA: Diagnosis not present

## 2020-10-01 DIAGNOSIS — F32A Depression, unspecified: Secondary | ICD-10-CM

## 2020-10-01 DIAGNOSIS — Z658 Other specified problems related to psychosocial circumstances: Secondary | ICD-10-CM | POA: Diagnosis not present

## 2020-10-01 DIAGNOSIS — F09 Unspecified mental disorder due to known physiological condition: Secondary | ICD-10-CM | POA: Diagnosis not present

## 2020-10-01 NOTE — Progress Notes (Signed)
Subjective:   Patient ID: Patricia [name], female   DOB: 69 y.o.   MRN: [id number]   HPI Patient presents stating she has had a painful bunion left that she is wanted to get corrected she is interested in what kind of procedure we can do to fix this.  Patient does not smoke likes to be active but has trouble wearing shoe gear and has tried wider shoes has tried soaks and oral anti-inflammatories without relief   Review of Systems  All other systems reviewed and are negative.       Objective:  Physical Exam Vitals and nursing note reviewed.  Constitutional:      Appearance: She is well-developed and well-nourished.  Cardiovascular:     Pulses: Intact distal pulses.  Pulmonary:     Effort: Pulmonary effort is normal.  Musculoskeletal:        General: Normal range of motion.  Skin:    General: Skin is warm.  Neurological:     Mental Status: She is alert.     Neurovascular status found to be intact muscle strength was found to be adequate range of motion within normal limits.  Patient is noted to have redness and pain around the first metatarsal head left with inflammation noted of the joint surface.  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  I educated her on this explained procedure she wants to get this done but needs to wait till next year and is encouraged to call with all questions concerns and will be seen prior to surgery to go over what will be required to fix this  X-ray indicates elevation of the intermetatarsal angle of approximate 15 degrees bilateral
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
## 2020-10-02 NOTE — Progress Notes (Signed)
[facility]  Patient Name: Patricia [name] MRN: [id number] Date of Birth: 08-09-1951 Age: 69 y.o. Education: 12 years  Measurement properties of test scores: IQ, Index, and Standard Scores (SS): Mean = 100; Standard Deviation = 15 Scaled Scores (Ss): Mean = 10; Standard Deviation = 3 Z scores (Z): Mean = 0; Standard Deviation = 1 T scores (T); Mean = 50; Standard Deviation = 10  TEST SCORES:    Note: This summary of test scores accompanies the interpretive report and should not be interpreted by unqualified individuals or in isolation without reference to the report. Test scores are relative to age, gender, and educational history as available and appropriate.   Performance Validity        "A" Random Letter Test Raw  Descriptor      Errors 0 Within Expectation  The Dot Counting Test: 12 Within Expectation      Mental Status Screening     Total Score Descriptor  MoCA 26 Normal      Expected Functioning        Wide Range Achievement Test: Standard/Scaled Score Percentile      Word Reading 99 47      Reynolds Intellectual Screening Test Standard/T-score Percentile      Guess What 59 82      Odd Item Out 52 58  RIST Index 110 75      Attention/Processing Speed        Neuropsychological Assessment Battery (Attention Module, Form 1): Scaled/T-score Percentile     Digits Forward 43 25     Digits Backwards 46 34      Repeatable Battery for the Assessment of Neuropsychological Status (Form A): Standard Score Percentile     Coding 9 37      Language        Neuropsychological Assessment Battery (Language Module, Form 1): T-score Percentile      Naming   (31) 59 82      Verbal Fluency:  T Score Percentile      Controlled Oral Word Association (F-A-S) 43 25      Semantic Fluency (Animals) 62 88      Memory:        Neuropsychological Assessment Battery (Memory Module, Form 1): T-score/Standard Score Percentile  Memory Index  (MEM): 103 58      List Learning           List A Immediate Recall   (4 , 6 , 9) 46 34         List B Immediate Recall   (4) 51 54         List A Short Delayed Recall   (9) 60 84         List A Long Delayed Recall   (6) 48 42         List A Percent Retention   (67 %) --- 21         List A Long Delayed Yes/No Recognition Hits   (11) --- 58         List A Long Delayed Yes/No Recognition False Alarms   (4) --- 50         List A Recognition Discriminability Index --- 54      Shape Learning           Immediate Recognition   (5 , 3 , 6) 50 50         Delayed Recognition   (7) 63 91  Percent Retention   (117 %) --- 66         Delayed Forced-Choice Recognition Hits   (6) --- 5         Delayed Forced-Choice Recognition False Alarms   (0) --- 75         Delayed Forced-Choice Recognition Discriminability --- 38     Story Learning           Immediate Recall   (23, 35) 48 42         Delayed Recall   (31) 50 50         Percent Retention   (89 %) --- 42      Daily Living Memory            Immediate Recall   (24, 17) 51 54          Delayed Recall   (9, 5) 52 58          Percent Retention (93 %) --- 69          Recognition Hits   (9) --- 62      Repeatable Battery for the Assessment of Neuropsychological Status (Form A): Scaled Score Percentile         Figure Recall   (4) 4 2      Visuospatial/Constructional Functioning        Repeatable Battery for the Assessment of Neuropsychological Status (Form A): Standard/Scaled Score Percentile      Visuospatial/Constructional Index 105 63         Figure Copy   (18) 10 50         Judgment of Line Orientation   (18) --- 51-75      Executive Functioning        Modified [employer] Test (MWCST): Standard/T-Score Percentile      Number of Categories Correct 60 84      Number of Perseverative Errors 67 96      Number of Total Errors 66 95      Percent Perseverative Errors 65 93  Executive Function Composite 122 93      Trail Making  Test: T-Score Percentile      Part A 68 96      Part B 45 31      [place] Diagnostic Aphasia Exam: Raw Score Scaled Score      Complex Ideational Material 12 12      Rating Scales         Raw Score Descriptor  Patient Health Questionnaire - 9 15 Moderately-Severe  GAD-7 8 Mild   Chera Slivka V. Nicole Kindred PsyD, [place] Clinical Neuropsychologist
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
## 2020-10-08 ENCOUNTER — Encounter: Payer: Medicare Other | Admitting: Counselor

## 2020-10-20 DIAGNOSIS — Z20822 Contact with and (suspected) exposure to covid-19: Secondary | ICD-10-CM | POA: Diagnosis not present

## 2020-10-20 DIAGNOSIS — R059 Cough, unspecified: Secondary | ICD-10-CM | POA: Diagnosis not present

## 2020-10-20 DIAGNOSIS — J22 Unspecified acute lower respiratory infection: Secondary | ICD-10-CM | POA: Diagnosis not present

## 2020-10-22 DIAGNOSIS — J22 Unspecified acute lower respiratory infection: Secondary | ICD-10-CM | POA: Diagnosis not present

## 2020-10-22 DIAGNOSIS — Z03818 Encounter for observation for suspected exposure to other biological agents ruled out: Secondary | ICD-10-CM | POA: Diagnosis not present

## 2020-10-28 ENCOUNTER — Telehealth: Payer: Self-pay | Admitting: *Deleted

## 2020-10-28 NOTE — Telephone Encounter (Signed)
Spoke with pt regarding EKG - pt reports she needs her last EKG faxed to [facility] - attn: Carmen for her upcoming scheduled procedure.  EKG faxed to # provided with confirmation received.

## 2020-11-18 ENCOUNTER — Encounter: Payer: Medicare Other | Admitting: Counselor

## 2020-11-25 ENCOUNTER — Other Ambulatory Visit: Payer: Medicare Other

## 2020-11-25 ENCOUNTER — Other Ambulatory Visit: Payer: Self-pay

## 2020-11-25 ENCOUNTER — Encounter: Payer: Medicare Other | Admitting: Counselor

## 2020-11-25 DIAGNOSIS — Z79899 Other long term (current) drug therapy: Secondary | ICD-10-CM | POA: Diagnosis not present

## 2020-11-25 DIAGNOSIS — E782 Mixed hyperlipidemia: Secondary | ICD-10-CM

## 2020-11-25 LAB — LIPID PANEL
Chol/HDL Ratio: 2.9 ratio (ref 0.0–4.4)
Cholesterol, Total: 141 mg/dL (ref 100–199)
HDL: 49 mg/dL (ref >39)
LDL Chol Calc (NIH): 68 mg/dL (ref 0–99)
Triglycerides: 138 mg/dL (ref 0–149)
VLDL Cholesterol Cal: 24 mg/dL (ref 5–40)

## 2020-11-25 LAB — ALT: ALT: 18 IU/L (ref 0–32)

## 2020-11-27 ENCOUNTER — Other Ambulatory Visit: Payer: Medicare Other

## 2020-11-28 ENCOUNTER — Encounter: Payer: Self-pay | Admitting: *Deleted

## 2020-12-02 ENCOUNTER — Other Ambulatory Visit: Payer: Self-pay

## 2020-12-02 ENCOUNTER — Encounter: Payer: Self-pay | Admitting: Counselor

## 2020-12-02 ENCOUNTER — Ambulatory Visit (INDEPENDENT_AMBULATORY_CARE_PROVIDER_SITE_OTHER): Payer: Medicare Other | Admitting: Counselor

## 2020-12-02 DIAGNOSIS — F329 Major depressive disorder, single episode, unspecified: Secondary | ICD-10-CM

## 2020-12-02 DIAGNOSIS — F32A Depression, unspecified: Secondary | ICD-10-CM

## 2020-12-02 NOTE — Patient Instructions (Signed)
We discussed your test performance today, and the good news is that everything looked normal! This is good news. Studies have shown that neuropsychological testing can detect changes that predict dementia as much as 10 years before it occurs, so this is reassuring.   Depression can affect cognitive functioning in several ways. For one, there are neurobiological changes in depression that can contribute to attention, concentration, and memory problems. These changes are so common they are part of the criteria we use to diagnose depressive disorders. Depression can also negatively impact your own appraisal of your cognitive abilities, leading you to feel like you are performing more poorly than is objectively warranted.  We discussed the role that mood, depression, stressors, and anxiety play in regulating cognitive efficiency. You were interested in and accepted a referral to [facility], they will be in touch about an appointment.   Avoid overfocusing on cognitive performance. Memory and cognition are notoriously fallible and if you are looking for cognitive problems, you are bound to find them. Once someone gets worried about their memory and thinking, they may overfocus on how they are doing day-to-day, and then when normal day-to-day cognitive errors are made, this becomes a cause for more concern. This concern and anxiety then decreases focus from the task at hand, reducing concentration, causing more cognitive problems, and creating a vicious cycle. Rather than critiquing your performance, I would encourage you to remain present minded and focus on the task at hand. Perhaps most importantly, have reasonable expectations for yourself.  Healthy people forget things, lose focus, and do not perform 100% correctly all the time. Some cognitive errors are normal and are not necessarily a sign that there is something wrong with your brain.

## 2020-12-02 NOTE — Progress Notes (Signed)
NEUROPSYCHOLOGY FEEDBACK NOTE [facility]  Feedback Note: I met with Patricia [name] to review the findings resulting from her neuropsychological evaluation. Since the last appointment, she has continued to worry about the situation with her mother, it is "horrendous." She feels a bit more depressed, although it is up and down and she does not feel that way all the time. Time was spent reviewing the impressions and recommendations that are detailed in the evaluation report. We discussed impression of normal cognitive performance, without any real areas of significant weakness, as reflected in the patient instructions. She was open to and accepted a referral to [facility], which I placed today. I took time to explain the findings and answer all the patient's questions. I encouraged Ms. Birt to contact me should she have any further questions or if further follow up is desired.   Current Medications and Medical History   Current Outpatient Medications  Medication Sig Dispense Refill  . ALPRAZolam (XANAX) 0.5 MG tablet Take 0.5 mg by mouth as needed for anxiety. For travel    . amLODipine (NORVASC) 5 MG tablet Take 5 mg by mouth daily.    . benzonatate (TESSALON) 100 MG capsule TAKE 1 CAPSULE BY MOUTH THREE TIMES A DAY AS NEEDED FOR COUGH    . desvenlafaxine (PRISTIQ) 50 MG 24 hr tablet Take 50 mg by mouth daily.    Marland Kitchen esomeprazole (NEXIUM) 20 MG capsule Take 20 mg by mouth daily.     . metoprolol succinate (TOPROL-XL) 50 MG 24 hr tablet TAKE ONE TABLET BY MOUTH ONE TIME DAILY WITH OR IMMEDIATLEY FOLLOWING A MEAL 90 tablet 3  . polyethylene glycol (MIRALAX / GLYCOLAX) 17 g packet Take 17 g by mouth daily.    . rosuvastatin (CRESTOR) 20 MG tablet Take 1 tablet (20 mg total) by mouth daily. 90 tablet 3  . SYMBICORT 80-4.5 MCG/ACT inhaler as needed.    . triamterene-hydrochlorothiazide (MAXZIDE-25) 37.5-25 MG per tablet Take 1 tablet by mouth daily.       No current  facility-administered medications for this visit.    Patient Active Problem List   Diagnosis Date Noted  . Allergic rhinitis 05/15/2019  . Amnesia 05/15/2019  . Bunion 05/15/2019  . Colon, diverticulosis 05/15/2019  . NASH (nonalcoholic steatohepatitis) 05/15/2019  . Varicose veins of other specified sites 05/15/2019  . Pain in thumb joint with movement of left hand 03/29/2019  . Pain in thumb joint with movement, right 03/29/2019  . S/P left TKA 09/26/2018  . Osteoarthritis of left knee 09/06/2018  . Fatigue 08/07/2018  . Need for influenza vaccination 08/07/2018  . Venous stasis syndrome 07/18/2018  . Lymphedema of right lower extremity 05/29/2018  . Cellulitis 05/07/2018  . Cellulitis and abscess of right leg 05/07/2018  . Pain in left knee 12/09/2017  . Lipoma 10/29/2014  . Encounter for consultation 11/06/2013  . Essential hypertension, benign 02/26/2013  . Obesity, unspecified 02/26/2013  . Aneurysm of aorta (HCC)   . Depression   . Fatty liver   . Reflux   . Atrophic vaginitis   . DYSPNEA 06/04/2009  . HYPERLIPIDEMIA 06/03/2009  . HYPERTENSION 06/03/2009  . MITRAL VALVE PROLAPSE 06/03/2009  . Esophageal reflux 06/03/2009    Mental Status and Behavioral Observations  [name] presented on time to the present encounter and was alert and generally oriented. Speech was normal in rate, rhythm, volume, and prosody. Self-reported mood was "up and down" and affect was variable with the topics of conversation  discussed. Thought process was logical and goal oriented and thought content was appropriate. There were no safety concerns identified at today's encounter, such as thoughts of harming self or others.   Plan  Feedback provided regarding the patient's neuropsychological evaluation. She was offered and accepted a referral to [facility]. She does not need neuropsychological follow up but she is of course welcome to return. We have a strong  baseline against which to compare her performance in the future. [name] was encouraged to contact me if any questions arise or if further follow up is desired.   Viviano Simas Nicole Kindred, PsyD, ABN Clinical Neuropsychologist  Service(s) Provided at This Encounter: 97 minutes [phone]; Psychotherapy with patient/family)

## 2021-01-06 DIAGNOSIS — J209 Acute bronchitis, unspecified: Secondary | ICD-10-CM | POA: Diagnosis not present

## 2021-01-23 DIAGNOSIS — D485 Neoplasm of uncertain behavior of skin: Secondary | ICD-10-CM | POA: Diagnosis not present

## 2021-01-23 DIAGNOSIS — L570 Actinic keratosis: Secondary | ICD-10-CM | POA: Diagnosis not present

## 2021-01-23 DIAGNOSIS — L820 Inflamed seborrheic keratosis: Secondary | ICD-10-CM | POA: Diagnosis not present

## 2021-01-23 DIAGNOSIS — Z85828 Personal history of other malignant neoplasm of skin: Secondary | ICD-10-CM | POA: Diagnosis not present

## 2021-01-23 DIAGNOSIS — D0461 Carcinoma in situ of skin of right upper limb, including shoulder: Secondary | ICD-10-CM | POA: Diagnosis not present

## 2021-01-24 DIAGNOSIS — J029 Acute pharyngitis, unspecified: Secondary | ICD-10-CM | POA: Diagnosis not present

## 2021-01-24 DIAGNOSIS — R509 Fever, unspecified: Secondary | ICD-10-CM | POA: Diagnosis not present

## 2021-01-24 DIAGNOSIS — R059 Cough, unspecified: Secondary | ICD-10-CM | POA: Diagnosis not present

## 2021-01-24 DIAGNOSIS — J069 Acute upper respiratory infection, unspecified: Secondary | ICD-10-CM | POA: Diagnosis not present

## 2021-01-26 DIAGNOSIS — M1711 Unilateral primary osteoarthritis, right knee: Secondary | ICD-10-CM | POA: Diagnosis not present

## 2021-02-26 DIAGNOSIS — R1032 Left lower quadrant pain: Secondary | ICD-10-CM | POA: Diagnosis not present

## 2021-02-26 DIAGNOSIS — K589 Irritable bowel syndrome without diarrhea: Secondary | ICD-10-CM | POA: Diagnosis not present

## 2021-02-26 DIAGNOSIS — K5792 Diverticulitis of intestine, part unspecified, without perforation or abscess without bleeding: Secondary | ICD-10-CM | POA: Diagnosis not present

## 2021-02-26 DIAGNOSIS — K5904 Chronic idiopathic constipation: Secondary | ICD-10-CM | POA: Diagnosis not present

## 2021-03-10 DIAGNOSIS — E669 Obesity, unspecified: Secondary | ICD-10-CM | POA: Diagnosis not present

## 2021-03-10 DIAGNOSIS — R5383 Other fatigue: Secondary | ICD-10-CM | POA: Diagnosis not present

## 2021-03-11 DIAGNOSIS — U071 COVID-19: Secondary | ICD-10-CM | POA: Diagnosis not present

## 2021-03-11 DIAGNOSIS — Z7189 Other specified counseling: Secondary | ICD-10-CM | POA: Diagnosis not present

## 2021-04-21 DIAGNOSIS — L570 Actinic keratosis: Secondary | ICD-10-CM | POA: Diagnosis not present

## 2021-04-21 DIAGNOSIS — Z85828 Personal history of other malignant neoplasm of skin: Secondary | ICD-10-CM | POA: Diagnosis not present

## 2021-04-21 DIAGNOSIS — L821 Other seborrheic keratosis: Secondary | ICD-10-CM | POA: Diagnosis not present

## 2021-04-27 DIAGNOSIS — E669 Obesity, unspecified: Secondary | ICD-10-CM | POA: Diagnosis not present

## 2021-04-27 DIAGNOSIS — F322 Major depressive disorder, single episode, severe without psychotic features: Secondary | ICD-10-CM | POA: Diagnosis not present

## 2021-04-27 DIAGNOSIS — E782 Mixed hyperlipidemia: Secondary | ICD-10-CM | POA: Diagnosis not present

## 2021-04-27 DIAGNOSIS — Z0000 Encounter for general adult medical examination without abnormal findings: Secondary | ICD-10-CM | POA: Diagnosis not present

## 2021-04-27 DIAGNOSIS — Z713 Dietary counseling and surveillance: Secondary | ICD-10-CM | POA: Diagnosis not present

## 2021-04-27 DIAGNOSIS — I10 Essential (primary) hypertension: Secondary | ICD-10-CM | POA: Diagnosis not present

## 2021-04-29 ENCOUNTER — Other Ambulatory Visit: Payer: Self-pay | Admitting: Advanced Practice Midwife

## 2021-04-29 DIAGNOSIS — E2839 Other primary ovarian failure: Secondary | ICD-10-CM

## 2021-04-29 DIAGNOSIS — Z13820 Encounter for screening for osteoporosis: Secondary | ICD-10-CM

## 2021-05-01 DIAGNOSIS — Z1231 Encounter for screening mammogram for malignant neoplasm of breast: Secondary | ICD-10-CM | POA: Diagnosis not present

## 2021-05-21 ENCOUNTER — Other Ambulatory Visit: Payer: Self-pay

## 2021-05-21 ENCOUNTER — Ambulatory Visit (HOSPITAL_COMMUNITY): Payer: Medicare Other | Attending: Cardiology

## 2021-05-21 DIAGNOSIS — I10 Essential (primary) hypertension: Secondary | ICD-10-CM | POA: Diagnosis not present

## 2021-05-21 DIAGNOSIS — I77810 Thoracic aortic ectasia: Secondary | ICD-10-CM | POA: Diagnosis not present

## 2021-05-21 LAB — ECHOCARDIOGRAM COMPLETE
Area-P 1/2: 5.34 cm2
S' Lateral: 2.7 cm

## 2021-05-25 ENCOUNTER — Encounter: Payer: Self-pay | Admitting: Cardiology

## 2021-05-25 ENCOUNTER — Other Ambulatory Visit: Payer: Self-pay

## 2021-05-25 ENCOUNTER — Ambulatory Visit: Payer: Medicare Other | Admitting: Cardiology

## 2021-05-25 VITALS — BP 132/74 | HR 70 | Ht 64.0 in | Wt 195.4 lb

## 2021-05-25 DIAGNOSIS — I10 Essential (primary) hypertension: Secondary | ICD-10-CM

## 2021-05-25 DIAGNOSIS — I890 Lymphedema, not elsewhere classified: Secondary | ICD-10-CM

## 2021-05-25 NOTE — Progress Notes (Signed)
Cardiology Office Note:    Date:  05/25/2021   ID:  Patricia [name], DOB 05-09-1951, MRN [id number]  PCP:  [name], MD   [facility] Providers Cardiologist:  [name], MD     Referring MD: [name], MD    History of Present Illness:    Patricia [name] is a 70 y.o. 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  Coronary cardiac CT showed moderate plaque with no flow limitations calcium score was 348.  Aorta was 43 mm.  Added Crestor.  At prior visit in November 2021 with Cecilie Kicks, NP she was doing well without any major issues.  Had a few palpitations on rare occasions.  Fullness in the chest like GERD.  Was tolerating Crestor without any problems.  Past Medical History:  Diagnosis Date   Aneurysm of aorta ([facility])    Arthritis    Atrophic vaginitis    Cellulitis and abscess of right leg 05/07/2018   Depression    Diverticulitis    DYSPNEA 06/04/2009   Qualifier: Diagnosis of  By: Elsworth Soho MD, Leanna Sato.     Edema of both legs    She takes Lasix a couple of times per week to decrease swelling.     Fatty liver    Hyperlipidemia    Hypertension    Keratosis    hands and lower limbs   Lymphedema    legs   Memory difficulty    Obesity    Reflux    Shingles    Venous stasis    lower legs    Past Surgical History:  Procedure Laterality Date   ABDOMINAL HYSTERECTOMY     AUGMENTATION MAMMAPLASTY     Basal and Squamous cell cancers excised     CESAREAN SECTION     X 2   EYE SURGERY     bilateral cataract with lens implants   KIDNEY STONE SURGERY     removal couldnt blast   KNEE SURGERY     lipoma surgery     LIPOSUCTION     knees   TOTAL KNEE ARTHROPLASTY Left 09/26/2018   Procedure: TOTAL KNEE ARTHROPLASTY;  Surgeon: Paralee Cancel, MD;  Location: [facility];  Service: Orthopedics;  Laterality: Left;  70 mins   TOTAL KNEE  ARTHROPLASTY     left   tummy tuck      Current Medications: Current Meds  Medication Sig   ALPRAZolam (XANAX) 1 MG tablet Take 1 mg by mouth as needed.   amLODipine (NORVASC) 5 MG tablet Take 5 mg by mouth daily.   benzonatate (TESSALON) 100 MG capsule TAKE 1 CAPSULE BY MOUTH THREE TIMES A DAY AS NEEDED FOR COUGH   desvenlafaxine (PRISTIQ) 50 MG 24 hr tablet Take 50 mg by mouth daily.   esomeprazole (NEXIUM) 20 MG capsule Take 20 mg by mouth daily.    metoprolol succinate (TOPROL-XL) 50 MG 24 hr tablet TAKE ONE TABLET BY MOUTH ONE TIME DAILY WITH OR IMMEDIATLEY FOLLOWING A MEAL   polyethylene glycol (MIRALAX / GLYCOLAX) 17 g packet Take 17 g by mouth daily.   rosuvastatin (CRESTOR) 20 MG tablet Take 1 tablet (20 mg total) by mouth daily.   SYMBICORT 80-4.5 MCG/ACT inhaler as needed.   triamterene-hydrochlorothiazide (MAXZIDE-25) 37.5-25 MG per tablet Take 1 tablet by mouth daily.       Allergies:   Ciprofloxacin, Clindamycin/lincomycin,  Morphine, and Tetracyclines & related   Social History   Socioeconomic History   Marital status: Married    Spouse name: [name]   Number of children: 2   Years of education: 12   Highest education level: Not on file  Occupational History   Occupation: Music therapist: [employer]    Comment: SELF-EMPLOYED   Tobacco Use   Smoking status: Former    Packs/day: 1.00    Years: 25.00    Pack years: 25.00    Types: Cigarettes   Smokeless tobacco: Never  Vaping Use   Vaping Use: Never used  Substance and Sexual Activity   Alcohol use: Yes    Alcohol/week: 0.0 standard drinks    Comment: Rare   Drug use: No   Sexual activity: Yes    Birth control/protection: Surgical    Comment: HYST-1st intercourse 70 yo-Fewer than 5 partners  Other Topics Concern   Not on file  Social History Narrative   Marital Status: Married Engineer, technical sales)   Children:  Sons (2)    Pets: Cat    Living Situation: Lives with husband    Occupation:  Therapist, occupational - [employer]   Education: Programmer, systems    Tobacco Use/Exposure:  None    Alcohol Use:  Occasional   Drug Use:  None   Diet:  Regular   Exercise:  Bikram Yoga    Hobbies: Gardening/ Reading   Patient is right handed               Social Determinants of Radio broadcast assistant Strain: Not on file  Food Insecurity: Not on file  Transportation Needs: Not on file  Physical Activity: Not on file  Stress: Not on file  Social Connections: Not on file     Family History: The patient's family history includes Alzheimer's disease in her father; Bipolar disorder in her sister; Breast cancer in her cousin; Cancer in her paternal grandmother; Depression in her sister; Diabetes in her mother; Diverticulitis in her maternal grandmother, paternal grandfather, and paternal grandmother; Heart disease in her mother; Hyperlipidemia in her mother; Hypertension in her father and mother; Liver cancer in her paternal grandfather; Uterine cancer in her mother.  ROS:   Please see the history of present illness.     All other systems reviewed and are negative.  EKGs/Labs/Other Studies Reviewed:    The following studies were reviewed today: Cardiac CT .IMPRESSION: 1. Coronary calcium score of 348. This was 42 percentile for age and sex matched control.   2. Normal coronary origin with right dominance.   3.  There is LAD calcified plaque proximally with 0-24% stenosis.   4. There is Ramus mixed plaque proximally with 50-69% stenosis. Sending for FFR analysis. No flow limiting.   5.  There is RCA calcified plaque with 0-24% stenosis.   6. Dilated ascending aorta, 43 mm, stable from prior. Continue to monitor annually.  ECHO 05/21/21: Aortic root stable at 43 mm.  Previously described at 45 mm. Normal pump function. Repeat echocardiogram in 1 year.    EKG:  EKG is  ordered today.  The ekg ordered today demonstrates SR 70  Recent  Labs: 11/25/2020: ALT 18  Recent Lipid Panel    Component Value Date/Time   CHOL 141 11/25/2020 0954   TRIG 138 11/25/2020 0954   HDL 49 11/25/2020 0954   CHOLHDL 2.9 11/25/2020 0954   LDLCALC 68 11/25/2020 0954  Risk Assessment/Calculations:          Physical Exam:    VS:  BP 132/74   Pulse 70   Ht 5' 4"  (1.626 m)   Wt 195 lb 6.4 oz (88.6 kg)   SpO2 96%   BMI 33.54 kg/m     Wt Readings from Last 3 Encounters:  05/25/21 195 lb 6.4 oz (88.6 kg)  08/22/20 189 lb (85.7 kg)  04/28/20 176 lb (79.8 kg)     GEN:  Well nourished, well developed in no acute distress HEENT: Normal NECK: No JVD; No carotid bruits LYMPHATICS: No lymphadenopathy CARDIAC: RRR, no murmurs, rubs, gallops RESPIRATORY:  Clear to auscultation without rales, wheezing or rhonchi  ABDOMEN: Soft, non-tender, non-distended MUSCULOSKELETAL:  edema chronic edema; No deformity  SKIN: Warm and dry NEUROLOGIC:  Alert and oriented x 3 PSYCHIATRIC:  Normal affect   ASSESSMENT:    1. Lymphedema   2. Essential hypertension    PLAN:    In order of problems listed above:  Moderate coronary artery disease - Nonflow limiting on coronary CT as described above.  Continue with aggressive goal-directed medical therapy.  Calcium score in the 340 range.  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  Continue with beta-blocker, hypertension control.  Hyperlipidemia - On Crestor.  LDL 68, excellent  Palpitations - Decreased on beta-blocker.  Essential hypertension - Blood pressure controlled. BP over weekend a little high for her. 158/98. On average OK. Monitor. Meds reviewed as above no changes.   LE Edema --MD retired from [employer] that she used to see. Will refer to [facility].      Medication Adjustments/Labs and Tests Ordered: Current medicines are reviewed at length with the patient today.  Concerns regarding medicines are outlined above.  Orders Placed This Encounter  Procedures   Ambulatory  referral to Vascular Surgery   EKG 12-Lead    No orders of the defined types were placed in this encounter.   Patient Instructions  Medication Instructions:  The current medical regimen is effective;  continue present plan and medications.  *If you need a refill on your cardiac medications before your next appointment, please call your pharmacy*  You have been referred to Vascular and Vein Specialist.  You will be contacted to be scheduled.  Follow-Up: At [facility], you and your health needs are our priority.  As part of our continuing mission to provide you with exceptional heart care, we have created designated Provider Care Teams.  These Care Teams include your primary Cardiologist (physician) and Advanced Practice Providers (APPs -  Physician Assistants and Nurse Practitioners) who all work together to provide you with the care you need, when you need it.  We recommend signing up for the patient portal called "MyChart".  Sign up information is provided on this After Visit Summary.  MyChart is used to connect with patients for Virtual Visits (Telemedicine).  Patients are able to view lab/test results, encounter notes, upcoming appointments, etc.  Non-urgent messages can be sent to your provider as well.   To learn more about what you can do with MyChart, go to [email or web address].    Your next appointment:   1 year(s)  The format for your next appointment:   In Person  Provider:   [name], MD   Thank you for choosing [facility]!!     Signed, [name], MD  05/25/2021 10:27 AM    [place]

## 2021-05-25 NOTE — Patient Instructions (Signed)
Medication Instructions:  The current medical regimen is effective;  continue present plan and medications.  *If you need a refill on your cardiac medications before your next appointment, please call your pharmacy*  You have been referred to Vascular and Vein Specialist.  You will be contacted to be scheduled.  Follow-Up: At [facility], you and your health needs are our priority.  As part of our continuing mission to provide you with exceptional heart care, we have created designated Provider Care Teams.  These Care Teams include your primary Cardiologist (physician) and Advanced Practice Providers (APPs -  Physician Assistants and Nurse Practitioners) who all work together to provide you with the care you need, when you need it.  We recommend signing up for the patient portal called "MyChart".  Sign up information is provided on this After Visit Summary.  MyChart is used to connect with patients for Virtual Visits (Telemedicine).  Patients are able to view lab/test results, encounter notes, upcoming appointments, etc.  Non-urgent messages can be sent to your provider as well.   To learn more about what you can do with MyChart, go to [email or web address].    Your next appointment:   1 year(s)  The format for your next appointment:   In Person  Provider:   [name], MD   Thank you for choosing [facility]!!

## 2021-06-02 ENCOUNTER — Encounter: Payer: Self-pay | Admitting: Emergency Medicine

## 2021-06-02 ENCOUNTER — Ambulatory Visit (INDEPENDENT_AMBULATORY_CARE_PROVIDER_SITE_OTHER): Payer: Medicare Other

## 2021-06-02 ENCOUNTER — Ambulatory Visit: Payer: Medicare Other | Admitting: Emergency Medicine

## 2021-06-02 ENCOUNTER — Other Ambulatory Visit: Payer: Self-pay

## 2021-06-02 DIAGNOSIS — R053 Chronic cough: Secondary | ICD-10-CM

## 2021-06-02 MED ORDER — PANTOPRAZOLE SODIUM 40 MG PO TBEC: DELAYED_RELEASE_TABLET | ORAL | 1 refills | Status: DC

## 2021-06-02 MED ORDER — FLUTICASONE PROPIONATE 50 MCG/ACT NA SUSP: 2.0000 | Freq: Every day | NASAL | 2 refills | Status: DC

## 2021-06-02 MED ORDER — LORATADINE 10 MG PO TABS: 10.0000 mg | ORAL_TABLET | Freq: Every day | ORAL | 5 refills | Status: DC

## 2021-06-02 NOTE — Patient Instructions (Signed)
Please stop your esomeprazole (Nexium) for now Please start pantoprazole 40 mg twice a day until your next visit.  Take this medication 1 hour around food. Please start loratadine 10 mg once daily until next visit Please start fluticasone nasal spray, 2 sprays each nostril once daily until next visit. You can continue to use your saline nasal spray to help with congestion Chest x-ray today We will perform pulmonary function testing at your next office visit. Follow Dr. Lamonte Sakai next available with PFT on the same day.

## 2021-06-02 NOTE — Assessment & Plan Note (Addendum)
She has upper airway irritation symptoms with a globus sensation, cough that can be paroxysmal.  Did not really respond to low-dose PPI.  She does have some clear mucus but does not feel any throat drainage.  Consider possible component of lower airways obstruction and asthma/COPD.  Plan to perform pulmonary function testing to determine degree of obstruction.  In the meantime will increase her PPI, start loratadine, fluticasone nasal spray to see if she gets benefit.  Chest x-ray today.  Consider eosinophil, IgE going forward.  May need to do airway inspection depending on course.  Please stop your esomeprazole (Nexium) for now Please start pantoprazole 40 mg twice a day until your next visit.  Take this medication 1 hour around food. Please start loratadine 10 mg once daily until next visit Please start fluticasone nasal spray, 2 sprays each nostril once daily until next visit. You can continue to use your saline nasal spray to help with congestion Chest x-ray today We will perform pulmonary function testing at your next office visit. Follow Dr. Lamonte Sakai next available with PFT on the same day.

## 2021-06-02 NOTE — Progress Notes (Signed)
Subjective:    Patient ID: Patricia [name], female    DOB: 13-Feb-1951, 70 y.o.   MRN: [id number]  HPI 70 year old former smoker (20 pack years) with a history of obesity, hypertension, hyperlipidemia, depression, lymphedema.  She is referred today for evaluation of cough. She has been experiencing cough for years. This has been worse - more frequent and a bit harder. Unclear triggers - not associated with meals, happens at random. May be associated with talking. No real hoarseness. She had COVID in 03/2020.   She had Symbicort but not using. Uses albuterol rarely, only uses prn for coughing spells.  She is on PPI, OTC esomeprazole. She has been treated with prednisone before. She has nasal congestion, uses saline spray, wakes up with this in the am. Clear mucous. Doesn't feel any throat drainage. Can sometimes have a globus sensation in the am. Can sometimes have some dysphagia. Can sometimes be associated with anxiety or "being uncomfortable"  Coronary CT chest 05/19/2020 reviewed by me shows no evidence interstitial disease or other pulmonary abnormality.  She has 4.3 cm ascending thoracic aortic aneurysm  CT angio chest 03/21/2019 reviewed by me shows her TAA, mild bibasilar atelectasis some scattered pulmonary nodules 1-2 mm.  No effusion or infiltrate.  Small hiatal hernia  Spirometry 04/2009 reviewed, normal airflows without BD response.    Review of Systems As per HPI  Past Medical History:  Diagnosis Date   Aneurysm of aorta ([place])    Arthritis    Atrophic vaginitis    Cellulitis and abscess of right leg 05/07/2018   Depression    Diverticulitis    DYSPNEA 06/04/2009   Qualifier: Diagnosis of  By: Elsworth Soho MD, Leanna Sato.     Edema of both legs    She takes Lasix a couple of times per week to decrease swelling.     Fatty liver    Hyperlipidemia    Hypertension    Keratosis    hands and lower limbs   Lymphedema    legs   Memory difficulty    Obesity    Reflux    Shingles     Venous stasis    lower legs     Family History  Problem Relation Age of Onset   Uterine cancer Mother    Heart disease Mother    Diabetes Mother    Hypertension Mother    Hyperlipidemia Mother    Alzheimer's disease Father    Hypertension Father    Cancer Paternal Grandmother        Gallbladder cancer   Diverticulitis Paternal Grandmother        gallbladder   Liver cancer Paternal Grandfather    Diverticulitis Paternal Grandfather    Diverticulitis Maternal Grandmother    Breast cancer Cousin        Paternal 1st cousin Age 78   Depression Sister    Bipolar disorder Sister      Social History   Socioeconomic History   Marital status: Married    Spouse name: [name]   Number of children: 2   Years of education: 12   Highest education level: Not on file  Occupational History   Occupation: Music therapist: [employer]    Comment: SELF-EMPLOYED   Tobacco Use   Smoking status: Former    Packs/day: 1.00    Years: 25.00    Pack years: 25.00    Types: Cigarettes   Smokeless tobacco: Never  Vaping Use   Vaping  Use: Never used  Substance and Sexual Activity   Alcohol use: Yes    Alcohol/week: 0.0 standard drinks    Comment: Rare   Drug use: No   Sexual activity: Yes    Birth control/protection: Surgical    Comment: HYST-1st intercourse 70 yo-Fewer than 5 partners  Other Topics Concern   Not on file  Social History Narrative   Marital Status: Married Engineer, technical sales)   Children:  Sons (2)    Pets: Cat    Living Situation: Lives with husband    Occupation: Therapist, occupational - [employer]   Education: Programmer, systems    Tobacco Use/Exposure:  None    Alcohol Use:  Occasional   Drug Use:  None   Diet:  Regular   Exercise:  Bikram Yoga    Hobbies: Gardening/ Reading   Patient is right handed               Social Determinants of Radio broadcast assistant Strain: Not on file  Food Insecurity: Not on file   Transportation Needs: Not on file  Physical Activity: Not on file  Stress: Not on file  Social Connections: Not on file  Intimate Partner Violence: Not on file    Office work [place] Owns cats. Had a bird many years ago.  No mold exposure to her knowledge   Allergies  Allergen Reactions   Ciprofloxacin Other (See Comments)    Pt has a descending heart aneurism and has been instructed not to take Cipro.    Clindamycin/Lincomycin Other (See Comments)    Stomach pain   Morphine     REACTION: itch   Tetracyclines & Related Other (See Comments)    Stomach pain     Outpatient Medications Prior to Visit  Medication Sig Dispense Refill   ALPRAZolam (XANAX) 1 MG tablet Take 1 mg by mouth as needed.     amLODipine (NORVASC) 5 MG tablet Take 5 mg by mouth daily.     benzonatate (TESSALON) 100 MG capsule TAKE 1 CAPSULE BY MOUTH THREE TIMES A DAY AS NEEDED FOR COUGH     desvenlafaxine (PRISTIQ) 50 MG 24 hr tablet Take 50 mg by mouth daily.     esomeprazole (NEXIUM) 20 MG capsule Take 20 mg by mouth daily.      metoprolol succinate (TOPROL-XL) 50 MG 24 hr tablet TAKE ONE TABLET BY MOUTH ONE TIME DAILY WITH OR IMMEDIATLEY FOLLOWING A MEAL 90 tablet 3   polyethylene glycol (MIRALAX / GLYCOLAX) 17 g packet Take 17 g by mouth daily.     rosuvastatin (CRESTOR) 20 MG tablet Take 1 tablet (20 mg total) by mouth daily. 90 tablet 3   SYMBICORT 80-4.5 MCG/ACT inhaler as needed.     triamterene-hydrochlorothiazide (MAXZIDE-25) 37.5-25 MG per tablet Take 1 tablet by mouth daily.       No facility-administered medications prior to visit.         Objective:   Physical Exam Vitals:   06/02/21 1333  BP: 124/72  Pulse: 70  SpO2: 97%  Weight: 192 lb 3.2 oz (87.2 kg)  Height: 5' 4"  (1.626 m)    Gen: Pleasant, well-nourished, in no distress,  normal affect  ENT: No lesions,  mouth clear,  oropharynx clear, no postnasal drip  Neck: No JVD, no stridor  Lungs: No use of accessory muscles,  no crackles or wheezing on normal respiration, no wheeze on forced expiration  Cardiovascular: RRR, heart sounds normal, no murmur or gallops,  no peripheral edema  Musculoskeletal: No deformities, no cyanosis or clubbing  Neuro: alert, awake, non focal  Skin: Warm, no lesions or rash      Assessment & Plan:  Chronic cough She has upper airway irritation symptoms with a globus sensation, cough that can be paroxysmal.  Did not really respond to low-dose PPI.  She does have some clear mucus but does not feel any throat drainage.  Consider possible component of lower airways obstruction and asthma/COPD.  Plan to perform pulmonary function testing to determine degree of obstruction.  In the meantime will increase her PPI, start loratadine, fluticasone nasal spray to see if she gets benefit.  Chest x-ray today.  Consider eosinophil, IgE going forward.  May need to do airway inspection depending on course.  Please stop your esomeprazole (Nexium) for now Please start pantoprazole 40 mg twice a day until your next visit.  Take this medication 1 hour around food. Please start loratadine 10 mg once daily until next visit Please start fluticasone nasal spray, 2 sprays each nostril once daily until next visit. You can continue to use your saline nasal spray to help with congestion Chest x-ray today We will perform pulmonary function testing at your next office visit. Follow Dr. Lamonte Sakai next available with PFT on the same day.  Baltazar Apo, MD, PhD 06/02/2021, 5:17 PM [facility] [phone] or if no answer before 7:00PM call [phone] For any issues after 7:00PM please call eLink [phone]

## 2021-06-06 ENCOUNTER — Other Ambulatory Visit: Payer: Self-pay

## 2021-06-06 ENCOUNTER — Emergency Department
Admission: EM | Admit: 2021-06-06 | Discharge: 2021-06-06 | Disposition: A | Discharge status: Home / Self Care | Payer: Medicare Other | Attending: Emergency Medicine | Admitting: Emergency Medicine

## 2021-06-06 DIAGNOSIS — Z23 Encounter for immunization: Secondary | ICD-10-CM | POA: Diagnosis not present

## 2021-06-06 DIAGNOSIS — Z96652 Presence of left artificial knee joint: Secondary | ICD-10-CM | POA: Insufficient documentation

## 2021-06-06 DIAGNOSIS — I10 Essential (primary) hypertension: Secondary | ICD-10-CM | POA: Diagnosis not present

## 2021-06-06 DIAGNOSIS — Y93H2 Activity, gardening and landscaping: Secondary | ICD-10-CM | POA: Insufficient documentation

## 2021-06-06 DIAGNOSIS — W5501XA Bitten by cat, initial encounter: Secondary | ICD-10-CM | POA: Insufficient documentation

## 2021-06-06 DIAGNOSIS — Y92017 Garden or yard in single-family (private) house as the place of occurrence of the external cause: Secondary | ICD-10-CM | POA: Insufficient documentation

## 2021-06-06 DIAGNOSIS — Z79899 Other long term (current) drug therapy: Secondary | ICD-10-CM | POA: Insufficient documentation

## 2021-06-06 DIAGNOSIS — Z87891 Personal history of nicotine dependence: Secondary | ICD-10-CM | POA: Diagnosis not present

## 2021-06-06 DIAGNOSIS — S51851A Open bite of right forearm, initial encounter: Secondary | ICD-10-CM | POA: Diagnosis not present

## 2021-06-06 MED ORDER — AMOXICILLIN-POT CLAVULANATE 875-125 MG PO TABS: 1.0000 | ORAL_TABLET | Freq: Two times a day (BID) | ORAL | 0 refills | Status: AC

## 2021-06-06 MED ORDER — TETANUS-DIPHTH-ACELL PERTUSSIS 5-2.5-18.5 LF-MCG/0.5 IM SUSY
0.5000 mL | PREFILLED_SYRINGE | Freq: Once | INTRAMUSCULAR | Status: AC
  Administered 2021-06-06: 0.5 mL via INTRAMUSCULAR
  Filled 2021-06-06: qty 0.5

## 2021-06-06 MED ORDER — AMOXICILLIN-POT CLAVULANATE 875-125 MG PO TABS
1.0000 | ORAL_TABLET | Freq: Once | ORAL | Status: AC
  Administered 2021-06-06: 1 via ORAL
  Filled 2021-06-06: qty 1

## 2021-06-06 NOTE — ED Triage Notes (Signed)
Pt states she was gardening and cat came out of woods and bit her on the right hand breaking the skin. Bleeding is controlled with gauze pad at this time. Pt states the animal ran off into the woods again. Pt has not contacted animal control.

## 2021-06-06 NOTE — Discharge Instructions (Addendum)
You were seen today for a cat bite to your right forearm.  You received a tetanus injection today.  We cleansed the area and gave you a dose of Augmentin in the ER.  I have given you prescription for Augmentin 875-125 mg twice daily for the next 10 days to prevent infection.  We did not give you rabies and ejection at this time.  Please follow-up with your PCP as needed.

## 2021-06-06 NOTE — ED Provider Notes (Signed)
[facility] Emergency Department Provider Note ____________________________________________  Time seen: 2040  I have reviewed the triage vital signs and the nursing notes.  HISTORY  Chief Complaint  Animal Bite   HPI Patricia [name] is a 70 y.o. female presents to the ER today with complaint of a cat bite to her right forearm.  She reports this occurred earlier today while she was out in the yard gardening.  She reports the cat came up to her out of the woods, was friendly at first and she was petting it and then all of a sudden it bit her on the right forearm.  She does not know whose cat this is, vaccination status.  She was not able to catch it but did not call animal services.  She denies any pain at this time.  She cleansed the area with antiseptic soap and alcohol.  She is unsure the last time she had a tetanus vaccine but thinks it may have been within the last 10 years.  Her last tetanus was in 2016 for epic.  Past Medical History:  Diagnosis Date   Aneurysm of aorta ([place])    Arthritis    Atrophic vaginitis    Cellulitis and abscess of right leg 05/07/2018   Depression    Diverticulitis    DYSPNEA 06/04/2009   Qualifier: Diagnosis of  By: Elsworth Soho MD, Leanna Sato.     Edema of both legs    She takes Lasix a couple of times per week to decrease swelling.     Fatty liver    Hyperlipidemia    Hypertension    Keratosis    hands and lower limbs   Lymphedema    legs   Memory difficulty    Obesity    Reflux    Shingles    Venous stasis    lower legs    Patient Active Problem List   Diagnosis Date Noted   Chronic cough 06/02/2021   Allergic rhinitis 05/15/2019   Amnesia 05/15/2019   Bunion 05/15/2019   Colon, diverticulosis 05/15/2019   NASH (nonalcoholic steatohepatitis) 05/15/2019   Varicose veins of other specified sites 05/15/2019   Pain in thumb joint with movement of left hand 03/29/2019   Pain in thumb joint with movement, right 03/29/2019    S/P left TKA 09/26/2018   Osteoarthritis of left knee 09/06/2018   Fatigue 08/07/2018   Need for influenza vaccination 08/07/2018   Venous stasis syndrome 07/18/2018   Lymphedema of right lower extremity 05/29/2018   Cellulitis 05/07/2018   Cellulitis and abscess of right leg 05/07/2018   Pain in left knee 12/09/2017   Lipoma 10/29/2014   Encounter for consultation 11/06/2013   Essential hypertension, benign 02/26/2013   Obesity, unspecified 02/26/2013   Aneurysm of aorta ([place])    Depression    Fatty liver    Reflux    Atrophic vaginitis    DYSPNEA 06/04/2009   HYPERLIPIDEMIA 06/03/2009   HYPERTENSION 06/03/2009   MITRAL VALVE PROLAPSE 06/03/2009   Esophageal reflux 06/03/2009    Past Surgical History:  Procedure Laterality Date   ABDOMINAL HYSTERECTOMY     AUGMENTATION MAMMAPLASTY     Basal and Squamous cell cancers excised     CESAREAN SECTION     X 2   EYE SURGERY     bilateral cataract with lens implants   KIDNEY STONE SURGERY     removal couldnt blast   KNEE SURGERY     lipoma surgery  LIPOSUCTION     knees   TOTAL KNEE ARTHROPLASTY Left 09/26/2018   Procedure: TOTAL KNEE ARTHROPLASTY;  Surgeon: Paralee Cancel, MD;  Location: [facility];  Service: Orthopedics;  Laterality: Left;  70 mins   TOTAL KNEE ARTHROPLASTY     left   tummy tuck      Prior to Admission medications   Medication Sig Start Date End Date Taking? Authorizing Provider  amoxicillin-clavulanate (AUGMENTIN) 875-125 MG tablet Take 1 tablet by mouth 2 (two) times daily for 7 days. 06/06/21 06/13/21 Yes Kasen Sako, Coralie Keens, NP  ALPRAZolam Duanne Moron) 1 MG tablet Take 1 mg by mouth as needed. 02/23/21   [provider]  amLODipine (NORVASC) 5 MG tablet Take 5 mg by mouth daily.    [provider]  benzonatate (TESSALON) 100 MG capsule TAKE 1 CAPSULE BY MOUTH THREE TIMES A DAY AS NEEDED FOR COUGH 04/25/19   [provider]  desvenlafaxine (PRISTIQ) 50 MG 24 hr tablet Take 50 mg by mouth  daily.    [provider]  esomeprazole (NEXIUM) 20 MG capsule Take 20 mg by mouth daily.     [provider]  fluticasone (FLONASE) 50 MCG/ACT nasal spray Place 2 sprays into both nostrils daily. 06/02/21   Collene Gobble, MD  loratadine (CLARITIN) 10 MG tablet Take 1 tablet (10 mg total) by mouth daily. 06/02/21   Collene Gobble, MD  metoprolol succinate (TOPROL-XL) 50 MG 24 hr tablet TAKE ONE TABLET BY MOUTH ONE TIME DAILY WITH OR IMMEDIATLEY FOLLOWING A MEAL 05/20/20   Jerline Pain, MD  pantoprazole (PROTONIX) 40 MG tablet Take 1 tablet twice daily until next OV. Take 1 hour before eating. 06/02/21   Collene Gobble, MD  polyethylene glycol (MIRALAX / GLYCOLAX) 17 g packet Take 17 g by mouth daily.    [provider]  rosuvastatin (CRESTOR) 20 MG tablet Take 1 tablet (20 mg total) by mouth daily. 08/27/20   Jerline Pain, MD  SYMBICORT 80-4.5 MCG/ACT inhaler as needed. 09/18/19   [provider]  triamterene-hydrochlorothiazide (MAXZIDE-25) 37.5-25 MG per tablet Take 1 tablet by mouth daily.      [provider]    Allergies Ciprofloxacin, Clindamycin/lincomycin, Morphine, and Tetracyclines & related  Family History  Problem Relation Age of Onset   Uterine cancer Mother    Heart disease Mother    Diabetes Mother    Hypertension Mother    Hyperlipidemia Mother    Alzheimer's disease Father    Hypertension Father    Cancer Paternal Grandmother        Gallbladder cancer   Diverticulitis Paternal Grandmother        gallbladder   Liver cancer Paternal Grandfather    Diverticulitis Paternal Grandfather    Diverticulitis Maternal Grandmother    Breast cancer Cousin        Paternal 1st cousin Age 24   Depression Sister    Bipolar disorder Sister     Social History Social History   Tobacco Use   Smoking status: Former    Packs/day: 1.00    Years: 25.00    Pack years: 25.00    Types: Cigarettes   Smokeless tobacco: Never  Vaping Use    Vaping Use: Never used  Substance Use Topics   Alcohol use: Yes    Alcohol/week: 0.0 standard drinks    Comment: Rare   Drug use: No    Review of Systems  Constitutional: Negative for fever, chills or body aches. Cardiovascular:  Negative for chest pain or chest tightness. Respiratory: Negative for cough, shortness of breath. Musculoskeletal: Negative for joint pain or swelling.. Skin: Positive for puncture wound of right forearm. Neurological: Negative for focal weakness, tingling or numbness. ____________________________________________  PHYSICAL EXAM:  VITAL SIGNS: ED Triage Vitals  Enc Vitals Group     BP 06/06/21 1721 126/73     Pulse Rate 06/06/21 1721 81     Resp 06/06/21 1721 16     Temp 06/06/21 1721 98.3 F (36.8 C)     Temp Source 06/06/21 1721 Oral     SpO2 06/06/21 1721 98 %     Weight 06/06/21 1720 190 lb (86.2 kg)     Height 06/06/21 1720 5' 4"  (1.626 m)     Head Circumference --      Peak Flow --      Pain Score 06/06/21 1720 2     Pain Loc --      Pain Edu? --      Excl. in [place]? --     Constitutional: Alert and oriented. Well appearing and in no distress. Head: Normocephalic. Cardiovascular: Normal rate, regular rhythm.  Murmur noted.  Radial pulses 2+ on the right. Respiratory: Normal respiratory effort. No wheezes/rales/rhonchi. Musculoskeletal: No palpation with the radius or ulna of the right hand. Neurologic:  Normal speech and language. No gross focal neurologic deficits are appreciated. Skin: Puncture wound noted over the lateral right mid forearm.  2 puncture wounds noted over the anterior aspect of the mid right forearm. ____________________________________________  INITIAL IMPRESSION / ASSESSMENT AND PLAN / ED COURSE  Cat Bite of Right Forearm:  No indication for x-ray as wounds are superficial Tdap today Wounds clean with hydrogen peroxide Augmentin 875-125 mg p.o. x1 in ER Discussed rabies vaccination, she has had this in the  past, she would like to hold off at this time ____________________________________________  FINAL CLINICAL IMPRESSION(S) / ED DIAGNOSES  Final diagnoses:  Cat bite of right forearm, initial encounter      Jearld Fenton, NP 06/06/21 2105    Nance Pear, MD 06/06/21 2107

## 2021-06-17 DIAGNOSIS — M545 Low back pain, unspecified: Secondary | ICD-10-CM | POA: Diagnosis not present

## 2021-07-03 ENCOUNTER — Other Ambulatory Visit: Payer: Self-pay

## 2021-07-03 DIAGNOSIS — I890 Lymphedema, not elsewhere classified: Secondary | ICD-10-CM

## 2021-07-14 ENCOUNTER — Ambulatory Visit (HOSPITAL_COMMUNITY)
Admission: RE | Admit: 2021-07-14 | Discharge: 2021-07-14 | Disposition: A | Discharge status: Home / Self Care | Payer: Medicare Other | Source: Ambulatory Visit | Attending: Vascular Surgery | Admitting: Vascular Surgery

## 2021-07-14 ENCOUNTER — Ambulatory Visit: Payer: Medicare Other | Admitting: Vascular Surgery

## 2021-07-14 ENCOUNTER — Other Ambulatory Visit: Payer: Self-pay

## 2021-07-14 DIAGNOSIS — I872 Venous insufficiency (chronic) (peripheral): Secondary | ICD-10-CM | POA: Diagnosis not present

## 2021-07-14 DIAGNOSIS — I890 Lymphedema, not elsewhere classified: Secondary | ICD-10-CM

## 2021-07-14 NOTE — Progress Notes (Signed)
Patient name: Patricia [name] MRN: [id number] DOB: [birth year] Sex: female  REASON FOR VISIT: Evaluate lower extremity venous insufficiency  HPI: Patricia [name] is a 70 y.o. female that presents for evaluation of lower extremity venous insufficiency.  She states she has had marked swelling in both lower extremities for the last 3 years.  She feels the left leg is much worse than the right.  She denies any history of DVT.  Apparently about 3 years ago she was diagnosed with lymphedema and was given lymphedema pumps by Dr. Zigmund Daniel at [facility] in the wound clinic.  Sound like she had a dog bite on the right lower extremity that has since healed at the time.  She has been intermittently wearing her lymphedema pumps.  When she does travel and not using her pumpes she does wear compression wraps.  She has been sized for compression stockings in the past.  She states she was told to come be evaluated because it was felt she may have a component of underlying venous insufficiency.  Past Medical History:  Diagnosis Date   Aneurysm of aorta ([place])    Arthritis    Atrophic vaginitis    Cellulitis and abscess of right leg 05/07/2018   Depression    Diverticulitis    DYSPNEA 06/04/2009   Qualifier: Diagnosis of  By: Elsworth Soho MD, Leanna Sato.     Edema of both legs    She takes Lasix a couple of times per week to decrease swelling.     Fatty liver    Hyperlipidemia    Hypertension    Keratosis    hands and lower limbs   Lymphedema    legs   Memory difficulty    Obesity    Reflux    Shingles    Venous stasis    lower legs    Past Surgical History:  Procedure Laterality Date   ABDOMINAL HYSTERECTOMY     AUGMENTATION MAMMAPLASTY     Basal and Squamous cell cancers excised     CESAREAN SECTION     X 2   EYE SURGERY     bilateral cataract with lens implants   KIDNEY STONE SURGERY     removal couldnt blast   KNEE SURGERY     lipoma surgery     LIPOSUCTION     knees   TOTAL KNEE  ARTHROPLASTY Left 09/26/2018   Procedure: TOTAL KNEE ARTHROPLASTY;  Surgeon: Paralee Cancel, MD;  Location: [facility];  Service: Orthopedics;  Laterality: Left;  70 mins   TOTAL KNEE ARTHROPLASTY     left   tummy tuck      Family History  Problem Relation Age of Onset   Uterine cancer Mother    Heart disease Mother    Diabetes Mother    Hypertension Mother    Hyperlipidemia Mother    Alzheimer's disease Father    Hypertension Father    Cancer Paternal Grandmother        Gallbladder cancer   Diverticulitis Paternal Grandmother        gallbladder   Liver cancer Paternal Grandfather    Diverticulitis Paternal Grandfather    Diverticulitis Maternal Grandmother    Breast cancer Cousin        Paternal 1st cousin Age 37   Depression Sister    Bipolar disorder Sister     SOCIAL HISTORY: Social History   Tobacco Use   Smoking status: Former    Packs/day: 1.00    Years: 25.00  Pack years: 25.00    Types: Cigarettes   Smokeless tobacco: Never  Substance Use Topics   Alcohol use: Yes    Alcohol/week: 0.0 standard drinks    Comment: Rare    Allergies  Allergen Reactions   Ciprofloxacin Other (See Comments)    Pt has a descending heart aneurism and has been instructed not to take Cipro.    Clindamycin/Lincomycin Other (See Comments)    Stomach pain   Morphine     REACTION: itch   Tetracyclines & Related Other (See Comments)    Stomach pain    Current Outpatient Medications  Medication Sig Dispense Refill   ALPRAZolam (XANAX) 1 MG tablet Take 1 mg by mouth as needed.     amLODipine (NORVASC) 5 MG tablet Take 5 mg by mouth daily.     desvenlafaxine (PRISTIQ) 50 MG 24 hr tablet Take 50 mg by mouth daily.     fluticasone (FLONASE) 50 MCG/ACT nasal spray Place 2 sprays into both nostrils daily. 16 g 2   loratadine (CLARITIN) 10 MG tablet Take 1 tablet (10 mg total) by mouth daily. 30 tablet 5   metoprolol succinate (TOPROL-XL) 50 MG 24 hr tablet TAKE ONE TABLET BY MOUTH ONE  TIME DAILY WITH OR IMMEDIATLEY FOLLOWING A MEAL 90 tablet 3   pantoprazole (PROTONIX) 40 MG tablet Take 1 tablet twice daily until next OV. Take 1 hour before eating. 60 tablet 1   polyethylene glycol (MIRALAX / GLYCOLAX) 17 g packet Take 17 g by mouth daily.     rosuvastatin (CRESTOR) 20 MG tablet Take 1 tablet (20 mg total) by mouth daily. 90 tablet 3   triamterene-hydrochlorothiazide (MAXZIDE-25) 37.5-25 MG per tablet Take 1 tablet by mouth daily.       benzonatate (TESSALON) 100 MG capsule TAKE 1 CAPSULE BY MOUTH THREE TIMES A DAY AS NEEDED FOR COUGH (Patient not taking: Reported on 07/14/2021)     esomeprazole (NEXIUM) 20 MG capsule Take 20 mg by mouth daily.  (Patient not taking: Reported on 07/14/2021)     SYMBICORT 80-4.5 MCG/ACT inhaler as needed. (Patient not taking: Reported on 07/14/2021)     No current facility-administered medications for this visit.    REVIEW OF SYSTEMS:  [X]  denotes positive finding, [ ]  denotes negative finding Cardiac  Comments:  Chest pain or chest pressure:    Shortness of breath upon exertion:    Short of breath when lying flat:    Irregular heart rhythm:        Vascular    Pain in calf, thigh, or hip brought on by ambulation:    Pain in feet at night that wakes you up from your sleep:     Blood clot in your veins:    Leg swelling:  x       Pulmonary    Oxygen at home:    Productive cough:     Wheezing:         Neurologic    Sudden weakness in arms or legs:     Sudden numbness in arms or legs:     Sudden onset of difficulty speaking or slurred speech:    Temporary loss of vision in one eye:     Problems with dizziness:         Gastrointestinal    Blood in stool:     Vomited blood:         Genitourinary    Burning when urinating:     Blood in urine:  Psychiatric    Major depression:         Hematologic    Bleeding problems:    Problems with blood clotting too easily:        Skin    Rashes or ulcers:        Constitutional     Fever or chills:      PHYSICAL EXAM: Vitals:   07/14/21 1538  BP: 139/72  Pulse: 98  Resp: 14  Temp: 98.3 F (36.8 C)  TempSrc: Temporal  SpO2: 95%  Weight: 192 lb (87.1 kg)  Height: 5' 4"  (1.626 m)    GENERAL: The patient is a well-nourished female, in no acute distress. The vital signs are documented above. CARDIAC: There is a regular rate and rhythm.  VASCULAR:  Bilateral femoral pulses palpable Bilateral DP pulses palpable Left lower extremity does have more marked edema compared to the left with some skin pigmentation  Stemmer sign negative left foot PULMONARY: There is good air exchange bilaterally without wheezing or rales. ABDOMEN: Soft and non-tender. MUSCULOSKELETAL: There are no major deformities or cyanosis. NEUROLOGIC: No focal weakness or paresthesias are detected. SKIN: There are no ulcers or rashes noted. PSYCHIATRIC: The patient has a normal affect.  DATA:   Indications: Lymphedema.     Performing Technologist: Ralene Cork RVT      Examination Guidelines: A complete evaluation includes B-mode imaging,  spectral  Doppler, color Doppler, and power Doppler as needed of all accessible  portions  of each vessel. Bilateral testing is considered an integral part of a  complete  examination. Limited examinations for reoccurring indications may be  performed  as noted. The reflux portion of the exam is performed with the patient in  reverse Trendelenburg.  Significant venous reflux is defined as >500 ms in the superficial venous  system, and >1 second in the deep venous system.      +--------------+---------+------+-----------+------------+--------+  LEFT          Reflux NoRefluxReflux TimeDiameter cmsComments                          Yes                                   +--------------+---------+------+-----------+------------+--------+  CFV           no                                               +--------------+---------+------+-----------+------------+--------+  FV mid        no                                              +--------------+---------+------+-----------+------------+--------+  Popliteal     no                                              +--------------+---------+------+-----------+------------+--------+  GSV at SFJ              yes    >500 ms  0.646              +--------------+---------+------+-----------+------------+--------+  GSV prox thighno                           0.643              +--------------+---------+------+-----------+------------+--------+  GSV mid thigh no                           0.596              +--------------+---------+------+-----------+------------+--------+  GSV dist thigh          yes    >500 ms     0.548              +--------------+---------+------+-----------+------------+--------+  GSV at knee             yes    >500 ms      0.56              +--------------+---------+------+-----------+------------+--------+  GSV prox calf no                           0.459              +--------------+---------+------+-----------+------------+--------+  SSV Pop Fossa no                            0.69              +--------------+---------+------+-----------+------------+--------+  SSV prox calf no                           0.349              +--------------+---------+------+-----------+------------+--------+  SSV mid calf  no                           0.396              +--------------+---------+------+-----------+------------+--------+       Summary:  Left:  - No evidence of deep vein thrombosis seen in the left lower extremity,  from the common femoral through the popliteal veins.  - No evidence of superficial venous thrombosis in the left lower  extremity.  - No evidence of deep vein reflux.  - Superficial vein reflux in the SFJ and GSV as above.       *See table(s) above for measurements and observations.   Electronically signed by Monica Martinez MD on 07/14/2021 at 16:48:13  Assessment/Plan:  70 year old female with a history of lymphedema per report that presents for evaluation of possible underlying venous insufficiency.  She does have a negative stemmer sign in the left foot and most of her edema is above the ankle more consistent with venous insufficiency.  CEAP classification C3.  She does have reflux in the great saphenous vein including at the [facility] and it is quite large up to 5 to 6 mm.  Discussed that she may ultimately be a candidate for laser ablation of her great saphenous vein in the left leg.  Certainly if this is more related to underlying lymphedema this may not help her as I discussed today.  I have recommended conservative measures with leg elevation and compression as well as weight loss and exercise.  I will  bring her back in 3 months to be evaluated to see if she would be a candidate for laser ablation.   Marty Heck, MD [facility] Office: [phone]

## 2021-07-16 ENCOUNTER — Ambulatory Visit: Payer: Medicare Other | Admitting: Emergency Medicine

## 2021-07-17 ENCOUNTER — Ambulatory Visit: Payer: Medicare Other | Admitting: Emergency Medicine

## 2021-07-20 DIAGNOSIS — H5213 Myopia, bilateral: Secondary | ICD-10-CM | POA: Diagnosis not present

## 2021-07-21 ENCOUNTER — Ambulatory Visit (INDEPENDENT_AMBULATORY_CARE_PROVIDER_SITE_OTHER): Payer: Medicare Other | Admitting: Emergency Medicine

## 2021-07-21 ENCOUNTER — Other Ambulatory Visit: Payer: Self-pay

## 2021-07-21 ENCOUNTER — Encounter: Payer: Self-pay | Admitting: Emergency Medicine

## 2021-07-21 ENCOUNTER — Ambulatory Visit: Payer: Medicare Other | Admitting: Emergency Medicine

## 2021-07-21 DIAGNOSIS — R053 Chronic cough: Secondary | ICD-10-CM

## 2021-07-21 LAB — PULMONARY FUNCTION TEST
DL/VA % pred: 96 %
DL/VA: 3.96 ml/min/mmHg/L
DLCO cor % pred: 91 %
DLCO cor: 18.41 ml/min/mmHg
DLCO unc % pred: 91 %
DLCO unc: 18.41 ml/min/mmHg
FEF 25-75 Post: 2.1 L/sec
FEF 25-75 Pre: 2.36 L/sec
FEF2575-%Change-Post: -10 %
FEF2575-%Pred-Post: 110 %
FEF2575-%Pred-Pre: 123 %
FEV1-%Change-Post: 0 %
FEV1-%Pred-Post: 97 %
FEV1-%Pred-Pre: 97 %
FEV1-Post: 2.28 L
FEV1-Pre: 2.28 L
FEV1FVC-%Change-Post: 1 %
FEV1FVC-%Pred-Pre: 106 %
FEV6-%Change-Post: 0 %
FEV6-%Pred-Post: 94 %
FEV6-%Pred-Pre: 95 %
FEV6-Post: 2.78 L
FEV6-Pre: 2.81 L
FEV6FVC-%Change-Post: 0 %
FEV6FVC-%Pred-Post: 104 %
FEV6FVC-%Pred-Pre: 104 %
FVC-%Change-Post: -1 %
FVC-%Pred-Post: 90 %
FVC-%Pred-Pre: 91 %
FVC-Post: 2.79 L
FVC-Pre: 2.82 L
Post FEV1/FVC ratio: 82 %
Post FEV6/FVC ratio: 100 %
Pre FEV1/FVC ratio: 81 %
Pre FEV6/FVC Ratio: 100 %

## 2021-07-21 NOTE — Progress Notes (Signed)
PFT done today. 

## 2021-07-21 NOTE — Assessment & Plan Note (Signed)
With contributions of both GERD and chronic rhinitis.  I treated both more aggressively now her cough is better.  We will try peeling off some of these medications to see if she can tolerate.  Start with the pantoprazole twice daily, changed to daily.  Her pulmonary function testing was reassuring without any evidence of obstruction.  Hold off on bronchodilator therapy.  She did have some mild interstitial changes on CT scan of the chest from a couple years ago.  Basilar atelectasis on her most recent chest x-ray.  Consider following serial chest x-rays to ensure no progression going forward.

## 2021-07-21 NOTE — Progress Notes (Signed)
Subjective:    Patient ID: Patricia [name], female    DOB: 07-27-1951, 70 y.o.   MRN: [id number]  HPI 70 year old former smoker (20 pack years) with a history of obesity, hypertension, hyperlipidemia, depression, lymphedema.  She is referred today for evaluation of cough. She has been experiencing cough for years. This has been worse - more frequent and a bit harder. Unclear triggers - not associated with meals, happens at random. May be associated with talking. No real hoarseness. She had COVID in 03/2020.   She had Symbicort but not using. Uses albuterol rarely, only uses prn for coughing spells.  She is on PPI, OTC esomeprazole. She has been treated with prednisone before. She has nasal congestion, uses saline spray, wakes up with this in the am. Clear mucous. Doesn't feel any throat drainage. Can sometimes have a globus sensation in the am. Can sometimes have some dysphagia. Can sometimes be associated with anxiety or "being uncomfortable"  Coronary CT chest 05/19/2020 reviewed by me shows no evidence interstitial disease or other pulmonary abnormality.  She has 4.3 cm ascending thoracic aortic aneurysm  CT angio chest 03/21/2019 reviewed by me shows her TAA, mild bibasilar atelectasis some scattered pulmonary nodules 1-2 mm.  No effusion or infiltrate.  Small hiatal hernia  Spirometry 04/2009 reviewed, normal airflows without BD response.    ROV 07/21/21 --this follow-up visit for evaluation of chronic cough and upper airway irritation.  She is 73 with a former tobacco history, hypertension, hyperlipidemia, depression, lymphedema.  She had a globus sensation.  I increased Nexium to pantoprazole twice daily, started loratadine and fluticasone nasal spray.  She underwent pulmonary function testing today Her GERD sx are less, as is her cough. Her nasal congestion is also better.   Pulmonary function testing performed today, reviewed by me, shows normal airflows without a bronchodilator response,  decreased residual volume consistent with possible restriction (gas dilution), normal diffusion capacity.  Chest x-ray 06/02/2021 reviewed by me, showed mild right basilar atelectasis. CT chest with some basilar interstitial change as below.    Review of Systems As per HPI  Past Medical History:  Diagnosis Date   Aneurysm of aorta ([place])    Arthritis    Atrophic vaginitis    Cellulitis and abscess of right leg 05/07/2018   Depression    Diverticulitis    DYSPNEA 06/04/2009   Qualifier: Diagnosis of  By: Elsworth Soho MD, Leanna Sato.     Edema of both legs    She takes Lasix a couple of times per week to decrease swelling.     Fatty liver    Hyperlipidemia    Hypertension    Keratosis    hands and lower limbs   Lymphedema    legs   Memory difficulty    Obesity    Reflux    Shingles    Venous stasis    lower legs     Family History  Problem Relation Age of Onset   Uterine cancer Mother    Heart disease Mother    Diabetes Mother    Hypertension Mother    Hyperlipidemia Mother    Alzheimer's disease Father    Hypertension Father    Cancer Paternal Grandmother        Gallbladder cancer   Diverticulitis Paternal Grandmother        gallbladder   Liver cancer Paternal Grandfather    Diverticulitis Paternal Grandfather    Diverticulitis Maternal Grandmother    Breast cancer Cousin  Paternal 1st cousin Age 38   Depression Sister    Bipolar disorder Sister      Social History   Socioeconomic History   Marital status: Married    Spouse name: [name]   Number of children: 2   Years of education: 12   Highest education level: Not on file  Occupational History   Occupation: Music therapist: [employer]    Comment: SELF-EMPLOYED   Tobacco Use   Smoking status: Former    Packs/day: 1.00    Years: 25.00    Pack years: 25.00    Types: Cigarettes   Smokeless tobacco: Never  Vaping Use   Vaping Use: Never used  Substance and Sexual Activity    Alcohol use: Yes    Alcohol/week: 0.0 standard drinks    Comment: Rare   Drug use: No   Sexual activity: Yes    Birth control/protection: Surgical    Comment: HYST-1st intercourse 70 yo-Fewer than 5 partners  Other Topics Concern   Not on file  Social History Narrative   Marital Status: Married Engineer, technical sales)   Children:  Sons (2)    Pets: Cat    Living Situation: Lives with husband    Occupation: Therapist, occupational - [employer]   Education: Programmer, systems    Tobacco Use/Exposure:  None    Alcohol Use:  Occasional   Drug Use:  None   Diet:  Regular   Exercise:  Bikram Yoga    Hobbies: Gardening/ Reading   Patient is right handed               Social Determinants of Radio broadcast assistant Strain: Not on file  Food Insecurity: Not on file  Transportation Needs: Not on file  Physical Activity: Not on file  Stress: Not on file  Social Connections: Not on file  Intimate Partner Violence: Not on file    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 Had a bird many years ago.  No mold exposure to her knowledge   Allergies  Allergen Reactions   Ciprofloxacin Other (See Comments)    Pt has a descending heart aneurism and has been instructed not to take Cipro.    Clindamycin/Lincomycin Other (See Comments)    Stomach pain   Morphine     REACTION: itch   Tetracyclines & Related Other (See Comments)    Stomach pain     Outpatient Medications Prior to Visit  Medication Sig Dispense Refill   ALPRAZolam (XANAX) 1 MG tablet Take 1 mg by mouth as needed.     amLODipine (NORVASC) 5 MG tablet Take 5 mg by mouth daily.     desvenlafaxine (PRISTIQ) 50 MG 24 hr tablet Take 50 mg by mouth daily.     fluticasone (FLONASE) 50 MCG/ACT nasal spray Place 2 sprays into both nostrils daily. 16 g 2   loratadine (CLARITIN) 10 MG tablet Take 1 tablet (10 mg total) by mouth daily. 30 tablet 5   metoprolol succinate (TOPROL-XL) 50 MG 24 hr tablet TAKE ONE TABLET BY MOUTH ONE  TIME DAILY WITH OR IMMEDIATLEY FOLLOWING A MEAL 90 tablet 3   pantoprazole (PROTONIX) 40 MG tablet Take 1 tablet twice daily until next OV. Take 1 hour before eating. 60 tablet 1   polyethylene glycol (MIRALAX / GLYCOLAX) 17 g packet Take 17 g by mouth daily.     rosuvastatin (CRESTOR) 20 MG tablet Take 1 tablet (20 mg total) by mouth daily.  90 tablet 3   triamterene-hydrochlorothiazide (MAXZIDE-25) 37.5-25 MG per tablet Take 1 tablet by mouth daily.       benzonatate (TESSALON) 100 MG capsule TAKE 1 CAPSULE BY MOUTH THREE TIMES A DAY AS NEEDED FOR COUGH (Patient not taking: Reported on 07/21/2021)     esomeprazole (NEXIUM) 20 MG capsule Take 20 mg by mouth daily.  (Patient not taking: Reported on 07/21/2021)     SYMBICORT 80-4.5 MCG/ACT inhaler as needed. (Patient not taking: Reported on 07/21/2021)     No facility-administered medications prior to visit.         Objective:   Physical Exam Vitals:   07/21/21 1558  BP: 120/76  Pulse: 87  Temp: 97.8 F (36.6 C)  TempSrc: Oral  SpO2: 97%  Weight: 202 lb (91.6 kg)  Height: 5' 4"  (1.626 m)    Gen: Pleasant, well-nourished, in no distress,  normal affect  ENT: No lesions,  mouth clear,  oropharynx clear, no postnasal drip  Neck: No JVD, no stridor  Lungs: No use of accessory muscles, no crackles or wheezing on normal respiration, no wheeze on forced expiration  Cardiovascular: RRR, heart sounds normal, no murmur or gallops, no peripheral edema  Musculoskeletal: No deformities, no cyanosis or clubbing  Neuro: alert, awake, non focal  Skin: Warm, no lesions or rash      Assessment & Plan:  Chronic cough With contributions of both GERD and chronic rhinitis.  I treated both more aggressively now her cough is better.  We will try peeling off some of these medications to see if she can tolerate.  Start with the pantoprazole twice daily, changed to daily.  Her pulmonary function testing was reassuring without any evidence of  obstruction.  Hold off on bronchodilator therapy.  She did have some mild interstitial changes on CT scan of the chest from a couple years ago.  Basilar atelectasis on her most recent chest x-ray.  Consider following serial chest x-rays to ensure no progression going forward.  Baltazar Apo, MD, PhD 07/21/2021, 4:27 PM [facility] [phone] or if no answer before 7:00PM call [phone] For any issues after 7:00PM please call eLink [phone]

## 2021-07-21 NOTE — Patient Instructions (Signed)
I am glad that your cough is improved.   We reviewed your pulmonary function testing today and your airflows were normal.  We will not start any inhaled medication for now. Try decreasing your pantoprazole (Protonix) to once a day.  If your cough returns then you may need to go back to twice a day. You can also try stopping either your loratadine (Claritin) or fluticasone nasal spray to see if your cough flares in absence of these medications.  Depending on how your symptoms do you may need to be back on them. Follow with Dr. Lamonte Sakai in 1 year or sooner if you have any problems.

## 2021-07-31 DIAGNOSIS — M545 Low back pain, unspecified: Secondary | ICD-10-CM | POA: Diagnosis not present

## 2021-07-31 DIAGNOSIS — M5459 Other low back pain: Secondary | ICD-10-CM | POA: Diagnosis not present

## 2021-08-19 DIAGNOSIS — M5416 Radiculopathy, lumbar region: Secondary | ICD-10-CM | POA: Diagnosis not present

## 2021-08-25 DIAGNOSIS — M5451 Vertebrogenic low back pain: Secondary | ICD-10-CM | POA: Diagnosis not present

## 2021-08-31 ENCOUNTER — Other Ambulatory Visit: Payer: Self-pay | Admitting: Cardiology

## 2021-09-01 ENCOUNTER — Other Ambulatory Visit: Payer: Self-pay | Admitting: Emergency Medicine

## 2021-09-04 DIAGNOSIS — M25561 Pain in right knee: Secondary | ICD-10-CM | POA: Diagnosis not present

## 2021-09-04 DIAGNOSIS — M1711 Unilateral primary osteoarthritis, right knee: Secondary | ICD-10-CM | POA: Diagnosis not present

## 2021-09-04 DIAGNOSIS — Z85828 Personal history of other malignant neoplasm of skin: Secondary | ICD-10-CM | POA: Diagnosis not present

## 2021-09-04 DIAGNOSIS — L820 Inflamed seborrheic keratosis: Secondary | ICD-10-CM | POA: Diagnosis not present

## 2021-09-04 DIAGNOSIS — L608 Other nail disorders: Secondary | ICD-10-CM | POA: Diagnosis not present

## 2021-09-04 DIAGNOSIS — L578 Other skin changes due to chronic exposure to nonionizing radiation: Secondary | ICD-10-CM | POA: Diagnosis not present

## 2021-09-04 DIAGNOSIS — Z96652 Presence of left artificial knee joint: Secondary | ICD-10-CM | POA: Diagnosis not present

## 2021-09-04 DIAGNOSIS — L821 Other seborrheic keratosis: Secondary | ICD-10-CM | POA: Diagnosis not present

## 2021-09-22 DIAGNOSIS — M47816 Spondylosis without myelopathy or radiculopathy, lumbar region: Secondary | ICD-10-CM | POA: Diagnosis not present

## 2021-10-13 ENCOUNTER — Encounter: Payer: Self-pay | Admitting: *Deleted

## 2021-10-14 ENCOUNTER — Ambulatory Visit: Payer: Medicare Other | Admitting: Vascular Surgery

## 2021-10-14 ENCOUNTER — Other Ambulatory Visit: Payer: Self-pay

## 2021-10-14 DIAGNOSIS — R41840 Attention and concentration deficit: Secondary | ICD-10-CM | POA: Diagnosis not present

## 2021-10-14 DIAGNOSIS — M47819 Spondylosis without myelopathy or radiculopathy, site unspecified: Secondary | ICD-10-CM | POA: Diagnosis not present

## 2021-10-14 DIAGNOSIS — F3341 Major depressive disorder, recurrent, in partial remission: Secondary | ICD-10-CM | POA: Diagnosis not present

## 2021-10-22 ENCOUNTER — Other Ambulatory Visit: Payer: Medicare Other

## 2021-10-22 DIAGNOSIS — Z85828 Personal history of other malignant neoplasm of skin: Secondary | ICD-10-CM | POA: Diagnosis not present

## 2021-10-22 DIAGNOSIS — L820 Inflamed seborrheic keratosis: Secondary | ICD-10-CM | POA: Diagnosis not present

## 2021-10-22 DIAGNOSIS — L603 Nail dystrophy: Secondary | ICD-10-CM | POA: Diagnosis not present

## 2021-10-22 DIAGNOSIS — L570 Actinic keratosis: Secondary | ICD-10-CM | POA: Diagnosis not present

## 2021-10-22 DIAGNOSIS — D045 Carcinoma in situ of skin of trunk: Secondary | ICD-10-CM | POA: Diagnosis not present

## 2021-10-23 ENCOUNTER — Other Ambulatory Visit: Payer: Self-pay | Admitting: Emergency Medicine

## 2021-10-27 DIAGNOSIS — R32 Unspecified urinary incontinence: Secondary | ICD-10-CM | POA: Diagnosis not present

## 2021-10-27 DIAGNOSIS — E782 Mixed hyperlipidemia: Secondary | ICD-10-CM | POA: Diagnosis not present

## 2021-10-27 DIAGNOSIS — E669 Obesity, unspecified: Secondary | ICD-10-CM | POA: Diagnosis not present

## 2021-10-27 DIAGNOSIS — R41840 Attention and concentration deficit: Secondary | ICD-10-CM | POA: Diagnosis not present

## 2021-10-27 DIAGNOSIS — I10 Essential (primary) hypertension: Secondary | ICD-10-CM | POA: Diagnosis not present

## 2021-10-28 ENCOUNTER — Encounter: Payer: Self-pay | Admitting: Vascular Surgery

## 2021-10-28 ENCOUNTER — Other Ambulatory Visit: Payer: Self-pay

## 2021-10-28 ENCOUNTER — Ambulatory Visit: Payer: Medicare Other | Admitting: Vascular Surgery

## 2021-10-28 VITALS — BP 114/73 | HR 77 | Temp 98.1°F | Resp 20 | Ht 64.0 in | Wt 200.0 lb

## 2021-10-28 DIAGNOSIS — I872 Venous insufficiency (chronic) (peripheral): Secondary | ICD-10-CM | POA: Diagnosis not present

## 2021-10-28 NOTE — Progress Notes (Signed)
Patient ID: Patricia [name], female   DOB: Oct 10, 1951, 71 y.o.   MRN: [id number]  Reason for Consult: No chief complaint on file.   Referred by Patricia Neer, MD  Subjective:     HPI:  Patricia [name] is a 71 y.o. female here for 31-monthfollow-up with bilateral lower extremity left greater than right pain and swelling.  [facility] of the right lower extremity and now intermittently wears lymphedema pumps.  She has s now been compliant with compression stockings for 3 months.  She does not have any tissue loss or ulceration at this time.  She states that her swelling is really fully resolved in the past 2 months and at this time she has no complaints.  Past Medical History:  Diagnosis Date   Aneurysm of aorta ([place]    Arthritis    Atrophic vaginitis    Cellulitis and abscess of right leg 05/07/2018   Depression    Diverticulitis    DYSPNEA 06/04/2009   Qualifier: Diagnosis of  By: AElsworth SohoMD, RLeanna Sato     Edema of both legs    She takes Lasix a couple of times per week to decrease swelling.     Fatty liver    Hyperlipidemia    Hypertension    Keratosis    hands and lower limbs   Lymphedema    legs   Memory difficulty    Obesity    Reflux    Shingles    Venous stasis    lower legs   Family History  Problem Relation Age of Onset   Uterine cancer Mother    Heart disease Mother    Diabetes Mother    Hypertension Mother    Hyperlipidemia Mother    Alzheimer's disease Father    Hypertension Father    Cancer Paternal Grandmother        Gallbladder cancer   Diverticulitis Paternal Grandmother        gallbladder   Liver cancer Paternal Grandfather    Diverticulitis Paternal Grandfather    Diverticulitis Maternal Grandmother    Breast cancer Cousin        Paternal 1st cousin Age 72  Depression Sister    Bipolar disorder Sister    Past Surgical History:  Procedure Laterality Date   ABDOMINAL  HYSTERECTOMY     AUGMENTATION MAMMAPLASTY     Basal and Squamous cell cancers excised     CESAREAN SECTION     X 2   EYE SURGERY     bilateral cataract with lens implants   KIDNEY STONE SURGERY     removal couldnt blast   KNEE SURGERY     lipoma surgery     LIPOSUCTION     knees   TOTAL KNEE ARTHROPLASTY Left 09/26/2018   Procedure: TOTAL KNEE ARTHROPLASTY;  Surgeon: OParalee Cancel MD;  Location: [facility];  Service: Orthopedics;  Laterality: Left;  70 mins   TOTAL KNEE ARTHROPLASTY     left   tummy tuck      Short Social History:  Social History   Tobacco Use   Smoking status: Former    Packs/day: 1.00    Years: 25.00    Pack years: 25.00    Types: Cigarettes   Smokeless tobacco: Never  Substance Use Topics   Alcohol use: Yes    Alcohol/week: 0.0 standard drinks    Comment: Rare    Allergies  Allergen Reactions   Ciprofloxacin Other (See Comments)    Pt has a descending heart aneurism and has been instructed not to take Cipro.    Clindamycin/Lincomycin Other (See Comments)    Stomach pain   Morphine     REACTION: itch   Tetracyclines & Related Other (See Comments)    Stomach pain    Current Outpatient Medications  Medication Sig Dispense Refill   ALPRAZolam (XANAX) 1 MG tablet Take 1 mg by mouth as needed.     amLODipine (NORVASC) 5 MG tablet Take 5 mg by mouth daily.     benzonatate (TESSALON) 100 MG capsule TAKE 1 CAPSULE BY MOUTH THREE TIMES A DAY AS NEEDED FOR COUGH (Patient not taking: Reported on 07/21/2021)     desvenlafaxine (PRISTIQ) 50 MG 24 hr tablet Take 50 mg by mouth daily.     esomeprazole (NEXIUM) 20 MG capsule Take 20 mg by mouth daily.  (Patient not taking: Reported on 07/21/2021)     fluticasone (FLONASE) 50 MCG/ACT nasal spray INSTILL TWO SPRAYS INTO EACH NOSTRILS DAILY 16 g 0   loratadine (CLARITIN) 10 MG tablet Take 1 tablet (10 mg total) by mouth daily. 30 tablet 5   metoprolol succinate (TOPROL-XL) 50 MG 24 hr tablet TAKE ONE TABLET BY  MOUTH ONE TIME DAILY with or immediately following a meal 90 tablet 0   pantoprazole (PROTONIX) 40 MG tablet TAKE ONE TABLET BY MOUTH ONE HOUR BEFORE EATING TWICE DAILY UNTIL NEXT OFFICE VISIT 60 tablet 1   polyethylene glycol (MIRALAX / GLYCOLAX) 17 g packet Take 17 g by mouth daily.     rosuvastatin (CRESTOR) 20 MG tablet TAKE ONE TABLET BY MOUTH ONE TIME DAILY 90 tablet 0   SYMBICORT 80-4.5 MCG/ACT inhaler as needed. (Patient not taking: Reported on 07/21/2021)     triamterene-hydrochlorothiazide (MAXZIDE-25) 37.5-25 MG per tablet Take 1 tablet by mouth daily.       No current facility-administered medications for this visit.    Review of Systems  Constitutional:  Constitutional negative. HENT: HENT negative.  Eyes: Eyes negative.  Respiratory: Respiratory negative.  Cardiovascular: Cardiovascular negative.  GI: Gastrointestinal negative.  Musculoskeletal: Musculoskeletal negative.  Skin: Skin negative.  Neurological: Neurological negative. Hematologic: Hematologic/lymphatic negative.  Psychiatric: Psychiatric negative.       Objective:  Objective  Vitals:   10/28/21 1612  BP: 114/73  Pulse: 77  Resp: 20  Temp: 98.1 F (36.7 C)  SpO2: 95%     Physical Exam HENT:     Head: Normocephalic.     Nose:     Comments: Wearing a mask Eyes:     Pupils: Pupils are equal, round, and reactive to light.  Cardiovascular:     Rate and Rhythm: Normal rate.     Pulses: Normal pulses.  Pulmonary:     Effort: Pulmonary effort is normal.  Abdominal:     General: Abdomen is flat.     Palpations: Abdomen is soft.  Musculoskeletal:        General: Normal range of motion.     Cervical back: Normal range of motion.     Right lower leg: No edema.     Left lower leg: No edema.  Skin:    Capillary Refill: Capillary refill takes less than 2 seconds.     Comments: Stemmer sign negative  Neurological:     General: No focal deficit present.     Mental Status: She is alert and oriented  to person, place, and time.  Psychiatric:  Mood and Affect: Mood normal.        Behavior: Behavior normal.        Thought Content: Thought content normal.        Judgment: Judgment normal.    Data:  LEFT           Reflux No Reflux Reflux Time Diameter cms Comments                              Yes                                       +--------------+---------+------+-----------+------------+--------+   CFV            no                                                   +--------------+---------+------+-----------+------------+--------+   FV mid         no                                                   +--------------+---------+------+-----------+------------+--------+   Popliteal      no                                                   +--------------+---------+------+-----------+------------+--------+   GSV at SFJ                yes     >500 ms      0.646                +--------------+---------+------+-----------+------------+--------+   GSV prox thigh no                              0.643                +--------------+---------+------+-----------+------------+--------+   GSV mid thigh  no                              0.596                +--------------+---------+------+-----------+------------+--------+   GSV dist thigh            yes     >500 ms      0.548                +--------------+---------+------+-----------+------------+--------+   GSV at knee               yes     >500 ms       0.56                +--------------+---------+------+-----------+------------+--------+   GSV prox calf  no  0.459                +--------------+---------+------+-----------+------------+--------+   SSV Pop Fossa  no                               0.69                +--------------+---------+------+-----------+------------+--------+   SSV prox calf  no                              0.349                 +--------------+---------+------+-----------+------------+--------+   SSV mid calf   no                              0.396                +--------------+---------+------+-----------+------------+--------+       Summary:  Left:  - No evidence of deep vein thrombosis seen in the left lower extremity,  from the common femoral through the popliteal veins.  - No evidence of superficial venous thrombosis in the left lower  extremity.  - No evidence of deep vein reflux.  - Superficial vein reflux in the SFJ and GSV as above.      Assessment/Plan:     71 year old female with C3 venous disease and reflux in her great saphenous vein from the saphenofemoral junction down to the knee.  I personally evaluated the great saphenous vein on the left and appears smaller on my exam then what was noted on recent duplex but would be amenable for endovenous ablation if necessary.  Either way she has no symptoms at this time and I have recommended continued compression stockings and follow-up on as-needed basis.    Waynetta Sandy MD [facility]

## 2021-10-29 DIAGNOSIS — D045 Carcinoma in situ of skin of trunk: Secondary | ICD-10-CM | POA: Diagnosis not present

## 2021-10-29 DIAGNOSIS — Z85828 Personal history of other malignant neoplasm of skin: Secondary | ICD-10-CM | POA: Diagnosis not present

## 2021-10-30 ENCOUNTER — Other Ambulatory Visit: Payer: Self-pay | Admitting: Emergency Medicine

## 2021-11-01 DIAGNOSIS — J40 Bronchitis, not specified as acute or chronic: Secondary | ICD-10-CM | POA: Diagnosis not present

## 2021-11-05 IMAGING — CT CT HEART MORP W/ CTA COR W/ SCORE W/ CA W/CM &/OR W/O CM
4 of 7 series · 8 of 20 positions shown, 9 images · non-contrast
Comparison: 03/21/2019
COMPARISON: 03/21/2019

Addendum:
EXAM:
OVER-READ INTERPRETATION  CT CHEST

The following report is an over-read performed by radiologist Dr.
Kwaku Heisler [REDACTED] on 05/19/2020. This over-read
does not include interpretation of cardiac or coronary anatomy or
pathology. The coronary CTA interpretation by the cardiologist is
attached.
CLINICAL DATA: 69 year old female with chest and jaw pain with
known aortic dilation and atherosclerosis.
Cardiac/Coronary  CTA
TECHNIQUE: The patient was scanned on a [employer] Force scanner.

[Series 7: best diast 74 % · axial · 0.36mm/px · z∈[+1034,+1075]mm · 2 of 311 slices shown, 3 images]
[im 104/311  vessel]
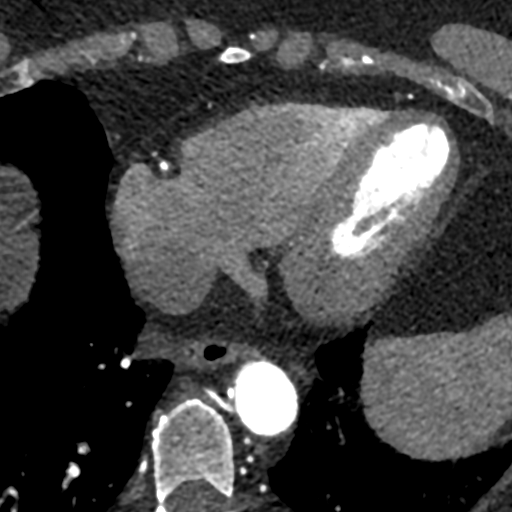
[im 104/311  lung]
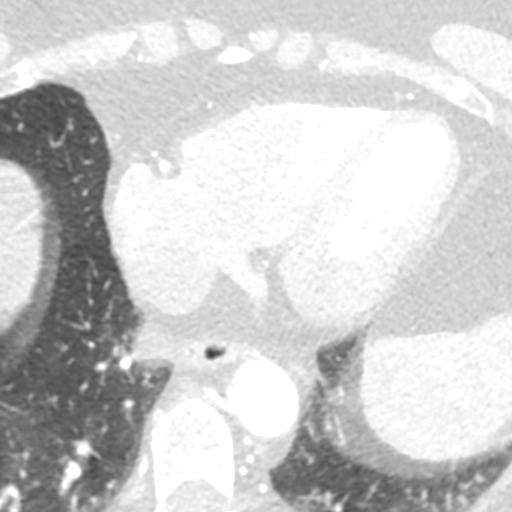
[im 207/311  vessel]
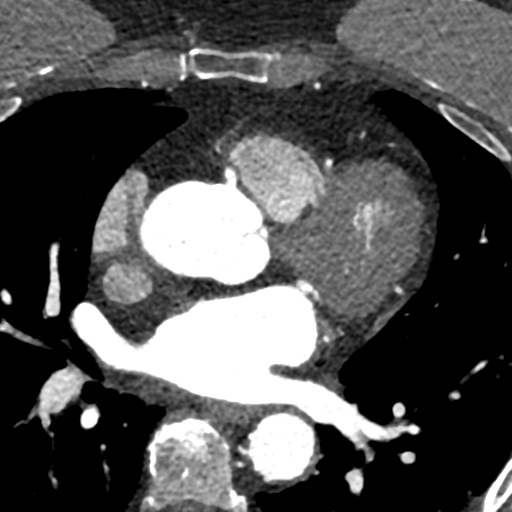

[Series 8: best syst 40 % · axial · 0.36mm/px · z∈[+1038,+1077]mm · 2 of 293 slices shown]
[im 98/293  vessel]
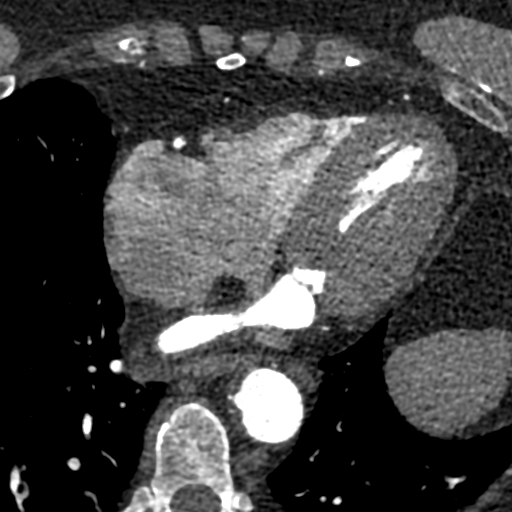
[im 195/293  vessel]
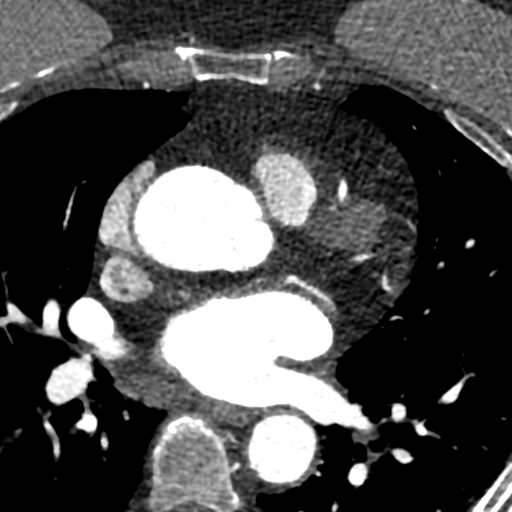

[Series 9: ts diast sharp 74 % · axial · 0.36mm/px · z∈[+1038,+1077]mm · 2 of 293 slices shown]
[im 98/293  lung]
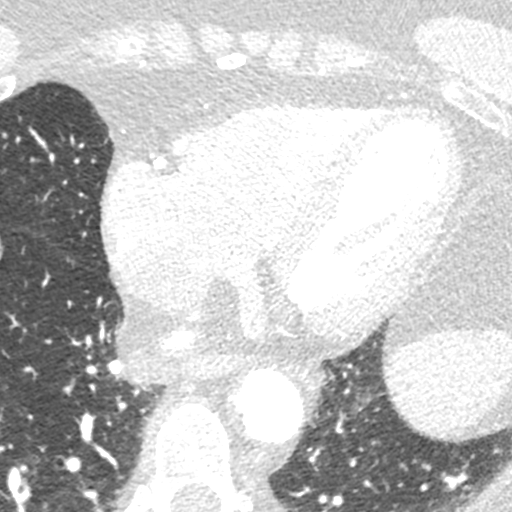
[im 195/293  lung]
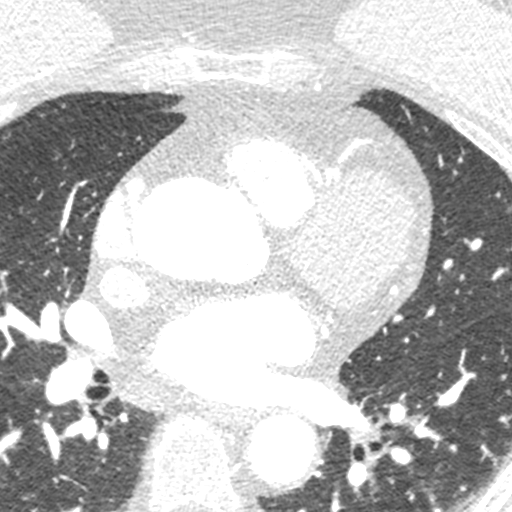

[Series 10: ts syst sharp 40 % · axial · 0.36mm/px · z∈[+1038,+1077]mm · 2 of 293 slices shown]
[im 98/293  lung]
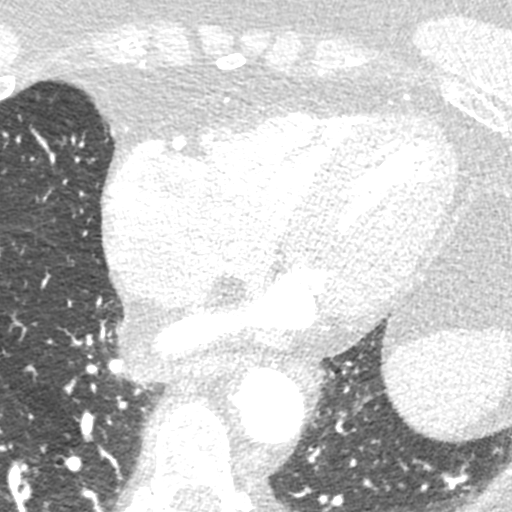
[im 195/293  lung]
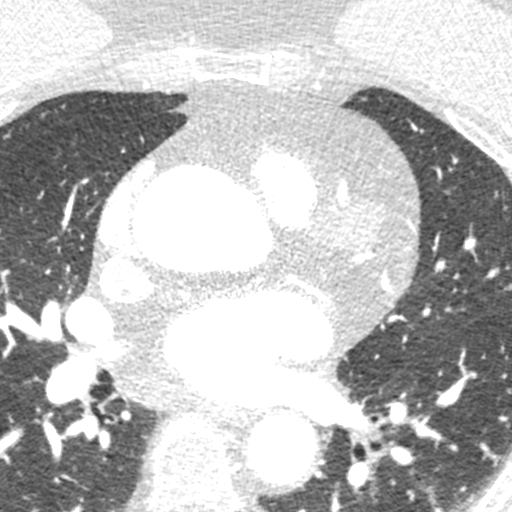

[8 of 20 positions shown; findings below may reference images not displayed]

FINDINGS: Vascular: Thoracic aortic aneurysm with the ascending thoracic aorta
measuring up to 4.3 cm compared to 4.4 cm previously. Aortic
atherosclerosis.

Mediastinum/Nodes: No adenopathy

Lungs/Pleura: No confluent opacities or effusions.

Upper Abdomen: Imaging into the upper abdomen shows no acute
findings.

Musculoskeletal: Bilateral breast implants. No acute bony
abnormality.
IMPRESSION: 4.3 cm ascending thoracic aortic aneurysm, stable since prior study.
Recommend annual imaging followup by CTA or MRA. This recommendation
follows 1565 ACCF/AHA/AATS/ACR/ASA/SCA/BOBSU/MOTOYUKI/BADHON/TANKA Guidelines
for the Diagnosis and Management of Patients with Thoracic Aortic
Disease. Circulation. 1565; 121: E266-e369. Aortic aneurysm NOS
(FNSM7-6CW.G)

No acute extra cardiac abnormality.
FINDINGS: A 90 kV prospective scan was triggered in the descending thoracic
aorta at 111 HU's. Axial non-contrast 3 mm slices were carried out
through the heart. The data set was analyzed on a dedicated work
station and scored using the Agatson method. Gantry rotation speed
was 250 msecs and collimation was .6 mm. Beta blockade and 0.8 mg of
sl NTG was given. The 3D data set was reconstructed in 5% intervals
of the 67-82 % of the R-R cycle. Diastolic phases were analyzed on a
dedicated work station using MPR, MIP and VRT modes. The patient
received 80 cc of contrast.

Aorta: 43 mm ascending aorta, dilated, stable from prior. Both
ascending and descending atherosclerosis. No dissection.

Aortic Valve:  Trileaflet.  No calcifications.

Coronary Arteries:  Normal coronary origin.  Right dominance.

RCA is a large dominant artery that gives rise to PDA and PLA.
Proximal calcified plaque with 0-24% stenosis

Left main is a large artery that gives rise to LAD and LCX arteries.

LAD is a large vessel that has calcified plaque proximally with
0-24% stenosis.

Ramus mixed plaque proximally with 50-69% stenosis. Sending for FFR
analysis

LCX is a non-dominant artery that gives rise to one large OM1
branch. There is no plaque.

Other findings:

Normal pulmonary vein drainage into the left atrium.

Normal left atrial appendage without a thrombus.

Normal size of the pulmonary artery.

Please see radiology report for non cardiac findings.
IMPRESSION: 1. Coronary calcium score of 348. This was 88 percentile for age and
sex matched control.

2. Normal coronary origin with right dominance.

3.  There is LAD calcified plaque proximally with 0-24% stenosis.

4. There is Ramus mixed plaque proximally with 50-69% stenosis.
Sending for FFR analysis.

5.  There is RCA calcified plaque with 0-24% stenosis.

6. Dilated ascending aorta, 43 mm, stable from prior. Continue to
monitor annually.

*** End of Addendum ***
EXAM:
OVER-READ INTERPRETATION  CT CHEST

The following report is an over-read performed by radiologist Dr.
Kwaku Heisler [REDACTED] on 05/19/2020. This over-read
does not include interpretation of cardiac or coronary anatomy or
pathology. The coronary CTA interpretation by the cardiologist is
attached.
FINDINGS: Vascular: Thoracic aortic aneurysm with the ascending thoracic aorta
measuring up to 4.3 cm compared to 4.4 cm previously. Aortic
atherosclerosis.

Mediastinum/Nodes: No adenopathy

Lungs/Pleura: No confluent opacities or effusions.

Upper Abdomen: Imaging into the upper abdomen shows no acute
findings.

Musculoskeletal: Bilateral breast implants. No acute bony
abnormality.
IMPRESSION: 4.3 cm ascending thoracic aortic aneurysm, stable since prior study.
Recommend annual imaging followup by CTA or MRA. This recommendation
follows 1565 ACCF/AHA/AATS/ACR/ASA/SCA/BOBSU/MOTOYUKI/BADHON/TANKA Guidelines
for the Diagnosis and Management of Patients with Thoracic Aortic
Disease. Circulation. 1565; 121: E266-e369. Aortic aneurysm NOS
(FNSM7-6CW.G)

No acute extra cardiac abnormality.

## 2021-11-12 DIAGNOSIS — M545 Low back pain, unspecified: Secondary | ICD-10-CM | POA: Diagnosis not present

## 2021-11-20 ENCOUNTER — Other Ambulatory Visit: Payer: Self-pay | Admitting: Emergency Medicine

## 2021-12-23 ENCOUNTER — Other Ambulatory Visit: Payer: Self-pay | Admitting: Cardiology

## 2022-01-05 DIAGNOSIS — L820 Inflamed seborrheic keratosis: Secondary | ICD-10-CM | POA: Diagnosis not present

## 2022-01-05 DIAGNOSIS — L821 Other seborrheic keratosis: Secondary | ICD-10-CM | POA: Diagnosis not present

## 2022-01-05 DIAGNOSIS — L814 Other melanin hyperpigmentation: Secondary | ICD-10-CM | POA: Diagnosis not present

## 2022-01-05 DIAGNOSIS — Z85828 Personal history of other malignant neoplasm of skin: Secondary | ICD-10-CM | POA: Diagnosis not present

## 2022-01-20 ENCOUNTER — Other Ambulatory Visit: Payer: Self-pay | Admitting: Emergency Medicine

## 2022-01-20 DIAGNOSIS — M25561 Pain in right knee: Secondary | ICD-10-CM | POA: Diagnosis not present

## 2022-01-20 DIAGNOSIS — M1711 Unilateral primary osteoarthritis, right knee: Secondary | ICD-10-CM | POA: Diagnosis not present

## 2022-01-29 ENCOUNTER — Other Ambulatory Visit: Payer: Self-pay | Admitting: Cardiology

## 2022-02-01 DIAGNOSIS — R41840 Attention and concentration deficit: Secondary | ICD-10-CM | POA: Diagnosis not present

## 2022-02-01 DIAGNOSIS — E669 Obesity, unspecified: Secondary | ICD-10-CM | POA: Diagnosis not present

## 2022-02-01 DIAGNOSIS — F3342 Major depressive disorder, recurrent, in full remission: Secondary | ICD-10-CM | POA: Diagnosis not present

## 2022-02-01 DIAGNOSIS — R32 Unspecified urinary incontinence: Secondary | ICD-10-CM | POA: Diagnosis not present

## 2022-02-04 NOTE — Therapy (Signed)
?OUTPATIENT PHYSICAL THERAPY FEMALE PELVIC EVALUATION ? ? ?Patient Name: Patricia [name] ?MRN: [id number] ?DOB:1951-05-13, 71 y.o., female ?Today's Date: 02/05/2022 ? ? PT End of Session - 02/05/22 0932   ? ? Visit Number 1   ? Date for PT Re-Evaluation 04/16/22   ? Authorization Type [employer] Medicare   ? Progress Note Due on Visit 10   ? PT Start Time [phone]   ? PT Stop Time 5809   ? PT Time Calculation (min) 46 min   ? Activity Tolerance Patient tolerated treatment well   ? Behavior During Therapy [facility] for tasks assessed/performed   ? ?  ?  ? ?  ? ? ?Past Medical History:  ?Diagnosis Date  ? Aneurysm of aorta (HCC)   ? Arthritis   ? Atrophic vaginitis   ? Cellulitis and abscess of right leg 05/07/2018  ? Depression   ? Diverticulitis   ? DYSPNEA 06/04/2009  ? Qualifier: Diagnosis of  By: Elsworth Soho MD, Leanna Sato.    ? Edema of both legs   ? She takes Lasix a couple of times per week to decrease swelling.    ? Fatty liver   ? Hyperlipidemia   ? Hypertension   ? Keratosis   ? hands and lower limbs  ? Lymphedema   ? legs  ? Memory difficulty   ? Obesity   ? Reflux   ? Shingles   ? Venous stasis   ? lower legs  ? ?Past Surgical History:  ?Procedure Laterality Date  ? ABDOMINAL HYSTERECTOMY    ? AUGMENTATION MAMMAPLASTY    ? Basal and Squamous cell cancers excised    ? CESAREAN SECTION    ? X 2  ? EYE SURGERY    ? bilateral cataract with lens implants  ? KIDNEY STONE SURGERY    ? removal couldnt blast  ? KNEE SURGERY    ? lipoma surgery    ? LIPOSUCTION    ? knees  ? TOTAL KNEE ARTHROPLASTY Left 09/26/2018  ? Procedure: TOTAL KNEE ARTHROPLASTY;  Surgeon: Paralee Cancel, MD;  Location: [facility];  Service: Orthopedics;  Laterality: Left;  70 mins  ? TOTAL KNEE ARTHROPLASTY    ? left  ? tummy tuck    ? ?Patient Active Problem List  ? Diagnosis Date Noted  ? Chronic venous insufficiency 07/14/2021  ? Chronic cough 06/02/2021  ? Allergic rhinitis 05/15/2019  ? Amnesia 05/15/2019  ? Bunion 05/15/2019  ? Colon, diverticulosis 05/15/2019  ?  NASH (nonalcoholic steatohepatitis) 05/15/2019  ? Varicose veins of other specified sites 05/15/2019  ? Pain in thumb joint with movement of left hand 03/29/2019  ? Pain in thumb joint with movement, right 03/29/2019  ? S/P left TKA 09/26/2018  ? Osteoarthritis of left knee 09/06/2018  ? Fatigue 08/07/2018  ? Need for influenza vaccination 08/07/2018  ? Venous stasis syndrome 07/18/2018  ? Lymphedema of right lower extremity 05/29/2018  ? Cellulitis 05/07/2018  ? Cellulitis and abscess of right leg 05/07/2018  ? Pain in left knee 12/09/2017  ? Lipoma 10/29/2014  ? Encounter for consultation 11/06/2013  ? Essential hypertension, benign 02/26/2013  ? Obesity, unspecified 02/26/2013  ? Aneurysm of aorta (HCC)   ? Depression   ? Fatty liver   ? Reflux   ? Atrophic vaginitis   ? DYSPNEA 06/04/2009  ? HYPERLIPIDEMIA 06/03/2009  ? HYPERTENSION 06/03/2009  ? MITRAL VALVE PROLAPSE 06/03/2009  ? Esophageal reflux 06/03/2009  ? ? ?PCP: [name], MD ? ?REFERRING PROVIDER: Brigitte Pulse,  Nathen May, MD ? ?REFERRING DIAG: R32 (ICD-10-CM) - Unspecified urinary incontinence ? ?THERAPY DIAG:  ?Muscle weakness (generalized) ? ?Unspecified lack of coordination ? ?ONSET DATE: 01/17/20 ? ?SUBJECTIVE:                                                                                                                                                                                          ? ?SUBJECTIVE STATEMENT: ?Pt states that she started having issues with urinary incontinence about 2 years ago with just a little leaking, and now it has gotten worse. She leaking with any cough/sneeze and when she wakes up in the middle of the night to go to the bathroom she will leak on the way to hte bathroom.  ?Fluid intake: Yes: 24oz of water, one cup of coffee each morning, once and a while alcohol.   ? ?Patient confirms identification and approves PT to assess pelvic floor and treatment Yes ? ? ?PAIN:  ?Are you having pain? Yes- occasionally lower abdomen  that feels like a lot of pressure ? ? ?PRECAUTIONS: None ? ?WEIGHT BEARING RESTRICTIONS No ? ?FALLS:  ?Has patient fallen in last 6 months? No ? ?LIVING ENVIRONMENT: ?Lives with: lives with their family ?Lives in: House/apartment ? ? 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 ?PLOF: Independent ? ?PATIENT GOALS decrease leaking ? ?PERTINENT HISTORY:  ?Diverticulosis ?Sexual abuse: No ? ?BOWEL MOVEMENT ?Pain with bowel movement: No ?Type of bowel movement:Frequency 2x/day and Strain No ?Fully empty rectum: Yes: - ?Leakage: No ?Pads: No ?Fiber supplement: No ? ?URINATION ?Pain with urination: No ?Fully empty bladder: No - she has to move around to completely empty bladder and sometimes post-void dribbling ?Stream:  WNL except for dribbles at the end ?Urgency: Yes: depends on when she last went how long she has to make it ?Frequency: 2-3 hours during the day and 2x/night ?Leakage: Urge to void, Coughing, Sneezing, and Laughing ?Pads: Yes: one/day ? ?INTERCOURSE ?Pain with intercourse: Initial Penetration ?Ability to have vaginal penetration:  Yes: but states there is a lot of dryness ?[place]: yes ? ? ?PREGNANCY ?Vaginal deliveries 2 ?Tearing No ?C-section deliveries 2 ? ? ?OBJECTIVE 02/04/22:  ? ? ?COGNITION: ? Overall cognitive status: Within functional limits for tasks assessed   ?  ?SENSATION: ? Light touch: Appears intact ? Proprioception: Appears intact ? ?PELVIC MMT: ?Strength 2/5 with poor coordination/isolation, 7 repetitions with increasing difficulty with isolation and coordination, endurance 3 seconds; mild tenderness throughout superficial muscles.  ? ?      PALPATION: ?  General  mild tenderness in lower abdomen and pinpoint tenderness over pubic symphysis; no diastasis present  but coning in upper abdominals with strength testing and weakness ? ?              External Perineal Exam evidence of changes consistent with GSM  ?              ?              Internal Pelvic Floor very mild  tenderness in superficial pelvic floor with dryness noted ? ?TONE: ?WNL ? ?PROLAPSE: ?None palpable - difficulty with coordination of bearing down ? ?TODAY'S TREATMENT 02/05/22 ?EVAL  ?Manual: ?Soft tissue mobilization: ?Scar tissue mobilization: ?Myofascial release: ?Spinal mobilization: ?Internal pelvic floor techniques: ?Dry needling: ?Neuromuscular re-education: ?Core retraining:  ?Core facilitation: ?Form correction: ?Pelvic floor contraction training: ?Quick flicks 01S with multimodal cues for improved coordination with breathing ?Long holds 5 x 10 seconds  ?Down training: ?Exercises: ?Stretches/mobility: ?Strengthening: ?Therapeutic activities: ?Functional strengthening activities: ?Self-care: ?Vulvovaginal massage ?Lubricant samples and using during intercourse ?Pelvic floor anatomy ? ? ? ?PATIENT EDUCATION:  ?Education details: See above self-care ?Person educated: Patient ?Education method: Explanation, Demonstration, Tactile cues, Verbal cues, and Handouts ?Education comprehension: verbalized understanding ? ? ?HOME EXERCISE PROGRAM: ?[id number] ? ?ASSESSMENT: ? ?CLINICAL IMPRESSION: ?Patient is a 71 y.o. female who was seen today for physical therapy evaluation and treatment for urge and stress incontinence and nocturia. Exam findings notable for decreased pelvic floor strength 2/5, decreased pelvic floor endurance 3 seconds, poor coordination and isolation of pelvic floor contraction that worsened over the course of 7 repetitions, tenderness throughout superficial pelvic floor muscles and evidence of GSM changes, and lower abdominal tenderness most significant directly over pubic symphysis. Signs and symptoms are most consistent with pelvic floor and core weakness that are most likely exacerbated by decrease in estrogen to vular/vaginal tissues. Initial treatment consistent of pelvic floor contraction training for quick flicks and long holds; pt education performed on vulvovaginal massage/moisturization  and lubricant use during intercourse (samples given). She will benefit from skilled PT intervention in order to address impairments, decrease urinary incontinence, and improve QOL.  ? ? ?OBJECTIVE IMPAIRME

## 2022-02-05 ENCOUNTER — Ambulatory Visit: Payer: Medicare Other | Attending: Family Medicine

## 2022-02-05 DIAGNOSIS — R32 Unspecified urinary incontinence: Secondary | ICD-10-CM | POA: Insufficient documentation

## 2022-02-05 DIAGNOSIS — M6281 Muscle weakness (generalized): Secondary | ICD-10-CM | POA: Diagnosis not present

## 2022-02-05 DIAGNOSIS — R279 Unspecified lack of coordination: Secondary | ICD-10-CM | POA: Insufficient documentation

## 2022-02-05 NOTE — Patient Instructions (Signed)
Vulvar/vaginal Massage: ?This is a technique to help decrease painful sensitivity in the vaginal area. It ?can also help to restore normal moisture levels in the vaginal tissues. ?With coconut oil, aloe, jojoba oil, or a specific vaginal moisturizer, gently ?massage into vaginal tissues. ?Think of this as part of your post-shower routine and moisturizing just like ?you would the rest of the body with lotion. ?This helps to increase good blood flow to the vaginal tissues. ?In addition, it also teaches the body that touch to the vagina does not have ?to be painful or threatening, but moisturizing and gentle. ? ?V-Magin ?

## 2022-02-24 ENCOUNTER — Encounter (HOSPITAL_BASED_OUTPATIENT_CLINIC_OR_DEPARTMENT_OTHER): Payer: Self-pay | Admitting: Obstetrics & Gynecology

## 2022-02-24 ENCOUNTER — Ambulatory Visit (HOSPITAL_BASED_OUTPATIENT_CLINIC_OR_DEPARTMENT_OTHER): Payer: Medicare Other | Admitting: Obstetrics & Gynecology

## 2022-02-24 VITALS — BP 128/71 | HR 69 | Ht 64.0 in | Wt 195.8 lb

## 2022-02-24 DIAGNOSIS — N952 Postmenopausal atrophic vaginitis: Secondary | ICD-10-CM

## 2022-02-24 DIAGNOSIS — Z90710 Acquired absence of both cervix and uterus: Secondary | ICD-10-CM | POA: Diagnosis not present

## 2022-02-24 DIAGNOSIS — Z780 Asymptomatic menopausal state: Secondary | ICD-10-CM | POA: Diagnosis not present

## 2022-02-24 MED ORDER — ESTRADIOL 10 MCG VA TABS: ORAL_TABLET | VAGINAL | 3 refills | Status: DC

## 2022-02-24 NOTE — Progress Notes (Signed)
71 y.o. G88P2002 Married White or Caucasian female here for new patient appointment.  Has some vaginal dryness and dyspareunia.  Has never tried any vaginal products.  H/o TAH due to fibroids.  Ovaries remained.  H/o cesarean section x 2 as well.  ? ?Denies vaginal bleeding. ? ?No LMP recorded. Patient has had a hysterectomy.          ?Sexually active: not vaginal intercourse ?H/O STD:  no ? ?Health Maintenance: ?PCP:  Dr. [name].  Last wellness appt was 10/2021.  Did blood work at that appt: yes ?Vaccines are up to date:  has not done shingles vaccines ?Colonoscopy:  pt thinks she had it about five years ago with Dr. Michail Sermon ?MMG:  done at [facility] ?BMD:  scheduled in 2 weeks ?Last pap smear:  10/17/2019 . Negative ?H/o abnormal pap smear:  no ? ? ? reports that she has quit smoking. Her smoking use included cigarettes. She has a 25.00 pack-year smoking history. She has never used smokeless tobacco. She reports current alcohol use. She reports that she does not use drugs. ? ?Past Medical History:  ?Diagnosis Date  ? Aneurysm of aorta (HCC)   ? Arthritis   ? Atrophic vaginitis   ? Cellulitis and abscess of right leg 05/07/2018  ? Depression   ? Diverticulitis   ? DYSPNEA 06/04/2009  ? Qualifier: Diagnosis of  By: Elsworth Soho MD, Leanna Sato.    ? Edema of both legs   ? She takes Lasix a couple of times per week to decrease swelling.    ? Fatty liver   ? Hyperlipidemia   ? Hypertension   ? Keratosis   ? hands and lower limbs  ? Lymphedema   ? legs  ? Memory difficulty   ? Obesity   ? Reflux   ? Shingles   ? Venous stasis   ? lower legs  ? ? ?Past Surgical History:  ?Procedure Laterality Date  ? ABDOMINAL HYSTERECTOMY    ? AUGMENTATION MAMMAPLASTY    ? Basal and Squamous cell cancers excised    ? CESAREAN SECTION    ? X 2  ? CYSTOSCOPY/RETROGRADE/URETEROSCOPY/STONE EXTRACTION WITH BASKET    ? removal couldnt blast  ? EYE SURGERY    ? bilateral cataract with lens implants  ? lipoma surgery    ? LIPOSUCTION    ? knees  ? TOTAL  KNEE ARTHROPLASTY Left 09/26/2018  ? Procedure: TOTAL KNEE ARTHROPLASTY;  Surgeon: Paralee Cancel, MD;  Location: [facility];  Service: Orthopedics;  Laterality: Left;  70 mins  ? tummy tuck    ? ? ?Current Outpatient Medications  ?Medication Sig Dispense Refill  ? ALPRAZolam (XANAX) 1 MG tablet Take 1 mg by mouth as needed.    ? amLODipine (NORVASC) 5 MG tablet Take 5 mg by mouth daily.    ? amphetamine-dextroamphetamine (ADDERALL) 10 MG tablet Take 10 mg by mouth daily with breakfast.    ? desvenlafaxine (PRISTIQ) 50 MG 24 hr tablet Take 50 mg by mouth daily.    ? fluticasone (FLONASE) 50 MCG/ACT nasal spray instill 2 sprays into each nostril once daily 16 g 0  ? loratadine (CLARITIN) 10 MG tablet Take 1 tablet (10 mg total) by mouth daily. 30 tablet 5  ? metoprolol succinate (TOPROL-XL) 50 MG 24 hr tablet TAKE ONE TABLET BY MOUTH ONE TIME DAILY  WITH OR IMMEDIATELY FOLLOWING A MEAL 90 tablet 1  ? pantoprazole (PROTONIX) 40 MG tablet take 1 tablet by mouth 1 hour before  eating twice a day until next office visit 60 tablet 0  ? polyethylene glycol (MIRALAX / GLYCOLAX) 17 g packet Take 17 g by mouth daily.    ? rosuvastatin (CRESTOR) 20 MG tablet TAKE ONE TABLET BY MOUTH ONE TIME DAILY 90 tablet 0  ? triamterene-hydrochlorothiazide (MAXZIDE-25) 37.5-25 MG per tablet Take 1 tablet by mouth daily.      ? ?No current facility-administered medications for this visit.  ? ? ?Family History  ?Problem Relation Age of Onset  ? Uterine cancer Mother   ? Heart disease Mother   ? Diabetes Mother   ? Hypertension Mother   ? Hyperlipidemia Mother   ? Alzheimer's disease Father   ? Hypertension Father   ? Cancer Paternal Grandmother   ?     Gallbladder cancer  ? Diverticulitis Paternal Grandmother   ?     gallbladder  ? Liver cancer Paternal Grandfather   ? Diverticulitis Paternal Grandfather   ? Diverticulitis Maternal Grandmother   ? Breast cancer Cousin   ?     Paternal 1st cousin Age 30  ? Depression Sister   ? Bipolar disorder  Sister   ? ? ?Review of Systems  ?Constitutional: Negative.   ?Genitourinary: Negative.   ? ?Exam:   ?BP 128/71 (BP Location: Right Arm, Patient Position: Sitting, Cuff Size: Large)   Pulse 69   Ht 5' 4"  (1.626 m) Comment: reported  Wt 195 lb 12.8 oz (88.8 kg)   BMI 33.61 kg/m?   Height: 5' 4"  (162.6 cm) (reported) ? ?General appearance: alert, cooperative and appears stated age ?Breasts: normal appearance, no masses or tenderness ?Abdomen: soft, non-tender; bowel sounds normal; no masses,  no organomegaly ?Lymph nodes: Cervical, supraclavicular, and axillary nodes normal.  No abnormal inguinal nodes palpated ?Neurologic: Grossly normal ? ?Pelvic: External genitalia:  no lesions ?             Urethra:  normal appearing urethra with no masses, tenderness or lesions ?             Bartholins and Skenes: normal    ?             Vagina: normal appearing vagina with atrophic changes and no discharge, no lesions ?             Cervix: absent ?             Pap taken: No. ?Bimanual Exam:  Uterus:  uterus absent ?             Adnexa: no mass, fullness, tenderness ?              Rectovaginal: Confirms ?              Anus:  normal sphincter tone, no lesions ? ?Chaperone, Octaviano Batty, CMA, was present for exam. ? ?Assessment/Plan: ?1. Postmenopausal ?- pap smears not recommended due to hx of TAh ?- MMG done at [facility].  Release signed today. ?- colonoscopy done with Dr. Michail Sermon.  I cannot tell exact date in Epic but appears to have been in 2018.  Pt reports she was advised to follow up in 10 years ?- PCP:  Dr. [name].  Last wellness appt was 10/2021.  Did blood work at that appt: yes ?- BMD is scheduled in two weeks  ?- vaccines reviewed.  Has not done shingrix yet. ? ?2. Vaginal atrophy ?- treatment options discussed.  Will try vagifem and topical coconut oil. ?- Estradiol (VAGIFEM) 10 MCG TABS  vaginal tablet; Place 1 tablet pv nightly x 14 days, then place one tablet pv twice weekly  Dispense: 42 tablet; Refill:  3 ? ?3. H/O: hysterectomy  ? ? ?

## 2022-02-25 ENCOUNTER — Ambulatory Visit: Payer: Medicare Other

## 2022-02-25 ENCOUNTER — Encounter (HOSPITAL_BASED_OUTPATIENT_CLINIC_OR_DEPARTMENT_OTHER): Payer: Self-pay | Admitting: Obstetrics & Gynecology

## 2022-03-02 ENCOUNTER — Encounter (HOSPITAL_BASED_OUTPATIENT_CLINIC_OR_DEPARTMENT_OTHER): Payer: Self-pay | Admitting: *Deleted

## 2022-03-06 ENCOUNTER — Encounter (HOSPITAL_BASED_OUTPATIENT_CLINIC_OR_DEPARTMENT_OTHER): Payer: Self-pay

## 2022-03-06 ENCOUNTER — Other Ambulatory Visit: Payer: Self-pay

## 2022-03-06 DIAGNOSIS — R059 Cough, unspecified: Secondary | ICD-10-CM | POA: Diagnosis present

## 2022-03-06 DIAGNOSIS — Z79899 Other long term (current) drug therapy: Secondary | ICD-10-CM | POA: Insufficient documentation

## 2022-03-06 DIAGNOSIS — U071 COVID-19: Secondary | ICD-10-CM | POA: Diagnosis not present

## 2022-03-06 DIAGNOSIS — R002 Palpitations: Secondary | ICD-10-CM | POA: Insufficient documentation

## 2022-03-06 LAB — RESP PANEL BY RT-PCR (FLU A&B, COVID) ARPGX2
Influenza A by PCR: NEGATIVE
Influenza B by PCR: NEGATIVE
SARS Coronavirus 2 by RT PCR: POSITIVE — AB

## 2022-03-06 NOTE — ED Triage Notes (Signed)
Patient here POV from Home.  Endorses Yesterday PM a notable Dry Cough. Associated with Headache, Subjective Fever, Nasal Drainage.  103.2 Fever today. No N/V/D.  NAD Noted during Triage. A&Ox4. GCS 15. Ambulatory.

## 2022-03-07 ENCOUNTER — Emergency Department (HOSPITAL_BASED_OUTPATIENT_CLINIC_OR_DEPARTMENT_OTHER): Payer: Medicare Other

## 2022-03-07 ENCOUNTER — Emergency Department (HOSPITAL_BASED_OUTPATIENT_CLINIC_OR_DEPARTMENT_OTHER)
Admission: EM | Admit: 2022-03-07 | Discharge: 2022-03-07 | Disposition: A | Discharge status: Home / Self Care | Payer: Medicare Other | Attending: Emergency Medicine | Admitting: Emergency Medicine

## 2022-03-07 DIAGNOSIS — R059 Cough, unspecified: Secondary | ICD-10-CM | POA: Diagnosis not present

## 2022-03-07 DIAGNOSIS — U071 COVID-19: Secondary | ICD-10-CM

## 2022-03-07 DIAGNOSIS — R002 Palpitations: Secondary | ICD-10-CM

## 2022-03-07 LAB — BASIC METABOLIC PANEL
Anion gap: 8 (ref 5–15)
BUN: 10 mg/dL (ref 8–23)
CO2: 30 mmol/L (ref 22–32)
Calcium: 8.9 mg/dL (ref 8.9–10.3)
Chloride: 101 mmol/L (ref 98–111)
Creatinine, Ser: 0.76 mg/dL (ref 0.44–1.00)
GFR, Estimated: >60 mL/min (ref >60)
Glucose, Bld: 123 mg/dL — ABNORMAL HIGH (ref 70–99)
Potassium: 3.8 mmol/L (ref 3.5–5.1)
Sodium: 139 mmol/L (ref 135–145)

## 2022-03-07 LAB — CBC
HCT: 44.5 % (ref 36.0–46.0)
Hemoglobin: 14.5 g/dL (ref 12.0–15.0)
MCH: 29.9 pg (ref 26.0–34.0)
MCHC: 32.6 g/dL (ref 30.0–36.0)
MCV: 91.8 fL (ref 80.0–100.0)
Platelets: 205 10*3/uL (ref 150–400)
RBC: 4.85 MIL/uL (ref 3.87–5.11)
RDW: 14 % (ref 11.5–15.5)
WBC: 5.5 10*3/uL (ref 4.0–10.5)
nRBC: 0 % (ref 0.0–0.2)

## 2022-03-07 MED ORDER — ACETAMINOPHEN 325 MG PO TABS
650.0000 mg | ORAL_TABLET | Freq: Once | ORAL | Status: AC
  Administered 2022-03-07: 650 mg via ORAL
  Filled 2022-03-07: qty 2

## 2022-03-07 MED ORDER — SODIUM CHLORIDE 0.9 % IV BOLUS
1000.0000 mL | Freq: Once | INTRAVENOUS | Status: AC
  Administered 2022-03-07: 1000 mL via INTRAVENOUS

## 2022-03-07 MED ORDER — NIRMATRELVIR/RITONAVIR (PAXLOVID)TABLET: 3.0000 | ORAL_TABLET | Freq: Two times a day (BID) | ORAL | 0 refills | Status: AC

## 2022-03-07 NOTE — Discharge Instructions (Addendum)
Please consider holding your Crestor/rosuvastatin while taking the COVID medication

## 2022-03-07 NOTE — ED Provider Notes (Signed)
[facility] EMERGENCY DEPT Provider Note   CSN: [id number] Arrival date & time: 03/06/22  2100     History  Chief Complaint  Patient presents with   Cough    Patricia [name] is a 71 y.o. female.  The history is provided by the patient.  Cough Severity:  Moderate Onset quality:  Gradual Progression:  Worsening Chronicity:  New Worsened by:  Nothing Associated symptoms: chills and fever   Patient reports over the past days she has had a dry cough, mild shortness of breath.  She has had fever and chills.  She reports headache.  She is now reporting her heart is fluttering.  No active chest pain.    Home Medications Prior to Admission medications   Medication Sig Start Date End Date Taking? Authorizing Provider  nirmatrelvir/ritonavir EUA (PAXLOVID) 20 x 150 MG & 10 x 100MG TABS Take 3 tablets by mouth 2 (two) times daily for 5 days. Patient GFR is 60. Take nirmatrelvir (150 mg) two tablets twice daily for 5 days and ritonavir (100 mg) one tablet twice daily for 5 days. 03/07/22 03/12/22 Yes Ripley Fraise, MD  ALPRAZolam Duanne Moron) 1 MG tablet Take 1 mg by mouth as needed. 02/23/21   [provider]  amLODipine (NORVASC) 5 MG tablet Take 5 mg by mouth daily.    [provider]  amphetamine-dextroamphetamine (ADDERALL) 10 MG tablet Take 10 mg by mouth daily with breakfast.    [provider]  desvenlafaxine (PRISTIQ) 50 MG 24 hr tablet Take 50 mg by mouth daily.    [provider]  Estradiol (VAGIFEM) 10 MCG TABS vaginal tablet Place 1 tablet pv nightly x 14 days, then place one tablet pv twice weekly 02/24/22   Megan Salon, MD  fluticasone [facility]) 50 MCG/ACT nasal spray instill 2 sprays into each nostril once daily 01/20/22   Collene Gobble, MD  loratadine (CLARITIN) 10 MG tablet Take 1 tablet (10 mg total) by mouth daily. 06/02/21   Collene Gobble, MD  metoprolol succinate (TOPROL-XL) 50 MG 24 hr tablet TAKE ONE TABLET BY MOUTH  ONE TIME DAILY  WITH OR IMMEDIATELY FOLLOWING A MEAL 12/23/21   Jerline Pain, MD  pantoprazole (PROTONIX) 40 MG tablet take 1 tablet by mouth 1 hour before eating twice a day until next office visit 10/30/21   Collene Gobble, MD  polyethylene glycol (MIRALAX / GLYCOLAX) 17 g packet Take 17 g by mouth daily.    [provider]  rosuvastatin (CRESTOR) 20 MG tablet TAKE ONE TABLET BY MOUTH ONE TIME DAILY 01/29/22   Jerline Pain, MD  triamterene-hydrochlorothiazide (MAXZIDE-25) 37.5-25 MG per tablet Take 1 tablet by mouth daily.      [provider]      Allergies    Ciprofloxacin, Clindamycin/lincomycin, Morphine, and Tetracyclines & related    Review of Systems   Review of Systems  Constitutional:  Positive for chills and fever.  Respiratory:  Positive for cough.    Physical Exam Updated Vital Signs BP 119/78 (BP Location: Right Arm)   Pulse (!) 102   Temp 98.8 F (37.1 C)   Resp 18   Ht 1.626 m (5' 4" )   Wt 88.8 kg   SpO2 98%   BMI 33.60 kg/m  Physical Exam CONSTITUTIONAL: Elderly and anxious HEAD: Normocephalic/atraumatic ENMT: Mucous membranes moist NECK: supple no meningeal signs CV: S1/S2 noted, no murmurs/rubs/gallops noted LUNGS: Lungs are clear to auscultation bilaterally, no apparent distress ABDOMEN: soft, nontender, no  rebound or guarding, bowel sounds noted throughout abdomen GU:no cva tenderness NEURO: Pt is awake/alert/appropriate, moves all extremitiesx4.  No facial droop.   EXTREMITIES: pulses normal/equal, full ROM SKIN: warm, color normal PSYCH: Anxious ED Results / Procedures / Treatments   Labs (all labs ordered are listed, but only abnormal results are displayed) Labs Reviewed  RESP PANEL BY RT-PCR (FLU A&B, COVID) ARPGX2 - Abnormal; Notable for the following components:      Result Value   SARS Coronavirus 2 by RT PCR POSITIVE (*)    All other components within normal limits  BASIC METABOLIC PANEL - Abnormal; Notable for the  following components:   Glucose, Bld 123 (*)    All other components within normal limits  CBC    EKG ED ECG REPORT   Date: 03/07/2022 0442am  Rate: 90  Rhythm: normal sinus rhythm  QRS Axis: normal  Intervals: normal  ST/T Wave abnormalities: normal  Conduction Disutrbances:none  Narrative Interpretation:   Old EKG Reviewed: unchanged  I have personally reviewed the EKG tracing and agree with the computerized printout as noted.   Radiology DG Chest Portable 1 View  Result Date: 03/07/2022 CLINICAL DATA:  71 year old female with history of cough. COVID positive patient. EXAM: PORTABLE CHEST 1 VIEW COMPARISON:  Chest x-ray 06/02/2021. FINDINGS: Lung volumes are normal. No consolidative airspace disease. No pleural effusions. No pneumothorax. No pulmonary nodule or mass noted. Pulmonary vasculature and the cardiomediastinal silhouette are within normal limits. Atherosclerosis in the thoracic aorta. Bilateral breast implants with capsular calcifications incidentally noted. IMPRESSION: 1.  No radiographic evidence of acute cardiopulmonary disease. 2. Aortic atherosclerosis. Electronically Signed   By: Vinnie Langton M.D.   On: 03/07/2022 05:48    Procedures Procedures    Medications Ordered in ED Medications  sodium chloride 0.9 % bolus 1,000 mL (0 mLs Intravenous Stopped 03/07/22 0606)  acetaminophen (TYLENOL) tablet 650 mg (650 mg Oral Given 03/07/22 0452)    ED Course/ Medical Decision Making/ A&P Clinical Course as of 03/07/22 5035  Sun Mar 07, 2022  0627 Patient feeling improved.  Labs revealed mild hyperglycemia but no other acute findings.  I personally have interpreted chest x-ray that is negative for acute disease.  There is no hypoxia to suggest respiratory failure.  Patient is safe for discharge home [DW]  [id number] After discussion of risk and benefits, patient would like to take Paxlovid.  She is use this previously without difficulty.  She was informed to hold her  rosuvastatin due to concerns for myopathy while taking PaxLOVID [DW]    Clinical Course User Index [DW] Ripley Fraise, MD                           Medical Decision Making Amount and/or Complexity of Data Reviewed Labs: ordered. Radiology: ordered. ECG/medicine tests: ordered.  Risk OTC drugs.  6:28 AM Patient presents with cough, fever and headache.  She is found to be positive for COVID-19.  She mentions having heart flutters and palpitations.  Patient appears anxious.  EKG reveals a normal sinus rhythm.  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  She is not reporting any significant chest pain at this time We will check chest x-ray, labs and reassess 6:28 AM Patient improved, no acute distress.  She is safe for discharge home.  No signs of pneumonia on x-ray       Final Clinical Impression(s) / ED  Diagnoses Final diagnoses:  COVID-19  Palpitations    Rx / DC Orders ED Discharge Orders          Ordered    nirmatrelvir/ritonavir EUA (PAXLOVID) 20 x 150 MG & 10 x 100MG TABS  2 times daily        03/07/22 9407              Ripley Fraise, MD 03/07/22 [id number]

## 2022-03-08 ENCOUNTER — Other Ambulatory Visit: Payer: Self-pay | Admitting: Advanced Practice Midwife

## 2022-03-08 DIAGNOSIS — Z13820 Encounter for screening for osteoporosis: Secondary | ICD-10-CM

## 2022-03-08 DIAGNOSIS — E2839 Other primary ovarian failure: Secondary | ICD-10-CM

## 2022-03-09 ENCOUNTER — Other Ambulatory Visit: Payer: Medicare Other

## 2022-03-22 ENCOUNTER — Ambulatory Visit: Payer: Medicare Other | Attending: Family Medicine

## 2022-03-22 DIAGNOSIS — R279 Unspecified lack of coordination: Secondary | ICD-10-CM | POA: Diagnosis not present

## 2022-03-22 DIAGNOSIS — M6281 Muscle weakness (generalized): Secondary | ICD-10-CM | POA: Insufficient documentation

## 2022-03-22 NOTE — Patient Instructions (Signed)
Double-voiding:  This technique is to help with post-void dribbling, or leaking a little bit when you stand up right after urinating.  Use relaxed toileting mechanics to urinate as much as you feel like you have to without straining.  Sit back upright from leaning forward and relax this way for 10-20 seconds.  Lean forward again to finish voiding any amount more.  Urge suppression technique: A technique to help you hold urine until it's an appropriate time to go, whether this is making it home or trying to reach a specific voiding time frame according to your schedule. It helps to send signals from the bladder to the brain that say you don't actually have to void urine right now. This most likely only give you several minutes of relief at first, but repeat as needed; the benefit will last longer as you use this technique more and get into better bladder habits. ?  The technique: o Perform 5 quick flicks (Kegels) rapidly, not worrying about fully relaxing in between each (only in this technique). o Then perform several deep belly breaths while focusing on relaxing the pelvic floor. o Go do something else to help distract yourself from the urge to urinate. o Repeat as needed.  Bladder retraining:  Drink 4-8oz an hour ONLY WATER Stop water intake 3 hours before bed Try to go at least 2-3 hours between trips to the bathroom - go whether you need to or not.  For two weeks.  [facility] [address] [place], [place] [address] Phone # [phone] Fax [phone]

## 2022-03-22 NOTE — Therapy (Signed)
OUTPATIENT PHYSICAL THERAPY TREATMENT NOTE   Patient Name: Patricia [name] MRN: [id number] DOB:[birth year], 71 y.o., female Today's Date: 03/22/2022  PCP: [name], MD REFERRING PROVIDER: [name], MD  END OF SESSION:   PT End of Session - 03/22/22 1530     Visit Number 2    Date for PT Re-Evaluation 04/16/22    Authorization Type BCBS Medicare    Progress Note Due on Visit 10    PT Start Time 0330    PT Stop Time 0410    PT Time Calculation (min) 40 min    Activity Tolerance Patient tolerated treatment well    Behavior During Therapy [facility] for tasks assessed/performed             Past Medical History:  Diagnosis Date   Aneurysm of aorta ([place])    Arthritis    Atrophic vaginitis    Cellulitis and abscess of right leg 05/07/2018   Depression    Diverticulitis    DYSPNEA 06/04/2009   Qualifier: Diagnosis of  By: Elsworth Soho MD, Leanna Sato.     Edema of both legs    She takes Lasix a couple of times per week to decrease swelling.     Fatty liver    Hyperlipidemia    Hypertension    Keratosis    hands and lower limbs   Lymphedema    legs   Memory difficulty    Obesity    Reflux    Shingles    Venous stasis    lower legs   Past Surgical History:  Procedure Laterality Date   ABDOMINAL HYSTERECTOMY     AUGMENTATION MAMMAPLASTY     Basal and Squamous cell cancers excised     CESAREAN SECTION     X 2   CYSTOSCOPY/RETROGRADE/URETEROSCOPY/STONE EXTRACTION WITH BASKET     removal couldnt blast   EYE SURGERY     bilateral cataract with lens implants   lipoma surgery     LIPOSUCTION     knees   TOTAL KNEE ARTHROPLASTY Left 09/26/2018   Procedure: TOTAL KNEE ARTHROPLASTY;  Surgeon: Paralee Cancel, MD;  Location: [facility];  Service: Orthopedics;  Laterality: Left;  70 mins   tummy tuck     Patient Active Problem List   Diagnosis Date Noted   Chronic venous insufficiency 07/14/2021   Chronic cough 06/02/2021   Allergic rhinitis 05/15/2019   Amnesia  05/15/2019   Bunion 05/15/2019   Colon, diverticulosis 05/15/2019   NASH (nonalcoholic steatohepatitis) 05/15/2019   Varicose veins of other specified sites 05/15/2019   Pain in thumb joint with movement of left hand 03/29/2019   Pain in thumb joint with movement, right 03/29/2019   S/P left TKA 09/26/2018   Osteoarthritis of left knee 09/06/2018   Fatigue 08/07/2018   Need for influenza vaccination 08/07/2018   Venous stasis syndrome 07/18/2018   Lymphedema of right lower extremity 05/29/2018   Cellulitis 05/07/2018   Cellulitis and abscess of right leg 05/07/2018   Pain in left knee 12/09/2017   Lipoma 10/29/2014   Encounter for consultation 11/06/2013   Essential hypertension, benign 02/26/2013   Obesity, unspecified 02/26/2013   Aneurysm of aorta ([place])    Depression    Fatty liver    Reflux    Atrophic vaginitis    DYSPNEA 06/04/2009   HYPERLIPIDEMIA 06/03/2009   HYPERTENSION 06/03/2009   MITRAL VALVE PROLAPSE 06/03/2009   Esophageal reflux 06/03/2009    REFERRING DIAG: R32 (ICD-10-CM) - Unspecified urinary incontinence  THERAPY  DIAG:  Muscle weakness (generalized)  Unspecified lack of coordination  Rationale for Evaluation and Treatment Rehabilitation  PERTINENT HISTORY: Diverticulosis  PRECAUTIONS: NA  SUBJECTIVE: Patient states that she has not been exercising the way that she should do to a very busy time over the last 6 weeks. She is still noticing some leakage on the way to the bathroom, especially at night when she wakes. She is noticing less leakage with coughing/laughing. Patient has been working on vulvovaginal massage.  PAIN:  Are you having pain? No   SUBJECTIVE:                                                                                                                                                                                            SUBJECTIVE STATEMENT 02/05/22: Pt states that she started having issues with urinary incontinence about  2 years ago with just a little leaking, and now it has gotten worse. She leaking with any cough/sneeze and when she wakes up in the middle of the night to go to the bathroom she will leak on the way to hte bathroom.  Fluid intake: Yes: 24oz of water, one cup of coffee each morning, once and a while alcohol.     Patient confirms identification and approves PT to assess pelvic floor and treatment Yes     PAIN:  Are you having pain? Yes- occasionally lower abdomen that feels like a lot of pressure     PRECAUTIONS: None   WEIGHT BEARING RESTRICTIONS No   FALLS:  Has patient fallen in last 6 months? 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   PLOF: Independent   PATIENT GOALS decrease leaking   PERTINENT HISTORY:  Diverticulosis Sexual abuse: No   BOWEL MOVEMENT Pain with bowel movement: No Type of bowel movement:Frequency 2x/day and Strain No Fully empty rectum: Yes: - Leakage: No Pads: No Fiber supplement: No   URINATION Pain with urination: No Fully empty bladder: No - she has to move around to completely empty bladder and sometimes post-void dribbling Stream:  WNL except for dribbles at the end Urgency: Yes: depends on when she last went how long she has to make it Frequency: 2-3 hours during the day and 2x/night Leakage: Urge to void, Coughing, Sneezing, and Laughing Pads: Yes: one/day   INTERCOURSE Pain with intercourse: Initial Penetration Ability to have vaginal penetration:  Yes: but states there is a lot of dryness Climax: yes     PREGNANCY Vaginal deliveries 2 Tearing No C-section deliveries 2  OBJECTIVE 02/04/22:      COGNITION:            Overall cognitive status: Within functional limits for tasks assessed                          SENSATION:            Light touch: Appears intact            Proprioception: Appears intact    PELVIC MMT: Strength 2/5 with poor coordination/isolation, 7 repetitions with increasing difficulty with isolation and coordination, endurance 3 seconds; mild tenderness throughout superficial muscles.          PALPATION:   General  mild tenderness in lower abdomen and pinpoint tenderness over pubic symphysis; no diastasis present but coning in upper abdominals with strength testing and weakness                 External Perineal Exam evidence of changes consistent with GSM                                        Internal Pelvic Floor very mild tenderness in superficial pelvic floor with dryness noted   TONE: WNL   PROLAPSE: None palpable - difficulty with coordination of bearing down   TODAY'S TREATMENT: Neuromuscular re-education: Core retraining:  Multimodal cues for improved transversus abdominus facilitation with breath coordination Core facilitation: Supine march 2 x 10 Leg extensions 10x bil Bridge with hip adduction 10 x 5 sec hold Self-care: Double voiding Urge suppression technique Strict bladder retraining   TREATMENT 02/05/22 EVAL  Neuromuscular re-education: Pelvic floor contraction training: Quick flicks 73X with multimodal cues for improved coordination with breathing Long holds 5 x 10 seconds  Self-care: Vulvovaginal massage Lubricant samples and using during intercourse Pelvic floor anatomy       PATIENT EDUCATION:  Education details: See above self-care; HEP progressions Person educated: Patient Education method: Explanation, Demonstration, Tactile cues, Verbal cues, and Handouts Education comprehension: verbalized understanding     HOME EXERCISE PROGRAM: [id number]   ASSESSMENT:   CLINICAL IMPRESSION: Patient has started seeing some very minor improvements in urinary incontinence.We discussed trying to perform more regular pelvic floor contractions. Patient education performed on urge suppression technique, strict bladder retraining for two  weeks, and double-voiding; patient agreeable to all of these lifestyle modifications and written instructions printed. She was able to begin core retraining and facilitation in other exercises to help challenge with good form and breath coordination. We discussed how core/hip strengthening is beneficial to pelvic floor and symptoms. She will benefit from skilled PT intervention in order to address impairments, decrease urinary incontinence, and improve QOL.      OBJECTIVE IMPAIRMENTS decreased activity tolerance, decreased coordination, decreased endurance, decreased mobility, decreased ROM, decreased strength, hypomobility, increased fascial restrictions, increased muscle spasms, and pain.    ACTIVITY LIMITATIONS  situations with increased abdominal pressure and intercourse .    PERSONAL FACTORS 1 comorbidity: 2 cesareans  are also affecting patient's functional outcome.      REHAB POTENTIAL: Good   CLINICAL DECISION MAKING: Stable/uncomplicated   EVALUATION COMPLEXITY: Low     GOALS: Goals reviewed with patient? Yes   SHORT TERM GOALS: Target date: 03/05/2022   Pt will be independent with HEP.    Baseline: Goal status: INITIAL   2.  Pt will be independent with the knack,  urge suppression technique, and double voiding in order to improve bladder habits and decrease urinary incontinence.    Baseline:  Goal status: INITIAL       LONG TERM GOALS: Target date: 04/16/22   Pt will be independent with advanced HEP.    Baseline:  Goal status: INITIAL   2.  Pt will demonstrate normal pelvic floor muscle tone and A/ROM, able to achieve 4/5 strength with contractions and 10 sec endurance, in order to provide appropriate lumbopelvic support in functional activities.    Baseline:  Goal status: INITIAL   3.  Pt will report no leaks with laughing, coughing, sneezing in order to improve comfort with interpersonal relationships and community activities.    Baseline:  Goal status:  INITIAL   4.  Pt will be able to go 2-3 hours in between voids without urgency or incontinence in order to improve QOL and perform all functional activities with less difficulty.    Baseline:  Goal status: INITIAL   5.  Pt will decrease frequency of nightly trips to the bathroom to 1 or less in order to get restful sleep.    Baseline:  Goal status: INITIAL   6.  Pt will report 0/10 pain with vaginal penetration in order to improve intimate relationship with partner.     Baseline:  Goal status: INITIAL   PLAN: PT FREQUENCY: 1x/week   PT DURATION: 10 weeks   PLANNED INTERVENTIONS: Therapeutic exercises, Therapeutic activity, Neuromuscular re-education, Balance training, Gait training, Patient/Family education, Joint mobilization, Dry Needling, Biofeedback, and Manual therapy   PLAN FOR NEXT SESSION: Possible return to internal vaginal assessment to see how coordination is progressing. Progress functional core/pelvic floor exercises.    Heather Roberts, PT, DPT06/05/234:16 PM

## 2022-03-28 DIAGNOSIS — J069 Acute upper respiratory infection, unspecified: Secondary | ICD-10-CM | POA: Diagnosis not present

## 2022-03-28 DIAGNOSIS — J42 Unspecified chronic bronchitis: Secondary | ICD-10-CM | POA: Diagnosis not present

## 2022-03-29 ENCOUNTER — Ambulatory Visit: Payer: Medicare Other

## 2022-04-05 ENCOUNTER — Ambulatory Visit: Payer: Medicare Other

## 2022-04-13 ENCOUNTER — Ambulatory Visit: Payer: Medicare Other

## 2022-04-13 DIAGNOSIS — R279 Unspecified lack of coordination: Secondary | ICD-10-CM

## 2022-04-13 DIAGNOSIS — M6281 Muscle weakness (generalized): Secondary | ICD-10-CM | POA: Diagnosis not present

## 2022-04-13 NOTE — Therapy (Signed)
OUTPATIENT PHYSICAL THERAPY TREATMENT NOTE   Patient Name: Patricia [name] MRN: [id number] DOB:1951-03-30, 71 y.o., female Today's Date: 04/13/2022  PCP: Lupita Raider, MD REFERRING PROVIDER: Lupita Raider, MD  END OF SESSION:   PT End of Session - 04/13/22 1117     Visit Number 3    Date for PT Re-Evaluation 06/08/22    Authorization Type BCBS Medicare    PT Start Time 1114    PT Stop Time 1145    PT Time Calculation (min) 31 min    Activity Tolerance Patient tolerated treatment well    Behavior During Therapy WFL for tasks assessed/performed              Past Medical History:  Diagnosis Date   Aneurysm of aorta ([facility])    Arthritis    Atrophic vaginitis    Cellulitis and abscess of right leg 05/07/2018   Depression    Diverticulitis    DYSPNEA 06/04/2009   Qualifier: Diagnosis of  By: Vassie Loll MD, Comer Locket.     Edema of both legs    She takes Lasix a couple of times per week to decrease swelling.     Fatty liver    Hyperlipidemia    Hypertension    Keratosis    hands and lower limbs   Lymphedema    legs   Memory difficulty    Obesity    Reflux    Shingles    Venous stasis    lower legs   Past Surgical History:  Procedure Laterality Date   ABDOMINAL HYSTERECTOMY     AUGMENTATION MAMMAPLASTY     Basal and Squamous cell cancers excised     CESAREAN SECTION     X 2   CYSTOSCOPY/RETROGRADE/URETEROSCOPY/STONE EXTRACTION WITH BASKET     removal couldnt blast   EYE SURGERY     bilateral cataract with lens implants   lipoma surgery     LIPOSUCTION     knees   TOTAL KNEE ARTHROPLASTY Left 09/26/2018   Procedure: TOTAL KNEE ARTHROPLASTY;  Surgeon: Durene Romans, MD;  Location: [facility];  Service: Orthopedics;  Laterality: Left;  70 mins   tummy tuck     Patient Active Problem List   Diagnosis Date Noted   Chronic venous insufficiency 07/14/2021   Chronic cough 06/02/2021   Allergic rhinitis 05/15/2019   Amnesia 05/15/2019   Bunion 05/15/2019    Colon, diverticulosis 05/15/2019   NASH (nonalcoholic steatohepatitis) 05/15/2019   Varicose veins of other specified sites 05/15/2019   Pain in thumb joint with movement of left hand 03/29/2019   Pain in thumb joint with movement, right 03/29/2019   S/P left TKA 09/26/2018   Osteoarthritis of left knee 09/06/2018   Fatigue 08/07/2018   Need for influenza vaccination 08/07/2018   Venous stasis syndrome 07/18/2018   Lymphedema of right lower extremity 05/29/2018   Cellulitis 05/07/2018   Cellulitis and abscess of right leg 05/07/2018   Pain in left knee 12/09/2017   Lipoma 10/29/2014   Encounter for consultation 11/06/2013   Essential hypertension, benign 02/26/2013   Obesity, unspecified 02/26/2013   Aneurysm of aorta ([facility])    Depression    Fatty liver    Reflux    Atrophic vaginitis    DYSPNEA 06/04/2009   HYPERLIPIDEMIA 06/03/2009   HYPERTENSION 06/03/2009   MITRAL VALVE PROLAPSE 06/03/2009   Esophageal reflux 06/03/2009    REFERRING DIAG: R32 (ICD-10-CM) - Unspecified urinary incontinence  THERAPY DIAG:  Muscle weakness (generalized)  Unspecified lack  of coordination  Rationale for Evaluation and Treatment Rehabilitation  PERTINENT HISTORY: Diverticulosis  PRECAUTIONS: NA  SUBJECTIVE: Patient states that she has been very sick and with all of the hard coughing she had a significant amount of leaking. She has been working on double-voiding and pelvic floor contractions on the toilet. She is still having issues with making it to the bathroom at night.   PAIN:  Are you having pain? No   SUBJECTIVE:                                                                                                                                                                                            SUBJECTIVE STATEMENT 02/05/22: Pt states that she started having issues with urinary incontinence about 2 years ago with just a little leaking, and now it has gotten worse. She leaking  with any cough/sneeze and when she wakes up in the middle of the night to go to the bathroom she will leak on the way to hte bathroom.  Fluid intake: Yes: 24oz of water, one cup of coffee each morning, once and a while alcohol.     Patient confirms identification and approves PT to assess pelvic floor and treatment Yes     PAIN:  Are you having pain? Yes- occasionally lower abdomen that feels like a lot of pressure     PRECAUTIONS: None   WEIGHT BEARING RESTRICTIONS No   FALLS:  Has patient fallen in last 6 months? 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   PLOF: Independent   PATIENT GOALS decrease leaking   PERTINENT HISTORY:  Diverticulosis Sexual abuse: No   BOWEL MOVEMENT Pain with bowel movement: No Type of bowel movement:Frequency 2x/day and Strain No Fully empty rectum: Yes: - Leakage: No Pads: No Fiber supplement: No   URINATION Pain with urination: No Fully empty bladder: No - she has to move around to completely empty bladder and sometimes post-void dribbling Stream:  WNL except for dribbles at the end Urgency: Yes: depends on when she last went how long she has to make it Frequency: 2-3 hours during the day and 2x/night Leakage: Urge to void, Coughing, Sneezing, and Laughing Pads: Yes: one/day   INTERCOURSE Pain with intercourse: Initial Penetration Ability to have vaginal penetration:  Yes: but states there is a lot of dryness Climax: yes     PREGNANCY Vaginal deliveries 2 Tearing No C-section deliveries 2     OBJECTIVE 04/13/22: Pelvic floor strength 2/5, endurance 7 seconds at 2/5  strength, 7 repetitions with moderate level of coordination; increased tissue sensitivity in superficial pelvic floor most consistent with mucosal thinning due to lack of tension; Rt sided levator ani discomfort again with lack of tissue tension and more  consistent with mucosal changes   02/04/22:      COGNITION:            Overall cognitive status: Within functional limits for tasks assessed                          SENSATION:            Light touch: Appears intact            Proprioception: Appears intact   PELVIC MMT: Strength 2/5 with poor coordination/isolation, 7 repetitions with increasing difficulty with isolation and coordination, endurance 3 seconds; mild tenderness throughout superficial muscles.          PALPATION:   General  mild tenderness in lower abdomen and pinpoint tenderness over pubic symphysis; no diastasis present but coning in upper abdominals with strength testing and weakness                 External Perineal Exam evidence of changes consistent with GSM                                        Internal Pelvic Floor very mild tenderness in superficial pelvic floor with dryness noted   TONE: WNL   PROLAPSE: None palpable - difficulty with coordination of bearing down   TODAY'S TREATMENT 04/13/22 Re-evaluation Manual: Internal pelvic floor techniques: No emotional/communication barriers or cognitive limitation. Patient is motivated to learn. Patient understands and agrees with treatment goals and plan. PT explains patient will be examined in standing, sitting, and lying down to see how their muscles and joints work. When they are ready, they will be asked to remove their underwear so PT can examine their perineum. The patient is also given the option of providing their own chaperone as one is not provided in our facility. The patient also has the right and is explained the right to defer or refuse any part of the evaluation or treatment including the internal exam. With the patient's consent, PT will use one gloved finger to gently assess the muscles of the pelvic floor, seeing how well it contracts and relaxes and if there is muscle symmetry. After, the patient will get dressed and PT and patient will discuss exam  findings and plan of care. PT and patient discuss plan of care, schedule, attendance policy and HEP activities. Internal vaginal reassessment of pelvic floor Neuromuscular re-education: Pelvic floor contraction training: Pelvic floor contraction training with breath coordination Self-care: Reviewed urge suppression technique Discussed not performing pelvic floor contractions while on the toilet - want to relax and void naturally at that time Reviewed anatomy and vulvovaginal massage   TREATMENT 03/22/22: Neuromuscular re-education: Core retraining:  Multimodal cues for improved transversus abdominus facilitation with breath coordination Core facilitation: Supine march 2 x 10 Leg extensions 10x bil Bridge with hip adduction 10 x 5 sec hold Self-care: Double voiding Urge suppression technique Strict bladder retraining   TREATMENT 02/05/22 EVAL  Neuromuscular re-education: Pelvic floor contraction training: Quick flicks 10x with multimodal cues for improved coordination with breathing Long holds 5 x 10 seconds  Self-care: Vulvovaginal massage Lubricant samples and using during intercourse Pelvic  floor anatomy       PATIENT EDUCATION:  Education details: See above self-care; HEP progressions Person educated: Patient Education method: Explanation, Demonstration, Tactile cues, Verbal cues, and Handouts Education comprehension: verbalized understanding     HOME EXERCISE PROGRAM: [id number]   ASSESSMENT:   CLINICAL IMPRESSION: Patient has been very sick again over the last several weeks and has not seen any improvements in condition, and possibly even worsening condition. She has been attempting some of her pelvic floor exercises while urinating on toilet; we discussed that this is not good for healthy urinating habits and pelvic floor strengthening due to training tightness at inappropriate time; she was encouraged to perform at any other times. Internal vaginal reassessment  performed of pelvic floor today with some tenderness throughout superficial/deep layers (Rt>LT) that was most consistent with mucosal changes associated with decrease in estrogen; strength was 2/5, endurance 7 seconds, and repetitions with moderate coordination 7 times. She did have difficulty with isolating pelvic floor contraction to correct muscles without contracting bil hip/core muscles. She discussed using finger for feedback during pelvic floor contractions to help with motor control as well as focus vulvovaginal massage further into vaginal canal to help with tenderness. We reviewed urge suppression technique and she was encouraged to utilize to help get the the bathroom with less leaking - will plan to incorporate this into bladder retraining next visit. She does state that she would like to continue PT and focus on getting better now that life has settled down and she is not sick anymore. She will benefit from skilled PT intervention in order to address impairments, decrease urinary incontinence, and improve QOL.      OBJECTIVE IMPAIRMENTS decreased activity tolerance, decreased coordination, decreased endurance, decreased mobility, decreased ROM, decreased strength, hypomobility, increased fascial restrictions, increased muscle spasms, and pain.    ACTIVITY LIMITATIONS  situations with increased abdominal pressure and intercourse .    PERSONAL FACTORS 1 comorbidity: 2 cesareans  are also affecting patient's functional outcome.      REHAB POTENTIAL: Good   CLINICAL DECISION MAKING: Stable/uncomplicated   EVALUATION COMPLEXITY: Low     GOALS: Goals reviewed with patient? Yes   SHORT TERM GOALS: Target date: 03/05/2022 - updated 04/13/22   Pt will be independent with HEP.    Baseline: Goal status: MET 04/13/22   2.  Pt will be independent with the knack, urge suppression technique, and double voiding in order to improve bladder habits and decrease urinary incontinence.    Baseline:   Goal status: IN PROGRESS       LONG TERM GOALS: Target date: 04/16/22 - updated 04/13/22   Pt will be independent with advanced HEP.    Baseline:  Goal status: IN PROGRESS   2.  Pt will demonstrate normal pelvic floor muscle tone and A/ROM, able to achieve 4/5 strength with contractions and 10 sec endurance, in order to provide appropriate lumbopelvic support in functional activities.    Baseline: strength 2/5, endurance 7 seconds Goal status: IN PROGRESS   3.  Pt will report no leaks with laughing, coughing, sneezing in order to improve comfort with interpersonal relationships and community activities.    Baseline: been worse due to being sick Goal status:IN PROGRESS   4.  Pt will be able to go 2-3 hours in between voids without urgency or incontinence in order to improve QOL and perform all functional activities with less difficulty.    Baseline: still having urgency and leaking on way to bathroom Goal status: IN  PROGRESS   5.  Pt will decrease frequency of nightly trips to the bathroom to 1 or less in order to get restful sleep.    Baseline:  Goal status: IN PROGRESS   6.  Pt will report 0/10 pain with vaginal penetration in order to improve intimate relationship with partner.     Baseline:  Goal status: IN PROGRESS   PLAN: PT FREQUENCY: 1x/week   PT DURATION: 10 weeks   PLANNED INTERVENTIONS: Therapeutic exercises, Therapeutic activity, Neuromuscular re-education, Balance training, Gait training, Patient/Family education, Joint mobilization, Dry Needling, Biofeedback, and Manual therapy   PLAN FOR NEXT SESSION: Possible return to internal vaginal assessment to see how coordination is progressing. Progress functional core/pelvic floor exercises.    Julio Alm, PT, DPT06/27/231:19 PM

## 2022-04-22 ENCOUNTER — Ambulatory Visit: Payer: Medicare Other | Attending: Family Medicine

## 2022-04-22 DIAGNOSIS — M6281 Muscle weakness (generalized): Secondary | ICD-10-CM | POA: Diagnosis not present

## 2022-04-22 DIAGNOSIS — R279 Unspecified lack of coordination: Secondary | ICD-10-CM | POA: Diagnosis not present

## 2022-04-22 NOTE — Therapy (Signed)
OUTPATIENT PHYSICAL THERAPY TREATMENT NOTE   Patient Name: Patricia [name] MRN: [id number] DOB:[birth year], 71 y.o., female Today's Date: 04/22/2022  PCP: [name], MD REFERRING PROVIDER: [name], MD  END OF SESSION:   PT End of Session - 04/22/22 1148     Visit Number 4    Date for PT Re-Evaluation 06/08/22    Authorization Type [employer] Medicare    Progress Note Due on Visit 10    PT Start Time 1145    PT Stop Time 1225    PT Time Calculation (min) 40 min    Activity Tolerance Patient tolerated treatment well    Behavior During Therapy WFL for tasks assessed/performed               Past Medical History:  Diagnosis Date   Aneurysm of aorta ([place])    Arthritis    Atrophic vaginitis    Cellulitis and abscess of right leg 05/07/2018   Depression    Diverticulitis    DYSPNEA 06/04/2009   Qualifier: Diagnosis of  By: Elsworth Soho MD, Leanna Sato.     Edema of both legs    She takes Lasix a couple of times per week to decrease swelling.     Fatty liver    Hyperlipidemia    Hypertension    Keratosis    hands and lower limbs   Lymphedema    legs   Memory difficulty    Obesity    Reflux    Shingles    Venous stasis    lower legs   Past Surgical History:  Procedure Laterality Date   ABDOMINAL HYSTERECTOMY     AUGMENTATION MAMMAPLASTY     Basal and Squamous cell cancers excised     CESAREAN SECTION     X 2   CYSTOSCOPY/RETROGRADE/URETEROSCOPY/STONE EXTRACTION WITH BASKET     removal couldnt blast   EYE SURGERY     bilateral cataract with lens implants   lipoma surgery     LIPOSUCTION     knees   TOTAL KNEE ARTHROPLASTY Left 09/26/2018   Procedure: TOTAL KNEE ARTHROPLASTY;  Surgeon: Paralee Cancel, MD;  Location: [facility];  Service: Orthopedics;  Laterality: Left;  70 mins   tummy tuck     Patient Active Problem List   Diagnosis Date Noted   Chronic venous insufficiency 07/14/2021   Chronic cough 06/02/2021   Allergic rhinitis 05/15/2019   Amnesia  05/15/2019   Bunion 05/15/2019   Colon, diverticulosis 05/15/2019   NASH (nonalcoholic steatohepatitis) 05/15/2019   Varicose veins of other specified sites 05/15/2019   Pain in thumb joint with movement of left hand 03/29/2019   Pain in thumb joint with movement, right 03/29/2019   S/P left TKA 09/26/2018   Osteoarthritis of left knee 09/06/2018   Fatigue 08/07/2018   Need for influenza vaccination 08/07/2018   Venous stasis syndrome 07/18/2018   Lymphedema of right lower extremity 05/29/2018   Cellulitis 05/07/2018   Cellulitis and abscess of right leg 05/07/2018   Pain in left knee 12/09/2017   Lipoma 10/29/2014   Encounter for consultation 11/06/2013   Essential hypertension, benign 02/26/2013   Obesity, unspecified 02/26/2013   Aneurysm of aorta ([place])    Depression    Fatty liver    Reflux    Atrophic vaginitis    DYSPNEA 06/04/2009   HYPERLIPIDEMIA 06/03/2009   HYPERTENSION 06/03/2009   MITRAL VALVE PROLAPSE 06/03/2009   Esophageal reflux 06/03/2009    REFERRING DIAG: R32 (ICD-10-CM) - Unspecified urinary incontinence  THERAPY DIAG:  Muscle weakness (generalized)  Unspecified lack of coordination  Rationale for Evaluation and Treatment Rehabilitation  PERTINENT HISTORY: Diverticulosis  PRECAUTIONS: NA  SUBJECTIVE: Pt states that she has been getting discharge from what she feels like is from the urethra for several weeks, but has recently started having itching start. She states that she gave into the itching and then the discharge spread to her vagina. She has been self-treating with some creams that she had. She has physical next week and plans to ask about symptoms.   She states that incontinence is significantly improved and she has not had a leak since her last visit. She is still waking up at night 1x/night, but is not leaking when she does get up to go. She feels like urge suppression technique is very helpful.   PAIN:  Are you having pain?  No   SUBJECTIVE:                                                                                                                                                                                            SUBJECTIVE STATEMENT 02/05/22: Pt states that she started having issues with urinary incontinence about 2 years ago with just a little leaking, and now it has gotten worse. She leaking with any cough/sneeze and when she wakes up in the middle of the night to go to the bathroom she will leak on the way to hte bathroom.  Fluid intake: Yes: 24oz of water, one cup of coffee each morning, once and a while alcohol.     Patient confirms identification and approves PT to assess pelvic floor and treatment Yes     PAIN:  Are you having pain? Yes- occasionally lower abdomen that feels like a lot of pressure     PRECAUTIONS: None   WEIGHT BEARING RESTRICTIONS No   FALLS:  Has patient fallen in last 6 months? 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   PLOF: Independent   PATIENT GOALS decrease leaking   PERTINENT HISTORY:  Diverticulosis Sexual abuse: No   BOWEL MOVEMENT Pain with bowel movement: No Type of bowel movement:Frequency 2x/day and Strain No Fully empty rectum: Yes: - Leakage: No Pads: No Fiber supplement: No   URINATION Pain with urination: No Fully empty bladder: No - she has to move around to completely empty bladder and sometimes post-void dribbling Stream:  WNL except for dribbles at the end Urgency: Yes: depends on when she last went how long she has to make  it Frequency: 2-3 hours during the day and 2x/night Leakage: Urge to void, Coughing, Sneezing, and Laughing Pads: Yes: one/day   INTERCOURSE Pain with intercourse: Initial Penetration Ability to have vaginal penetration:  Yes: but states there is a lot of dryness Climax: yes      PREGNANCY Vaginal deliveries 2 Tearing No C-section deliveries 2     OBJECTIVE 04/13/22: Pelvic floor strength 2/5, endurance 7 seconds at 2/5 strength, 7 repetitions with moderate level of coordination; increased tissue sensitivity in superficial pelvic floor most consistent with mucosal thinning due to lack of tension; Rt sided levator ani discomfort again with lack of tissue tension and more consistent with mucosal changes   02/04/22:      COGNITION:            Overall cognitive status: Within functional limits for tasks assessed                          SENSATION:            Light touch: Appears intact            Proprioception: Appears intact   PELVIC MMT: Strength 2/5 with poor coordination/isolation, 7 repetitions with increasing difficulty with isolation and coordination, endurance 3 seconds; mild tenderness throughout superficial muscles.          PALPATION:   General  mild tenderness in lower abdomen and pinpoint tenderness over pubic symphysis; no diastasis present but coning in upper abdominals with strength testing and weakness                 External Perineal Exam evidence of changes consistent with GSM                                        Internal Pelvic Floor very mild tenderness in superficial pelvic floor with dryness noted   TONE: WNL   PROLAPSE: None palpable - difficulty with coordination of bearing down   TODAY'S TREATMENT 04/22/22 Neuromuscular re-education: Core retraining:  Transversus abdominus training with multimodal cues for improved motor control and breath coordination Core facilitation: Supine march 2 x 10  Leg extensions 10x bil Exercises: Strengthening: Seated clam shell, blue band 2 x 10 Bridge with ball squeeze Self-care: Reviewed bladder retraining Reviewed pelvic floor anatomy and that she should see MD for continuation of vaginal itching/discharge symptoms   TREATMENT 04/13/22 Re-evaluation Manual: Internal pelvic floor  techniques: No emotional/communication barriers or cognitive limitation. Patient is motivated to learn. Patient understands and agrees with treatment goals and plan. PT explains patient will be examined in standing, sitting, and lying down to see how their muscles and joints work. When they are ready, they will be asked to remove their underwear so PT can examine their perineum. The patient is also given the option of providing their own chaperone as one is not provided in our facility. The patient also has the right and is explained the right to defer or refuse any part of the evaluation or treatment including the internal exam. With the patient's consent, PT will use one gloved finger to gently assess the muscles of the pelvic floor, seeing how well it contracts and relaxes and if there is muscle symmetry. After, the patient will get dressed and PT and patient will discuss exam findings and plan of care. PT and patient discuss plan of care, schedule, attendance  policy and HEP activities. Internal vaginal reassessment of pelvic floor Neuromuscular re-education: Pelvic floor contraction training: Pelvic floor contraction training with breath coordination Self-care: Reviewed urge suppression technique Discussed not performing pelvic floor contractions while on the toilet - want to relax and void naturally at that time Reviewed anatomy and vulvovaginal massage   TREATMENT 03/22/22: Neuromuscular re-education: Core retraining:  Multimodal cues for improved transversus abdominus facilitation with breath coordination Core facilitation: Supine march 2 x 10 Leg extensions 10x bil Bridge with hip adduction 10 x 5 sec hold Self-care: Double voiding Urge suppression technique Strict bladder retraining         PATIENT EDUCATION:  Education details: Exercise progressions Person educated: Patient Education method: Consulting civil engineer, Demonstration, Tactile cues, Verbal cues, and Handouts Education  comprehension: verbalized understanding     HOME EXERCISE PROGRAM: [id number]   ASSESSMENT:   CLINICAL IMPRESSION: Patient has seen excellent progress in the last week with large reduction in stress urinary incontinence, nocturia/leaking at night, and urgency, demonstrating good improvements with pelvic floor motor control. Since she has not been performing any core exercises, we reviewed core activation and initial exercises with this; she demonstrates difficulty with some abdominal bracing, but good improvements to good pelvic floor/transversus abdominus facilitation with cues and breath coordination; appropriate challenge with all exercises. She will continue to benefit from skilled PT intervention in order to address impairments, decrease urinary incontinence, and improve QOL.      OBJECTIVE IMPAIRMENTS decreased activity tolerance, decreased coordination, decreased endurance, decreased mobility, decreased ROM, decreased strength, hypomobility, increased fascial restrictions, increased muscle spasms, and pain.    ACTIVITY LIMITATIONS  situations with increased abdominal pressure and intercourse .    PERSONAL FACTORS 1 comorbidity: 2 cesareans  are also affecting patient's functional outcome.      REHAB POTENTIAL: Good   CLINICAL DECISION MAKING: Stable/uncomplicated   EVALUATION COMPLEXITY: Low     GOALS: Goals reviewed with patient? Yes   SHORT TERM GOALS: Target date: 03/05/2022 - updated 04/13/22   Pt will be independent with HEP.    Baseline: Goal status: MET 04/13/22   2.  Pt will be independent with the knack, urge suppression technique, and double voiding in order to improve bladder habits and decrease urinary incontinence.    Baseline:  Goal status: IN PROGRESS       LONG TERM GOALS: Target date: 04/16/22 - updated 04/13/22   Pt will be independent with advanced HEP.    Baseline:  Goal status: IN PROGRESS   2.  Pt will demonstrate normal pelvic floor muscle tone  and A/ROM, able to achieve 4/5 strength with contractions and 10 sec endurance, in order to provide appropriate lumbopelvic support in functional activities.    Baseline: strength 2/5, endurance 7 seconds Goal status: IN PROGRESS   3.  Pt will report no leaks with laughing, coughing, sneezing in order to improve comfort with interpersonal relationships and community activities.    Baseline: been worse due to being sick Goal status:IN PROGRESS   4.  Pt will be able to go 2-3 hours in between voids without urgency or incontinence in order to improve QOL and perform all functional activities with less difficulty.    Baseline: still having urgency and leaking on way to bathroom Goal status: IN PROGRESS   5.  Pt will decrease frequency of nightly trips to the bathroom to 1 or less in order to get restful sleep.    Baseline:  Goal status: IN PROGRESS   6.  Pt will  report 0/10 pain with vaginal penetration in order to improve intimate relationship with partner.     Baseline:  Goal status: IN PROGRESS   PLAN: PT FREQUENCY: 1x/week   PT DURATION: 10 weeks   PLANNED INTERVENTIONS: Therapeutic exercises, Therapeutic activity, Neuromuscular re-education, Balance training, Gait training, Patient/Family education, Joint mobilization, Dry Needling, Biofeedback, and Manual therapy   PLAN FOR NEXT SESSION: Progress functional core/pelvic floor exercises.    Heather Roberts, PT, DPT07/03/2311:25 PM

## 2022-04-25 ENCOUNTER — Other Ambulatory Visit: Payer: Self-pay | Admitting: Cardiology

## 2022-04-29 DIAGNOSIS — I10 Essential (primary) hypertension: Secondary | ICD-10-CM | POA: Diagnosis not present

## 2022-04-29 DIAGNOSIS — R945 Abnormal results of liver function studies: Secondary | ICD-10-CM | POA: Diagnosis not present

## 2022-04-29 DIAGNOSIS — R41840 Attention and concentration deficit: Secondary | ICD-10-CM | POA: Diagnosis not present

## 2022-04-29 DIAGNOSIS — F3342 Major depressive disorder, recurrent, in full remission: Secondary | ICD-10-CM | POA: Diagnosis not present

## 2022-04-29 DIAGNOSIS — N760 Acute vaginitis: Secondary | ICD-10-CM | POA: Diagnosis not present

## 2022-04-29 DIAGNOSIS — Z0000 Encounter for general adult medical examination without abnormal findings: Secondary | ICD-10-CM | POA: Diagnosis not present

## 2022-04-30 DIAGNOSIS — M1711 Unilateral primary osteoarthritis, right knee: Secondary | ICD-10-CM | POA: Diagnosis not present

## 2022-05-03 ENCOUNTER — Ambulatory Visit: Payer: Medicare Other

## 2022-05-03 DIAGNOSIS — M6281 Muscle weakness (generalized): Secondary | ICD-10-CM | POA: Diagnosis not present

## 2022-05-03 DIAGNOSIS — R279 Unspecified lack of coordination: Secondary | ICD-10-CM

## 2022-05-03 NOTE — Therapy (Signed)
OUTPATIENT PHYSICAL THERAPY TREATMENT NOTE   Patient Name: Patricia [name] MRN: [id number] DOB:[birth year], 71 y.o., female Today's Date: 05/03/2022  PCP: [name], MD REFERRING PROVIDER: [name], MD  END OF SESSION:   PT End of Session - 05/03/22 1105     Visit Number 5    Date for PT Re-Evaluation 06/08/22    Authorization Type BCBS Medicare    Progress Note Due on Visit 10    PT Start Time 1100    PT Stop Time 1140    PT Time Calculation (min) 40 min    Activity Tolerance Patient tolerated treatment well    Behavior During Therapy WFL for tasks assessed/performed                Past Medical History:  Diagnosis Date   Aneurysm of aorta ([place])    Arthritis    Atrophic vaginitis    Cellulitis and abscess of right leg 05/07/2018   Depression    Diverticulitis    DYSPNEA 06/04/2009   Qualifier: Diagnosis of  By: Elsworth Soho MD, Leanna Sato.     Edema of both legs    She takes Lasix a couple of times per week to decrease swelling.     Fatty liver    Hyperlipidemia    Hypertension    Keratosis    hands and lower limbs   Lymphedema    legs   Memory difficulty    Obesity    Reflux    Shingles    Venous stasis    lower legs   Past Surgical History:  Procedure Laterality Date   ABDOMINAL HYSTERECTOMY     AUGMENTATION MAMMAPLASTY     Basal and Squamous cell cancers excised     CESAREAN SECTION     X 2   CYSTOSCOPY/RETROGRADE/URETEROSCOPY/STONE EXTRACTION WITH BASKET     removal couldnt blast   EYE SURGERY     bilateral cataract with lens implants   lipoma surgery     LIPOSUCTION     knees   TOTAL KNEE ARTHROPLASTY Left 09/26/2018   Procedure: TOTAL KNEE ARTHROPLASTY;  Surgeon: Paralee Cancel, MD;  Location: [facility];  Service: Orthopedics;  Laterality: Left;  70 mins   tummy tuck     Patient Active Problem List   Diagnosis Date Noted   Chronic venous insufficiency 07/14/2021   Chronic cough 06/02/2021   Allergic rhinitis 05/15/2019   Amnesia  05/15/2019   Bunion 05/15/2019   Colon, diverticulosis 05/15/2019   NASH (nonalcoholic steatohepatitis) 05/15/2019   Varicose veins of other specified sites 05/15/2019   Pain in thumb joint with movement of left hand 03/29/2019   Pain in thumb joint with movement, right 03/29/2019   S/P left TKA 09/26/2018   Osteoarthritis of left knee 09/06/2018   Fatigue 08/07/2018   Need for influenza vaccination 08/07/2018   Venous stasis syndrome 07/18/2018   Lymphedema of right lower extremity 05/29/2018   Cellulitis 05/07/2018   Cellulitis and abscess of right leg 05/07/2018   Pain in left knee 12/09/2017   Lipoma 10/29/2014   Encounter for consultation 11/06/2013   Essential hypertension, benign 02/26/2013   Obesity, unspecified 02/26/2013   Aneurysm of aorta ([place])    Depression    Fatty liver    Reflux    Atrophic vaginitis    DYSPNEA 06/04/2009   HYPERLIPIDEMIA 06/03/2009   HYPERTENSION 06/03/2009   MITRAL VALVE PROLAPSE 06/03/2009   Esophageal reflux 06/03/2009    REFERRING DIAG: R32 (ICD-10-CM) - Unspecified urinary  incontinence  THERAPY DIAG:  Muscle weakness (generalized)  Unspecified lack of coordination  Rationale for Evaluation and Treatment Rehabilitation  PERTINENT HISTORY: Diverticulosis  PRECAUTIONS: NA  SUBJECTIVE: Pt reports no accident in the last week. She states that she is very surprised and excited. She was even able to sneeze without any issues.   PAIN:  Are you having pain? No   SUBJECTIVE:                                                                                                                                                                                            SUBJECTIVE STATEMENT 02/05/22: Pt states that she started having issues with urinary incontinence about 2 years ago with just a little leaking, and now it has gotten worse. She leaking with any cough/sneeze and when she wakes up in the middle of the night to go to the bathroom she  will leak on the way to hte bathroom.  Fluid intake: Yes: 24oz of water, one cup of coffee each morning, once and a while alcohol.     Patient confirms identification and approves PT to assess pelvic floor and treatment Yes     PAIN:  Are you having pain? Yes- occasionally lower abdomen that feels like a lot of pressure     PRECAUTIONS: None   WEIGHT BEARING RESTRICTIONS No   FALLS:  Has patient fallen in last 6 months? 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   PLOF: Independent   PATIENT GOALS decrease leaking   PERTINENT HISTORY:  Diverticulosis Sexual abuse: No   BOWEL MOVEMENT Pain with bowel movement: No Type of bowel movement:Frequency 2x/day and Strain No Fully empty rectum: Yes: - Leakage: No Pads: No Fiber supplement: No   URINATION Pain with urination: No Fully empty bladder: No - she has to move around to completely empty bladder and sometimes post-void dribbling Stream:  WNL except for dribbles at the end Urgency: Yes: depends on when she last went how long she has to make it Frequency: 2-3 hours during the day and 2x/night Leakage: Urge to void, Coughing, Sneezing, and Laughing Pads: Yes: one/day   INTERCOURSE Pain with intercourse: Initial Penetration Ability to have vaginal penetration:  Yes: but states there is a lot of dryness Climax: yes     PREGNANCY Vaginal deliveries 2 Tearing No C-section deliveries 2     OBJECTIVE 04/13/22: Pelvic floor strength 2/5, endurance 7 seconds at 2/5 strength, 7 repetitions with moderate level of coordination; increased tissue sensitivity  in superficial pelvic floor most consistent with mucosal thinning due to lack of tension; Rt sided levator ani discomfort again with lack of tissue tension and more consistent with mucosal changes   02/04/22:      COGNITION:            Overall cognitive  status: Within functional limits for tasks assessed                          SENSATION:            Light touch: Appears intact            Proprioception: Appears intact   PELVIC MMT: Strength 2/5 with poor coordination/isolation, 7 repetitions with increasing difficulty with isolation and coordination, endurance 3 seconds; mild tenderness throughout superficial muscles.          PALPATION:   General  mild tenderness in lower abdomen and pinpoint tenderness over pubic symphysis; no diastasis present but coning in upper abdominals with strength testing and weakness                 External Perineal Exam evidence of changes consistent with GSM                                        Internal Pelvic Floor very mild tenderness in superficial pelvic floor with dryness noted   TONE: WNL   PROLAPSE: None palpable - difficulty with coordination of bearing down   TODAY'S TREATMENT 05/03/22 Neuromuscular re-education: Core facilitation: Leg extension 10x bil Resisted march with blue band - cues to reduce posterior lean and breath coordination, 2 x 10 blue band Horizontal abduction 2 x 10, green band Exercises: Stretches/mobility: Piriformis stretch 60 sec bil supine/seated  Strengthening: Seated clam shell, blue band 2 x 10 Bridge with ball squeeze 2 x 10 Squats 2 x 10   TREATMENT 04/22/22 Neuromuscular re-education: Core retraining:  Transversus abdominus training with multimodal cues for improved motor control and breath coordination Core facilitation: Supine march 2 x 10  Leg extensions 10x bil Exercises: Strengthening: Seated clam shell, blue band 2 x 10 Bridge with ball squeeze Self-care: Reviewed bladder retraining Reviewed pelvic floor anatomy and that she should see MD for continuation of vaginal itching/discharge symptoms   TREATMENT 04/13/22 Re-evaluation Manual: Internal pelvic floor techniques: No emotional/communication barriers or cognitive limitation. Patient  is motivated to learn. Patient understands and agrees with treatment goals and plan. PT explains patient will be examined in standing, sitting, and lying down to see how their muscles and joints work. When they are ready, they will be asked to remove their underwear so PT can examine their perineum. The patient is also given the option of providing their own chaperone as one is not provided in our facility. The patient also has the right and is explained the right to defer or refuse any part of the evaluation or treatment including the internal exam. With the patient's consent, PT will use one gloved finger to gently assess the muscles of the pelvic floor, seeing how well it contracts and relaxes and if there is muscle symmetry. After, the patient will get dressed and PT and patient will discuss exam findings and plan of care. PT and patient discuss plan of care, schedule, attendance policy and HEP activities. Internal vaginal reassessment of pelvic floor Neuromuscular re-education: Pelvic floor contraction training: Pelvic  floor contraction training with breath coordination Self-care: Reviewed urge suppression technique Discussed not performing pelvic floor contractions while on the toilet - want to relax and void naturally at that time Reviewed anatomy and vulvovaginal massage        PATIENT EDUCATION:  Education details: Exercise progressions Person educated: Patient Education method: Explanation, Demonstration, Tactile cues, Verbal cues, and Handouts Education comprehension: verbalized understanding     HOME EXERCISE PROGRAM: [id number]   ASSESSMENT:   CLINICAL IMPRESSION: Patient doing extremely well with no episodes of urinary incontinence in the last week. She is feeling confident in improvements. We reviewed previous progressions with some modifications and clarifications to help with form and improved performance; good tolerance to addition of core/pelvic floor facilitation  exercises. She did very well with squat training and how to incorporate pelvic floor for more functional strengthening; she does have Bil knee pain, but not during any exercise today and we discussed how strengthening through these movements is very beneficial. She will continue to benefit from skilled PT intervention in order to address impairments, decrease urinary incontinence, and improve QOL.      OBJECTIVE IMPAIRMENTS decreased activity tolerance, decreased coordination, decreased endurance, decreased mobility, decreased ROM, decreased strength, hypomobility, increased fascial restrictions, increased muscle spasms, and pain.    ACTIVITY LIMITATIONS  situations with increased abdominal pressure and intercourse .    PERSONAL FACTORS 1 comorbidity: 2 cesareans  are also affecting patient's functional outcome.      REHAB POTENTIAL: Good   CLINICAL DECISION MAKING: Stable/uncomplicated   EVALUATION COMPLEXITY: Low     GOALS: Goals reviewed with patient? Yes   SHORT TERM GOALS: Target date: 03/05/2022 - updated 04/13/22   Pt will be independent with HEP.    Baseline: Goal status: MET 04/13/22   2.  Pt will be independent with the knack, urge suppression technique, and double voiding in order to improve bladder habits and decrease urinary incontinence.    Baseline:  Goal status: IN PROGRESS       LONG TERM GOALS: Target date: 04/16/22 - updated 04/13/22   Pt will be independent with advanced HEP.    Baseline:  Goal status: IN PROGRESS   2.  Pt will demonstrate normal pelvic floor muscle tone and A/ROM, able to achieve 4/5 strength with contractions and 10 sec endurance, in order to provide appropriate lumbopelvic support in functional activities.    Baseline: strength 2/5, endurance 7 seconds Goal status: IN PROGRESS   3.  Pt will report no leaks with laughing, coughing, sneezing in order to improve comfort with interpersonal relationships and community activities.     Baseline: been worse due to being sick Goal status:IN PROGRESS   4.  Pt will be able to go 2-3 hours in between voids without urgency or incontinence in order to improve QOL and perform all functional activities with less difficulty.    Baseline: still having urgency and leaking on way to bathroom Goal status: IN PROGRESS   5.  Pt will decrease frequency of nightly trips to the bathroom to 1 or less in order to get restful sleep.    Baseline:  Goal status: IN PROGRESS   6.  Pt will report 0/10 pain with vaginal penetration in order to improve intimate relationship with partner.     Baseline:  Goal status: IN PROGRESS   PLAN: PT FREQUENCY: 1x/week   PT DURATION: 10 weeks   PLANNED INTERVENTIONS: Therapeutic exercises, Therapeutic activity, Neuromuscular re-education, Balance training, Gait training, Patient/Family education, Joint mobilization,  Dry Needling, Biofeedback, and Manual therapy   PLAN FOR NEXT SESSION: Progress functional core/pelvic floor exercises.    Heather Roberts, PT, DPT07/17/2311:49 AM

## 2022-05-04 DIAGNOSIS — Z1231 Encounter for screening mammogram for malignant neoplasm of breast: Secondary | ICD-10-CM | POA: Diagnosis not present

## 2022-05-05 ENCOUNTER — Encounter (HOSPITAL_BASED_OUTPATIENT_CLINIC_OR_DEPARTMENT_OTHER): Payer: Self-pay | Admitting: *Deleted

## 2022-05-05 ENCOUNTER — Encounter: Payer: Self-pay | Admitting: Cardiology

## 2022-05-11 ENCOUNTER — Ambulatory Visit: Payer: Medicare Other

## 2022-05-11 DIAGNOSIS — M6281 Muscle weakness (generalized): Secondary | ICD-10-CM

## 2022-05-11 DIAGNOSIS — R279 Unspecified lack of coordination: Secondary | ICD-10-CM

## 2022-05-11 NOTE — Therapy (Signed)
OUTPATIENT PHYSICAL THERAPY TREATMENT NOTE   Patient Name: Patricia [name] MRN: [id number] DOB:1951-01-14, 71 y.o., female Today's Date: 05/11/2022  PCP: [name], MD REFERRING PROVIDER: [name], MD  END OF SESSION:   PT End of Session - 05/11/22 1101     Visit Number 6    Date for PT Re-Evaluation 06/08/22    Authorization Type BCBS Medicare    Progress Note Due on Visit 10    PT Start Time 1100    PT Stop Time 1140    PT Time Calculation (min) 40 min    Activity Tolerance Patient tolerated treatment well    Behavior During Therapy WFL for tasks assessed/performed                 Past Medical History:  Diagnosis Date   Aneurysm of aorta ([place])    Arthritis    Atrophic vaginitis    Cellulitis and abscess of right leg 05/07/2018   Depression    Diverticulitis    DYSPNEA 06/04/2009   Qualifier: Diagnosis of  By: Elsworth Soho MD, Leanna Sato.     Edema of both legs    She takes Lasix a couple of times per week to decrease swelling.     Fatty liver    Hyperlipidemia    Hypertension    Keratosis    hands and lower limbs   Lymphedema    legs   Memory difficulty    Obesity    Reflux    Shingles    Venous stasis    lower legs   Past Surgical History:  Procedure Laterality Date   ABDOMINAL HYSTERECTOMY     AUGMENTATION MAMMAPLASTY     Basal and Squamous cell cancers excised     CESAREAN SECTION     X 2   CYSTOSCOPY/RETROGRADE/URETEROSCOPY/STONE EXTRACTION WITH BASKET     removal couldnt blast   EYE SURGERY     bilateral cataract with lens implants   lipoma surgery     LIPOSUCTION     knees   TOTAL KNEE ARTHROPLASTY Left 09/26/2018   Procedure: TOTAL KNEE ARTHROPLASTY;  Surgeon: Paralee Cancel, MD;  Location: [facility];  Service: Orthopedics;  Laterality: Left;  70 mins   tummy tuck     Patient Active Problem List   Diagnosis Date Noted   Chronic venous insufficiency 07/14/2021   Chronic cough 06/02/2021   Allergic rhinitis 05/15/2019   Amnesia  05/15/2019   Bunion 05/15/2019   Colon, diverticulosis 05/15/2019   NASH (nonalcoholic steatohepatitis) 05/15/2019   Varicose veins of other specified sites 05/15/2019   Pain in thumb joint with movement of left hand 03/29/2019   Pain in thumb joint with movement, right 03/29/2019   S/P left TKA 09/26/2018   Osteoarthritis of left knee 09/06/2018   Fatigue 08/07/2018   Need for influenza vaccination 08/07/2018   Venous stasis syndrome 07/18/2018   Lymphedema of right lower extremity 05/29/2018   Cellulitis 05/07/2018   Cellulitis and abscess of right leg 05/07/2018   Pain in left knee 12/09/2017   Lipoma 10/29/2014   Encounter for consultation 11/06/2013   Essential hypertension, benign 02/26/2013   Obesity, unspecified 02/26/2013   Aneurysm of aorta ([place])    Depression    Fatty liver    Reflux    Atrophic vaginitis    DYSPNEA 06/04/2009   HYPERLIPIDEMIA 06/03/2009   HYPERTENSION 06/03/2009   MITRAL VALVE PROLAPSE 06/03/2009   Esophageal reflux 06/03/2009    REFERRING DIAG: R32 (ICD-10-CM) - Unspecified  urinary incontinence  THERAPY DIAG:  Muscle weakness (generalized)  Unspecified lack of coordination  Rationale for Evaluation and Treatment Rehabilitation  PERTINENT HISTORY: Diverticulosis  PRECAUTIONS: NA  SUBJECTIVE: Pt states that she had a very busy weekend cleaning out closets and was not able to perform as many exercises. She denies any urinary incontinence in the last week.   PAIN:  Are you having pain? No   SUBJECTIVE:                                                                                                                                                                                            SUBJECTIVE STATEMENT 02/05/22: Pt states that she started having issues with urinary incontinence about 2 years ago with just a little leaking, and now it has gotten worse. She leaking with any cough/sneeze and when she wakes up in the middle of the night  to go to the bathroom she will leak on the way to hte bathroom.  Fluid intake: Yes: 24oz of water, one cup of coffee each morning, once and a while alcohol.     Patient confirms identification and approves PT to assess pelvic floor and treatment Yes     PAIN:  Are you having pain? Yes- occasionally lower abdomen that feels like a lot of pressure     PRECAUTIONS: None   WEIGHT BEARING RESTRICTIONS No   FALLS:  Has patient fallen in last 6 months? 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   PLOF: Independent   PATIENT GOALS decrease leaking   PERTINENT HISTORY:  Diverticulosis Sexual abuse: No   BOWEL MOVEMENT Pain with bowel movement: No Type of bowel movement:Frequency 2x/day and Strain No Fully empty rectum: Yes: - Leakage: No Pads: No Fiber supplement: No   URINATION Pain with urination: No Fully empty bladder: No - she has to move around to completely empty bladder and sometimes post-void dribbling Stream:  WNL except for dribbles at the end Urgency: Yes: depends on when she last went how long she has to make it Frequency: 2-3 hours during the day and 2x/night Leakage: Urge to void, Coughing, Sneezing, and Laughing Pads: Yes: one/day   INTERCOURSE Pain with intercourse: Initial Penetration Ability to have vaginal penetration:  Yes: but states there is a lot of dryness Climax: yes     PREGNANCY Vaginal deliveries 2 Tearing No C-section deliveries 2     OBJECTIVE 04/13/22: Pelvic floor strength 2/5, endurance 7 seconds at 2/5 strength, 7 repetitions with moderate level  of coordination; increased tissue sensitivity in superficial pelvic floor most consistent with mucosal thinning due to lack of tension; Rt sided levator ani discomfort again with lack of tissue tension and more consistent with mucosal changes   02/04/22:   COGNITION:             Overall cognitive status: Within functional limits for tasks assessed                          SENSATION:            Light touch: Appears intact            Proprioception: Appears intact   PELVIC MMT: Strength 2/5 with poor coordination/isolation, 7 repetitions with increasing difficulty with isolation and coordination, endurance 3 seconds; mild tenderness throughout superficial muscles.          PALPATION:   General  mild tenderness in lower abdomen and pinpoint tenderness over pubic symphysis; no diastasis present but coning in upper abdominals with strength testing and weakness                 External Perineal Exam evidence of changes consistent with GSM                                        Internal Pelvic Floor very mild tenderness in superficial pelvic floor with dryness noted   TONE: WNL   PROLAPSE: None palpable - difficulty with coordination of bearing down   TODAY'S TREATMENT 05/11/22 Neuromuscular re-education: Core facilitation: Seated march with anterior weight hold 2 x 10 PNF D2 flexion 10x bil, yellow band  Exercises: Strengthening: Bridge with hip adduction 2 x 10, green band horizontal abduction for 10x Bridge with march 2 x 10   TREATMENT 05/03/22 Neuromuscular re-education: Core facilitation: Leg extension 10x bil Resisted march with blue band - cues to reduce posterior lean and breath coordination, 2 x 10 blue band Horizontal abduction 2 x 10, green band Exercises: Stretches/mobility: Piriformis stretch 60 sec bil supine/seated  Strengthening: Seated clam shell, blue band 2 x 10 Bridge with ball squeeze 2 x 10 Squats 2 x 10   TREATMENT 04/22/22 Neuromuscular re-education: Core retraining:  Transversus abdominus training with multimodal cues for improved motor control and breath coordination Core facilitation: Supine march 2 x 10  Leg extensions 10x bil Exercises: Strengthening: Seated clam shell, blue band 2 x 10 Bridge with ball  squeeze Self-care: Reviewed bladder retraining Reviewed pelvic floor anatomy and that she should see MD for continuation of vaginal itching/discharge symptoms        PATIENT EDUCATION:  Education details: Exercise progressions Person educated: Patient Education method: Explanation, Demonstration, Tactile cues, Verbal cues, and Handouts Education comprehension: verbalized understanding     HOME EXERCISE PROGRAM: [id number]   ASSESSMENT:   CLINICAL IMPRESSION: Pt doing very well with no symptoms of urinary incontinence in the last week. She is feeling confident in improvements. Exercises progressed to include more difficult exercises to challenge core/pelvic floor activation with only mild-moderate cues to improve activation with breath coordination. She is on track to D/C next treatment session. She will continue to benefit from skilled PT intervention in order to address impairments, decrease urinary incontinence, and improve QOL.      OBJECTIVE IMPAIRMENTS decreased activity tolerance, decreased coordination, decreased endurance, decreased mobility, decreased ROM, decreased strength, hypomobility, increased fascial restrictions, increased  muscle spasms, and pain.    ACTIVITY LIMITATIONS  situations with increased abdominal pressure and intercourse .    PERSONAL FACTORS 1 comorbidity: 2 cesareans  are also affecting patient's functional outcome.      REHAB POTENTIAL: Good   CLINICAL DECISION MAKING: Stable/uncomplicated   EVALUATION COMPLEXITY: Low     GOALS: Goals reviewed with patient? Yes   SHORT TERM GOALS: Target date: 03/05/2022 - updated 04/13/22   Pt will be independent with HEP.    Baseline: Goal status: MET 04/13/22   2.  Pt will be independent with the knack, urge suppression technique, and double voiding in order to improve bladder habits and decrease urinary incontinence.    Baseline:  Goal status: IN PROGRESS       LONG TERM GOALS: Target date: 04/16/22  - updated 04/13/22   Pt will be independent with advanced HEP.    Baseline:  Goal status: IN PROGRESS   2.  Pt will demonstrate normal pelvic floor muscle tone and A/ROM, able to achieve 4/5 strength with contractions and 10 sec endurance, in order to provide appropriate lumbopelvic support in functional activities.    Baseline: strength 2/5, endurance 7 seconds Goal status: IN PROGRESS   3.  Pt will report no leaks with laughing, coughing, sneezing in order to improve comfort with interpersonal relationships and community activities.    Baseline: been worse due to being sick Goal status:IN PROGRESS   4.  Pt will be able to go 2-3 hours in between voids without urgency or incontinence in order to improve QOL and perform all functional activities with less difficulty.    Baseline: still having urgency and leaking on way to bathroom Goal status: IN PROGRESS   5.  Pt will decrease frequency of nightly trips to the bathroom to 1 or less in order to get restful sleep.    Baseline:  Goal status: IN PROGRESS   6.  Pt will report 0/10 pain with vaginal penetration in order to improve intimate relationship with partner.     Baseline:  Goal status: IN PROGRESS   PLAN: PT FREQUENCY: 1x/week   PT DURATION: 10 weeks   PLANNED INTERVENTIONS: Therapeutic exercises, Therapeutic activity, Neuromuscular re-education, Balance training, Gait training, Patient/Family education, Joint mobilization, Dry Needling, Biofeedback, and Manual therapy   PLAN FOR NEXT SESSION: Progress functional core/pelvic floor exercises.    Heather Roberts, PT, DPT07/25/2311:44 AM

## 2022-05-17 ENCOUNTER — Ambulatory Visit: Payer: Medicare Other

## 2022-05-17 DIAGNOSIS — M6281 Muscle weakness (generalized): Secondary | ICD-10-CM | POA: Diagnosis not present

## 2022-05-17 DIAGNOSIS — R279 Unspecified lack of coordination: Secondary | ICD-10-CM

## 2022-05-17 NOTE — Therapy (Signed)
OUTPATIENT PHYSICAL THERAPY TREATMENT NOTE   Patient Name: Patricia [name] MRN: [id number] DOB:[birth year], 71 y.o., female Today's Date: 05/17/2022  PCP: [name], MD REFERRING PROVIDER: [name], MD  END OF SESSION:   PT End of Session - 05/17/22 1113     Visit Number 7    Date for PT Re-Evaluation 06/08/22    Authorization Type [employer] Medicare    PT Start Time 1112    PT Stop Time 1140    PT Time Calculation (min) 28 min    Activity Tolerance Patient tolerated treatment well    Behavior During Therapy WFL for tasks assessed/performed                 Past Medical History:  Diagnosis Date   Aneurysm of aorta ([place])    Arthritis    Atrophic vaginitis    Cellulitis and abscess of right leg 05/07/2018   Depression    Diverticulitis    DYSPNEA 06/04/2009   Qualifier: Diagnosis of  By: Elsworth Soho MD, Leanna Sato.     Edema of both legs    She takes Lasix a couple of times per week to decrease swelling.     Fatty liver    Hyperlipidemia    Hypertension    Keratosis    hands and lower limbs   Lymphedema    legs   Memory difficulty    Obesity    Reflux    Shingles    Venous stasis    lower legs   Past Surgical History:  Procedure Laterality Date   ABDOMINAL HYSTERECTOMY     AUGMENTATION MAMMAPLASTY     Basal and Squamous cell cancers excised     CESAREAN SECTION     X 2   CYSTOSCOPY/RETROGRADE/URETEROSCOPY/STONE EXTRACTION WITH BASKET     removal couldnt blast   EYE SURGERY     bilateral cataract with lens implants   lipoma surgery     LIPOSUCTION     knees   TOTAL KNEE ARTHROPLASTY Left 09/26/2018   Procedure: TOTAL KNEE ARTHROPLASTY;  Surgeon: Paralee Cancel, MD;  Location: [facility];  Service: Orthopedics;  Laterality: Left;  70 mins   tummy tuck     Patient Active Problem List   Diagnosis Date Noted   Chronic venous insufficiency 07/14/2021   Chronic cough 06/02/2021   Allergic rhinitis 05/15/2019   Amnesia 05/15/2019   Bunion 05/15/2019    Colon, diverticulosis 05/15/2019   NASH (nonalcoholic steatohepatitis) 05/15/2019   Varicose veins of other specified sites 05/15/2019   Pain in thumb joint with movement of left hand 03/29/2019   Pain in thumb joint with movement, right 03/29/2019   S/P left TKA 09/26/2018   Osteoarthritis of left knee 09/06/2018   Fatigue 08/07/2018   Need for influenza vaccination 08/07/2018   Venous stasis syndrome 07/18/2018   Lymphedema of right lower extremity 05/29/2018   Cellulitis 05/07/2018   Cellulitis and abscess of right leg 05/07/2018   Pain in left knee 12/09/2017   Lipoma 10/29/2014   Encounter for consultation 11/06/2013   Essential hypertension, benign 02/26/2013   Obesity, unspecified 02/26/2013   Aneurysm of aorta ([place])    Depression    Fatty liver    Reflux    Atrophic vaginitis    DYSPNEA 06/04/2009   HYPERLIPIDEMIA 06/03/2009   HYPERTENSION 06/03/2009   MITRAL VALVE PROLAPSE 06/03/2009   Esophageal reflux 06/03/2009    REFERRING DIAG: R32 (ICD-10-CM) - Unspecified urinary incontinence  THERAPY DIAG:  Muscle weakness (generalized)  Unspecified lack of coordination  Rationale for Evaluation and Treatment Rehabilitation  PERTINENT HISTORY: Diverticulosis  PRECAUTIONS: NA  SUBJECTIVE: Pt states that she has not had time to do many of the newer exercises, but was able to do some of the lying down ones.   PAIN:  Are you having pain? No   SUBJECTIVE:                                                                                                                                                                                            SUBJECTIVE STATEMENT 02/05/22: Pt states that she started having issues with urinary incontinence about 2 years ago with just a little leaking, and now it has gotten worse. She leaking with any cough/sneeze and when she wakes up in the middle of the night to go to the bathroom she will leak on the way to hte bathroom.  Fluid intake:  Yes: 24oz of water, one cup of coffee each morning, once and a while alcohol.     Patient confirms identification and approves PT to assess pelvic floor and treatment Yes     PAIN:  Are you having pain? Yes- occasionally lower abdomen that feels like a lot of pressure     PRECAUTIONS: None   WEIGHT BEARING RESTRICTIONS No   FALLS:  Has patient fallen in last 6 months? 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   PLOF: Independent   PATIENT GOALS decrease leaking   PERTINENT HISTORY:  Diverticulosis Sexual abuse: No   BOWEL MOVEMENT Pain with bowel movement: No Type of bowel movement:Frequency 2x/day and Strain No Fully empty rectum: Yes: - Leakage: No Pads: No Fiber supplement: No   URINATION Pain with urination: No Fully empty bladder: No - she has to move around to completely empty bladder and sometimes post-void dribbling Stream:  WNL except for dribbles at the end Urgency: Yes: depends on when she last went how long she has to make it Frequency: 2-3 hours during the day and 2x/night Leakage: Urge to void, Coughing, Sneezing, and Laughing Pads: Yes: one/day   INTERCOURSE Pain with intercourse: Initial Penetration Ability to have vaginal penetration:  Yes: but states there is a lot of dryness Climax: yes     PREGNANCY Vaginal deliveries 2 Tearing No C-section deliveries 2     OBJECTIVE 04/13/22: Pelvic floor strength 2/5, endurance 7 seconds at 2/5 strength, 7 repetitions with moderate level of coordination; increased tissue sensitivity in superficial pelvic floor most consistent with mucosal thinning  due to lack of tension; Rt sided levator ani discomfort again with lack of tissue tension and more consistent with mucosal changes   02/04/22:   COGNITION:            Overall cognitive status: Within functional limits for tasks assessed                           SENSATION:            Light touch: Appears intact            Proprioception: Appears intact   PELVIC MMT: Strength 2/5 with poor coordination/isolation, 7 repetitions with increasing difficulty with isolation and coordination, endurance 3 seconds; mild tenderness throughout superficial muscles.          PALPATION:   General  mild tenderness in lower abdomen and pinpoint tenderness over pubic symphysis; no diastasis present but coning in upper abdominals with strength testing and weakness                 External Perineal Exam evidence of changes consistent with GSM                                        Internal Pelvic Floor very mild tenderness in superficial pelvic floor with dryness noted   TONE: WNL   PROLAPSE: None palpable - difficulty with coordination of bearing down   TODAY'S TREATMENT 05/17/22 Self-care: HEP review and other exercises to perform moving forward   TREATMENT 05/11/22 Neuromuscular re-education: Core facilitation: Seated march with anterior weight hold 2 x 10 PNF D2 flexion 10x bil, yellow band  Exercises: Strengthening: Bridge with hip adduction 2 x 10, green band horizontal abduction for 10x Bridge with march 2 x 10   TREATMENT 05/03/22 Neuromuscular re-education: Core facilitation: Leg extension 10x bil Resisted march with blue band - cues to reduce posterior lean and breath coordination, 2 x 10 blue band Horizontal abduction 2 x 10, green band Exercises: Stretches/mobility: Piriformis stretch 60 sec bil supine/seated  Strengthening: Seated clam shell, blue band 2 x 10 Bridge with ball squeeze 2 x 10 Squats 2 x 10       PATIENT EDUCATION:  Education details: Exercise progressions Person educated: Patient Education method: Explanation, Demonstration, Corporate treasurer cues, Verbal cues, and Handouts Education comprehension: verbalized understanding     HOME EXERCISE PROGRAM: [id number]   ASSESSMENT:   CLINICAL IMPRESSION: Pt  has done very well in PT and has met all personal and PT goals. She states that she is confident in HEP and feels like she has good variety in exercises to perform in different situations. We discussed HEP and other exercises/staying active and incorporating pelvic floor/core into whatever she is doing. Due to progress and having met all goals, she is prepared to D/C skilled PT intervention at this time.      OBJECTIVE IMPAIRMENTS decreased activity tolerance, decreased coordination, decreased endurance, decreased mobility, decreased ROM, decreased strength, hypomobility, increased fascial restrictions, increased muscle spasms, and pain.    ACTIVITY LIMITATIONS  situations with increased abdominal pressure and intercourse .    PERSONAL FACTORS 1 comorbidity: 2 cesareans  are also affecting patient's functional outcome.      REHAB POTENTIAL: Good   CLINICAL DECISION MAKING: Stable/uncomplicated   EVALUATION COMPLEXITY: Low     GOALS: Goals reviewed with patient? Yes   SHORT TERM GOALS:  Target date: 03/05/2022 - updated 05/17/22   Pt will be independent with HEP.    Baseline: Goal status: MET 04/13/22   2.  Pt will be independent with the knack, urge suppression technique, and double voiding in order to improve bladder habits and decrease urinary incontinence.    Baseline:  Goal status: MET 05/17/22       LONG TERM GOALS: Target date: 04/16/22 - updated 05/17/22   Pt will be independent with advanced HEP.    Baseline:  Goal status: MET 05/17/22   2.  Pt will demonstrate normal pelvic floor muscle tone and A/ROM, able to achieve 4/5 strength with contractions and 10 sec endurance, in order to provide appropriate lumbopelvic support in functional activities.    Baseline: Pt declines internal vaginal exam; can assume that she has met or come very near meeting these goals due to having no symptoms Goal status: MET 05/17/22   3.  Pt will report no leaks with laughing, coughing,  sneezing in order to improve comfort with interpersonal relationships and community activities.    Baseline: none Goal status: MET 05/17/22   4.  Pt will be able to go 2-3 hours in between voids without urgency or incontinence in order to improve QOL and perform all functional activities with less difficulty.    Baseline: No issues Goal status: MET 05/17/22   5.  Pt will decrease frequency of nightly trips to the bathroom to 1 or less in order to get restful sleep.    Baseline: Occasionally 1x some nights - still sipping water before/during night, but minimally Goal status: MET 05/17/22   6.  Pt will report 0/10 pain with vaginal penetration in order to improve intimate relationship with partner.     Baseline: has not had penetration due to other health reasons for husband, but has been intimate and reports good improvement in comfort and no pain Goal status: MET 05/17/22   PLAN: PT FREQUENCY: NA   PT DURATION: NA   PLANNED INTERVENTIONS: D/C   PLAN FOR NEXT SESSION: D/C  PHYSICAL THERAPY DISCHARGE SUMMARY  Visits from Start of Care: 7  Current functional level related to goals / functional outcomes: Independent    Remaining deficits: See above   Education / Equipment: HEP   Patient agrees to discharge. Patient goals were met. Patient is being discharged due to meeting the stated rehab goals.  Heather Roberts, PT, DPT07/31/2311:45 AM

## 2022-06-07 DIAGNOSIS — D1801 Hemangioma of skin and subcutaneous tissue: Secondary | ICD-10-CM | POA: Diagnosis not present

## 2022-06-07 DIAGNOSIS — Z85828 Personal history of other malignant neoplasm of skin: Secondary | ICD-10-CM | POA: Diagnosis not present

## 2022-06-07 DIAGNOSIS — L814 Other melanin hyperpigmentation: Secondary | ICD-10-CM | POA: Diagnosis not present

## 2022-06-07 DIAGNOSIS — L820 Inflamed seborrheic keratosis: Secondary | ICD-10-CM | POA: Diagnosis not present

## 2022-06-10 DIAGNOSIS — K7581 Nonalcoholic steatohepatitis (NASH): Secondary | ICD-10-CM | POA: Diagnosis not present

## 2022-06-10 DIAGNOSIS — I10 Essential (primary) hypertension: Secondary | ICD-10-CM | POA: Diagnosis not present

## 2022-06-10 DIAGNOSIS — E782 Mixed hyperlipidemia: Secondary | ICD-10-CM | POA: Diagnosis not present

## 2022-06-10 DIAGNOSIS — E559 Vitamin D deficiency, unspecified: Secondary | ICD-10-CM | POA: Diagnosis not present

## 2022-06-10 DIAGNOSIS — J42 Unspecified chronic bronchitis: Secondary | ICD-10-CM | POA: Diagnosis not present

## 2022-06-10 DIAGNOSIS — R5383 Other fatigue: Secondary | ICD-10-CM | POA: Diagnosis not present

## 2022-06-10 DIAGNOSIS — Z6832 Body mass index (BMI) 32.0-32.9, adult: Secondary | ICD-10-CM | POA: Diagnosis not present

## 2022-06-28 DIAGNOSIS — Z6832 Body mass index (BMI) 32.0-32.9, adult: Secondary | ICD-10-CM | POA: Diagnosis not present

## 2022-06-28 DIAGNOSIS — E669 Obesity, unspecified: Secondary | ICD-10-CM | POA: Diagnosis not present

## 2022-06-28 DIAGNOSIS — E559 Vitamin D deficiency, unspecified: Secondary | ICD-10-CM | POA: Diagnosis not present

## 2022-06-30 ENCOUNTER — Ambulatory Visit: Payer: Medicare Other | Admitting: Cardiology

## 2022-07-09 DIAGNOSIS — Z961 Presence of intraocular lens: Secondary | ICD-10-CM | POA: Diagnosis not present

## 2022-07-09 DIAGNOSIS — H04123 Dry eye syndrome of bilateral lacrimal glands: Secondary | ICD-10-CM | POA: Diagnosis not present

## 2022-07-22 DIAGNOSIS — E538 Deficiency of other specified B group vitamins: Secondary | ICD-10-CM | POA: Diagnosis not present

## 2022-07-22 DIAGNOSIS — E559 Vitamin D deficiency, unspecified: Secondary | ICD-10-CM | POA: Diagnosis not present

## 2022-07-22 DIAGNOSIS — I10 Essential (primary) hypertension: Secondary | ICD-10-CM | POA: Diagnosis not present

## 2022-07-22 DIAGNOSIS — I700 Atherosclerosis of aorta: Secondary | ICD-10-CM | POA: Diagnosis not present

## 2022-08-19 DIAGNOSIS — Z85828 Personal history of other malignant neoplasm of skin: Secondary | ICD-10-CM | POA: Diagnosis not present

## 2022-08-19 DIAGNOSIS — L820 Inflamed seborrheic keratosis: Secondary | ICD-10-CM | POA: Diagnosis not present

## 2022-08-19 DIAGNOSIS — D485 Neoplasm of uncertain behavior of skin: Secondary | ICD-10-CM | POA: Diagnosis not present

## 2022-08-19 DIAGNOSIS — L570 Actinic keratosis: Secondary | ICD-10-CM | POA: Diagnosis not present

## 2022-08-20 ENCOUNTER — Other Ambulatory Visit: Payer: Self-pay | Admitting: Family Medicine

## 2022-08-20 DIAGNOSIS — E2839 Other primary ovarian failure: Secondary | ICD-10-CM

## 2022-08-20 DIAGNOSIS — Z13820 Encounter for screening for osteoporosis: Secondary | ICD-10-CM

## 2022-08-23 ENCOUNTER — Ambulatory Visit
Admission: RE | Admit: 2022-08-23 | Discharge: 2022-08-23 | Disposition: A | Discharge status: Home / Self Care | Payer: Medicare Other | Source: Ambulatory Visit | Attending: Advanced Practice Midwife | Admitting: Advanced Practice Midwife

## 2022-08-23 DIAGNOSIS — Z0389 Encounter for observation for other suspected diseases and conditions ruled out: Secondary | ICD-10-CM | POA: Diagnosis not present

## 2022-08-23 DIAGNOSIS — Z13820 Encounter for screening for osteoporosis: Secondary | ICD-10-CM

## 2022-08-23 DIAGNOSIS — E2839 Other primary ovarian failure: Secondary | ICD-10-CM

## 2022-08-24 ENCOUNTER — Ambulatory Visit: Payer: Medicare Other | Attending: Cardiology | Admitting: Cardiology

## 2022-08-24 ENCOUNTER — Encounter: Payer: Self-pay | Admitting: Cardiology

## 2022-08-24 VITALS — BP 100/70 | HR 87 | Ht 64.0 in | Wt 194.0 lb

## 2022-08-24 DIAGNOSIS — I10 Essential (primary) hypertension: Secondary | ICD-10-CM

## 2022-08-24 DIAGNOSIS — I700 Atherosclerosis of aorta: Secondary | ICD-10-CM | POA: Diagnosis not present

## 2022-08-24 DIAGNOSIS — I872 Venous insufficiency (chronic) (peripheral): Secondary | ICD-10-CM

## 2022-08-24 DIAGNOSIS — I77810 Thoracic aortic ectasia: Secondary | ICD-10-CM

## 2022-08-24 DIAGNOSIS — E538 Deficiency of other specified B group vitamins: Secondary | ICD-10-CM | POA: Diagnosis not present

## 2022-08-24 DIAGNOSIS — E782 Mixed hyperlipidemia: Secondary | ICD-10-CM

## 2022-08-24 DIAGNOSIS — I2510 Atherosclerotic heart disease of native coronary artery without angina pectoris: Secondary | ICD-10-CM

## 2022-08-24 DIAGNOSIS — I890 Lymphedema, not elsewhere classified: Secondary | ICD-10-CM

## 2022-08-24 DIAGNOSIS — E559 Vitamin D deficiency, unspecified: Secondary | ICD-10-CM | POA: Diagnosis not present

## 2022-08-24 MED ORDER — ASPIRIN 81 MG PO TBEC: 81.0000 mg | DELAYED_RELEASE_TABLET | Freq: Every day | ORAL | 3 refills | Status: DC

## 2022-08-24 NOTE — Patient Instructions (Addendum)
Medication Instructions:  Please start Aspirin 81 mg a day. Continue all other medications as listed.  *If you need a refill on your cardiac medications before your next appointment, please call your pharmacy*  Your physician has requested that you have an echocardiogram in 1 year. Echocardiography is a painless test that uses sound waves to create images of your heart. It provides your doctor with information about the size and shape of your heart and how well your heart's chambers and valves are working. This procedure takes approximately one hour. There are no restrictions for this procedure. Please do NOT wear cologne, perfume, aftershave, or lotions (deodorant is allowed). Please arrive 15 minutes prior to your appointment time.   Follow-Up: At [facility], you and your health needs are our priority.  As part of our continuing mission to provide you with exceptional heart care, we have created designated Provider Care Teams.  These Care Teams include your primary Cardiologist (physician) and Advanced Practice Providers (APPs -  Physician Assistants and Nurse Practitioners) who all work together to provide you with the care you need, when you need it.  We recommend signing up for the patient portal called "MyChart".  Sign up information is provided on this After Visit Summary.  MyChart is used to connect with patients for Virtual Visits (Telemedicine).  Patients are able to view lab/test results, encounter notes, upcoming appointments, etc.  Non-urgent messages can be sent to your provider as well.   To learn more about what you can do with MyChart, go to [email or web address].    Your next appointment:   1 year(s)  The format for your next appointment:   In Person  Provider:   [name], MD      Important Information About Sugar

## 2022-08-24 NOTE — Progress Notes (Signed)
Cardiology Office Note:    Date:  08/24/2022   ID:  Patricia [name], DOB Jan 02, 1951, MRN [id number]  PCP:  [name], MD   [facility] Providers Cardiologist:  [name], MD     Referring MD: [name], MD    History of Present Illness:    Patricia [name] is a 71 y.o. 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  Coronary cardiac CT showed moderate plaque with no flow limitations calcium score was 348.  Aorta was 43 mm.  Added Crestor.  At prior visit in November 2021 with Cecilie Kicks, NP she was doing well without any major issues.  Had a few palpitations on rare occasions.  Fullness in the chest like GERD.  Was tolerating Crestor without any problems.  Today, she presents overall feeling well. She reports still having episodes of feeling pressure and pain in her chest that she feels radiates to her jaw.  She reports having both lymphedema and lipidemia. She has been using her compression socks.   She reports taking a medication for a clinical trial she is participating in for 6 months. She is unaware of the name of the pill but reports they are supposed to help with persistent coughs.   She denies any palpitations, chest pain, shortness of breath, or peripheral edema. No lightheadedness, headaches, syncope, orthopnea, or PND.   Past Medical History:  Diagnosis Date   Aneurysm of aorta ([place])    Arthritis    Atrophic vaginitis    Cellulitis and abscess of right leg 05/07/2018   Depression    Diverticulitis    DYSPNEA 06/04/2009   Qualifier: Diagnosis of  By: Elsworth Soho MD, Leanna Sato.     Edema of both legs    She takes Lasix a couple of times per week to decrease swelling.     Fatty liver    Hyperlipidemia    Hypertension    Keratosis    hands and lower limbs   Lymphedema    legs   Memory difficulty    Obesity    Reflux    Shingles     Venous stasis    lower legs    Past Surgical History:  Procedure Laterality Date   ABDOMINAL HYSTERECTOMY     AUGMENTATION MAMMAPLASTY     Basal and Squamous cell cancers excised     CESAREAN SECTION     X 2   CYSTOSCOPY/RETROGRADE/URETEROSCOPY/STONE EXTRACTION WITH BASKET     removal couldnt blast   EYE SURGERY     bilateral cataract with lens implants   lipoma surgery     LIPOSUCTION     knees   TOTAL KNEE ARTHROPLASTY Left 09/26/2018   Procedure: TOTAL KNEE ARTHROPLASTY;  Surgeon: Paralee Cancel, MD;  Location: [facility];  Service: Orthopedics;  Laterality: Left;  70 mins   tummy tuck      Current Medications: Current Meds  Medication Sig   ALPRAZolam (XANAX) 1 MG tablet Take 1 mg by mouth as needed.   amLODipine (NORVASC) 5 MG tablet Take 5 mg by mouth daily.   amphetamine-dextroamphetamine (ADDERALL) 10 MG tablet Take 10 mg by mouth daily with breakfast.   aspirin EC 81 MG tablet Take 1 tablet (81 mg total) by mouth daily. Swallow whole.   desvenlafaxine (PRISTIQ) 50 MG 24 hr tablet Take 50 mg by mouth daily.   fluticasone (FLONASE)  50 MCG/ACT nasal spray instill 2 sprays into each nostril once daily   loratadine (CLARITIN) 10 MG tablet Take 1 tablet (10 mg total) by mouth daily.   metoprolol succinate (TOPROL-XL) 50 MG 24 hr tablet TAKE ONE TABLET BY MOUTH ONE TIME DAILY  WITH OR IMMEDIATELY FOLLOWING A MEAL   pantoprazole (PROTONIX) 40 MG tablet take 1 tablet by mouth 1 hour before eating twice a day until next office visit   polyethylene glycol (MIRALAX / GLYCOLAX) 17 g packet Take 17 g by mouth daily.   rosuvastatin (CRESTOR) 20 MG tablet TAKE ONE TABLET BY MOUTH ONE TIME DAILY   triamterene-hydrochlorothiazide (MAXZIDE-25) 37.5-25 MG per tablet Take 1 tablet by mouth daily.       Allergies:   Ciprofloxacin, Clindamycin/lincomycin, Morphine, and Tetracyclines & related   Social History   Socioeconomic History   Marital status: Married    Spouse name: [name]    Number of children: 2   Years of education: 12   Highest education level: Not on file  Occupational History   Occupation: Music therapist: [employer]    Comment: SELF-EMPLOYED   Tobacco Use   Smoking status: Former    Packs/day: 1.00    Years: 25.00    Total pack years: 25.00    Types: Cigarettes   Smokeless tobacco: Never  Vaping Use   Vaping Use: Never used  Substance and Sexual Activity   Alcohol use: Yes    Alcohol/week: 0.0 standard drinks of alcohol    Comment: Rare   Drug use: No   Sexual activity: Yes    Birth control/protection: Surgical    Comment: HYST-1st intercourse 71 yo-Fewer than 5 partners  Other Topics Concern   Not on file  Social History Narrative   Marital Status: Married Engineer, technical sales)   Children:  Sons (2)    Pets: Cat    Living Situation: Lives with husband    Occupation: Therapist, occupational - [employer]   Education: Programmer, systems    Tobacco Use/Exposure:  None    Alcohol Use:  Occasional   Drug Use:  None   Diet:  Regular   Exercise:  Bikram Yoga    Hobbies: Gardening/ Reading   Patient is right handed               Social Determinants of Radio broadcast assistant Strain: Not on file  Food Insecurity: Not on file  Transportation Needs: Not on file  Physical Activity: Not on file  Stress: Not on file  Social Connections: Not on file     Family History: The patient's family history includes Alzheimer's disease in her father; Bipolar disorder in her sister; Breast cancer in her cousin; Cancer in her paternal grandmother; Depression in her sister; Diabetes in her mother; Diverticulitis in her maternal grandmother, paternal grandfather, and paternal grandmother; Heart disease in her mother; Hyperlipidemia in her mother; Hypertension in her father and mother; Liver cancer in her paternal grandfather; Uterine cancer in her mother.  ROS:   Please see the history of present illness.    (+) Chest pain   (+) Chest pressure All other systems reviewed and are negative.  EKGs/Labs/Other Studies Reviewed:    The following studies were reviewed today:  LE Venous Reflux Left 07/14/2021: Summary:  Left:  - No evidence of deep vein thrombosis seen in the left lower extremity,  from the common femoral through the popliteal veins.  - No evidence of  superficial venous thrombosis in the left lower  extremity.  - No evidence of deep vein reflux.  - Superficial vein reflux in the SFJ and GSV as above.   Cardiac CT IMPRESSION: 1. Coronary calcium score of 348. This was 81 percentile for age and sex matched control. 2. Normal coronary origin with right dominance. 3.  There is LAD calcified plaque proximally with 0-24% stenosis. 4. There is Ramus mixed plaque proximally with 50-69% stenosis. Sending for FFR analysis. No flow limiting. 5.  There is RCA calcified plaque with 0-24% stenosis. 6. Dilated ascending aorta, 43 mm, stable from prior. Continue to monitor annually.  ECHO 05/21/21: Aortic root stable at 43 mm.  Previously described at 45 mm. Normal pump function.  EKG:  EKG is personally reviewed and interpreted.  08/24/2022: EKG was not ordered.  03/07/2022: Normal sinus rhythm. Rate 90 bpm.  05/25/2021: The ekg ordered today demonstrates SR 70  Recent Labs: 03/07/2022: BUN 10; Creatinine, Ser 0.76; Hemoglobin 14.5; Platelets 205; Potassium 3.8; Sodium 139  Recent Lipid Panel    Component Value Date/Time   CHOL 141 11/25/2020 0954   TRIG 138 11/25/2020 0954   HDL 49 11/25/2020 0954   CHOLHDL 2.9 11/25/2020 0954   LDLCALC 68 11/25/2020 0954     Risk Assessment/Calculations:          Physical Exam:    VS:  BP 100/70 (BP Location: Left Arm, Patient Position: Sitting, Cuff Size: Normal)   Pulse 87   Ht 5' 4"  (1.626 m)   Wt 194 lb (88 kg)   SpO2 94%   BMI 33.30 kg/m     Wt Readings from Last 3 Encounters:  08/24/22 194 lb (88 kg)  03/06/22 195 lb 12.3 oz (88.8 kg)   02/24/22 195 lb 12.8 oz (88.8 kg)     GEN:  Well nourished, well developed in no acute distress HEENT: Normal NECK: No JVD; No carotid bruits LYMPHATICS: No lymphadenopathy CARDIAC:  RRR, no murmurs, rubs, gallops RESPIRATORY:  Clear to auscultation without rales, wheezing or rhonchi  ABDOMEN: Soft, non-tender, non-distended MUSCULOSKELETAL:  edema chronic edema; No deformity  SKIN: Warm and dry NEUROLOGIC:  Alert and oriented x 3 PSYCHIATRIC:  Normal affect   ASSESSMENT:    1. Coronary artery disease involving native coronary artery of native heart without angina pectoris   2. Mixed hyperlipidemia   3. Essential hypertension   4. Lymphedema   5. Chronic venous insufficiency   6. Dilated aortic root (HCC)     PLAN:    In order of problems listed above:  Moderate coronary artery disease - Nonflow limiting on coronary CT as described above.  Continue with aggressive goal-directed medical therapy.  Calcium score in the 340 range.  We will add aspirin 81 mg to her prevention strategy which also includes beta-blocker and statin.  Doing well.  Good overall blood pressure control.  She still will occasionally have discomfort of chest, could be related to microvascular disease.  There was moderate nonflow limiting coronary disease on CT scan in 2021.  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  Continue with beta-blocker, hypertension control.  We will check echocardiogram next year to monitor size.  Has been stable.  Hyperlipidemia - On Crestor 20 mg high intensity dose.  LDL 68 previously, excellent  Palpitations - Decreased on beta-blocker.  Continue with Toprol.  Essential hypertension - Excellent control today.  Multidrug regimen including triamterene hydrochlorothiazide Toprol and Norvasc 5 mg.  LE Edema --  Dr. Claretha Cooper note reviewed from January 2023.  Continue with compression stockings, leg elevation, conservative management.  Has lymphedema as  well.   Follow up in 1 year.   Medication Adjustments/Labs and Tests Ordered: Current medicines are reviewed at length with the patient today.  Concerns regarding medicines are outlined above.  Orders Placed This Encounter  Procedures   ECHOCARDIOGRAM COMPLETE   Meds ordered this encounter  Medications   aspirin EC 81 MG tablet    Sig: Take 1 tablet (81 mg total) by mouth daily. Swallow whole.    Dispense:  90 tablet    Refill:  3   Patient Instructions  Medication Instructions:  Please start Aspirin 81 mg a day. Continue all other medications as listed.  *If you need a refill on your cardiac medications before your next appointment, please call your pharmacy*  Your physician has requested that you have an echocardiogram in 1 year. Echocardiography is a painless test that uses sound waves to create images of your heart. It provides your doctor with information about the size and shape of your heart and how well your heart's chambers and valves are working. This procedure takes approximately one hour. There are no restrictions for this procedure. Please do NOT wear cologne, perfume, aftershave, or lotions (deodorant is allowed). Please arrive 15 minutes prior to your appointment time.   Follow-Up: At [facility], you and your health needs are our priority.  As part of our continuing mission to provide you with exceptional heart care, we have created designated Provider Care Teams.  These Care Teams include your primary Cardiologist (physician) and Advanced Practice Providers (APPs -  Physician Assistants and Nurse Practitioners) who all work together to provide you with the care you need, when you need it.  We recommend signing up for the patient portal called "MyChart".  Sign up information is provided on this After Visit Summary.  MyChart is used to connect with patients for Virtual Visits (Telemedicine).  Patients are able to view lab/test results, encounter notes,  upcoming appointments, etc.  Non-urgent messages can be sent to your provider as well.   To learn more about what you can do with MyChart, go to [email or web address].    Your next appointment:   1 year(s)  The format for your next appointment:   In Person  Provider:   [name], MD      Important Information About Sugar           I,Rachel Rivera,acting as a scribe for [name], MD.,have documented all relevant documentation on the behalf of [name], MD,as directed by  [name], MD while in the presence of [name], MD.  I, [name], MD, have reviewed all documentation for this visit. The documentation on 08/24/22 for the exam, diagnosis, procedures, and orders are all accurate and complete.  Signed, [name], MD  08/24/2022 10:00 AM    [facility]

## 2022-09-01 ENCOUNTER — Other Ambulatory Visit: Payer: Self-pay | Admitting: Cardiology

## 2022-09-07 DIAGNOSIS — I10 Essential (primary) hypertension: Secondary | ICD-10-CM | POA: Diagnosis not present

## 2022-09-07 DIAGNOSIS — E538 Deficiency of other specified B group vitamins: Secondary | ICD-10-CM | POA: Diagnosis not present

## 2022-09-07 DIAGNOSIS — E559 Vitamin D deficiency, unspecified: Secondary | ICD-10-CM | POA: Diagnosis not present

## 2022-09-07 DIAGNOSIS — K7581 Nonalcoholic steatohepatitis (NASH): Secondary | ICD-10-CM | POA: Diagnosis not present

## 2022-09-15 DIAGNOSIS — H5213 Myopia, bilateral: Secondary | ICD-10-CM | POA: Diagnosis not present

## 2022-09-23 DIAGNOSIS — L814 Other melanin hyperpigmentation: Secondary | ICD-10-CM | POA: Diagnosis not present

## 2022-09-23 DIAGNOSIS — Z85828 Personal history of other malignant neoplasm of skin: Secondary | ICD-10-CM | POA: Diagnosis not present

## 2022-09-23 DIAGNOSIS — L570 Actinic keratosis: Secondary | ICD-10-CM | POA: Diagnosis not present

## 2022-09-29 DIAGNOSIS — F900 Attention-deficit hyperactivity disorder, predominantly inattentive type: Secondary | ICD-10-CM | POA: Diagnosis not present

## 2022-09-29 DIAGNOSIS — M25561 Pain in right knee: Secondary | ICD-10-CM | POA: Diagnosis not present

## 2022-09-29 DIAGNOSIS — I10 Essential (primary) hypertension: Secondary | ICD-10-CM | POA: Diagnosis not present

## 2022-09-29 DIAGNOSIS — K7581 Nonalcoholic steatohepatitis (NASH): Secondary | ICD-10-CM | POA: Diagnosis not present

## 2022-11-24 ENCOUNTER — Other Ambulatory Visit: Payer: Self-pay | Admitting: Cardiology

## 2022-12-22 ENCOUNTER — Encounter: Payer: Self-pay | Admitting: Podiatry

## 2022-12-22 ENCOUNTER — Ambulatory Visit: Payer: Medicare HMO | Admitting: Podiatry

## 2022-12-22 DIAGNOSIS — B351 Tinea unguium: Secondary | ICD-10-CM

## 2022-12-22 DIAGNOSIS — L600 Ingrowing nail: Secondary | ICD-10-CM | POA: Diagnosis not present

## 2022-12-22 DIAGNOSIS — G629 Polyneuropathy, unspecified: Secondary | ICD-10-CM | POA: Diagnosis not present

## 2022-12-22 NOTE — Progress Notes (Signed)
Subjective:   Patient ID: Patricia [name], female   DOB: 72 y.o.   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

## 2023-05-13 ENCOUNTER — Other Ambulatory Visit: Payer: Self-pay | Admitting: Family Medicine

## 2023-05-13 DIAGNOSIS — K760 Fatty (change of) liver, not elsewhere classified: Secondary | ICD-10-CM

## 2023-05-19 ENCOUNTER — Ambulatory Visit
Admission: RE | Admit: 2023-05-19 | Discharge: 2023-05-19 | Disposition: A | Discharge status: Home / Self Care | Payer: Medicare Other | Source: Ambulatory Visit | Attending: Family Medicine | Admitting: Family Medicine

## 2023-05-19 DIAGNOSIS — K760 Fatty (change of) liver, not elsewhere classified: Secondary | ICD-10-CM

## 2023-06-03 ENCOUNTER — Other Ambulatory Visit: Payer: Self-pay | Admitting: *Deleted

## 2023-06-03 DIAGNOSIS — I2510 Atherosclerotic heart disease of native coronary artery without angina pectoris: Secondary | ICD-10-CM

## 2023-06-03 DIAGNOSIS — I77810 Thoracic aortic ectasia: Secondary | ICD-10-CM

## 2023-07-14 ENCOUNTER — Ambulatory Visit: Payer: Medicare HMO

## 2023-07-14 ENCOUNTER — Ambulatory Visit: Payer: Medicare HMO | Admitting: Emergency Medicine

## 2023-07-14 ENCOUNTER — Encounter: Payer: Self-pay | Admitting: Emergency Medicine

## 2023-07-14 VITALS — BP 130/80 | HR 69 | Ht 64.0 in | Wt 174.0 lb

## 2023-07-14 DIAGNOSIS — R053 Chronic cough: Secondary | ICD-10-CM | POA: Diagnosis not present

## 2023-07-14 MED ORDER — PANTOPRAZOLE SODIUM 40 MG PO TBEC: DELAYED_RELEASE_TABLET | ORAL | 0 refills | Status: DC

## 2023-07-14 MED ORDER — BENZONATATE 100 MG PO CAPS: 100.0000 mg | ORAL_CAPSULE | Freq: Four times a day (QID) | ORAL | 3 refills | Status: DC | PRN

## 2023-07-14 MED ORDER — HYDROCODONE BIT-HOMATROP MBR 5-1.5 MG/5ML PO SOLN: 5.0000 mL | Freq: Four times a day (QID) | ORAL | 0 refills | Status: DC | PRN

## 2023-07-14 NOTE — Progress Notes (Signed)
Subjective:    Patient ID: Patricia [name], female    DOB: 1951/05/25, 72 y.o.   MRN: [id number]  HPI  ROV 07/21/21 --this follow-up visit for evaluation of chronic cough and upper airway irritation.  She is 35 with a former tobacco history, hypertension, hyperlipidemia, depression, lymphedema.  She had a globus sensation.  I increased Nexium to pantoprazole twice daily, started loratadine and fluticasone nasal spray.  She underwent pulmonary function testing today Her GERD sx are less, as is her cough. Her nasal congestion is also better.   Pulmonary function testing performed today, reviewed by me, shows normal airflows without a bronchodilator response, decreased residual volume consistent with possible restriction (gas dilution), normal diffusion capacity.  Chest x-ray 06/02/2021 reviewed by me, showed mild right basilar atelectasis. CT chest with some basilar interstitial change as below.   ROV 07/14/2023 --Patricia [name] is 10 with history of former tobacco use, hypertension, hyperlipidemia, depression, lymphedema.  I have seen her in the past for chronic upper airway irritation, globus sensation and chronic cough.  Her pulmonary function testing shows normal airflows and a normal diffusion capacity (07/2021).  Her imaging has been reassuring in the past.  Since I last saw her her cough had been improved for an extended time - she had been participating in a study for cough for 1.5 yrs and her cough was much improved. She stopped the medication about 2 months ago because her LFT were elevated.  She began to have recurrence beginning she has started to have recurrent dry cough, paroxysmal during the day. Better with taking drink of water. Can wake her at night. She is not feeling overt GERD. No significant nasal congestion.  Has been on loratadine, fluticasone nasal spray, PPI in the past not currently.    Review of Systems As per HPI  Past Medical History:  Diagnosis Date   Aneurysm of aorta  (HCC)    Arthritis    Atrophic vaginitis    Cellulitis and abscess of right leg 05/07/2018   Depression    Diverticulitis    DYSPNEA 06/04/2009   Qualifier: Diagnosis of  By: Vassie Loll MD, Comer Locket.     Edema of both legs    She takes Lasix a couple of times per week to decrease swelling.     Fatty liver    Hyperlipidemia    Hypertension    Keratosis    hands and lower limbs   Lymphedema    legs   Memory difficulty    Obesity    Reflux    Shingles    Venous stasis    lower legs     Family History  Problem Relation Age of Onset   Uterine cancer Mother    Heart disease Mother    Diabetes Mother    Hypertension Mother    Hyperlipidemia Mother    Alzheimer's disease Father    Hypertension Father    Cancer Paternal Grandmother        Gallbladder cancer   Diverticulitis Paternal Grandmother        gallbladder   Liver cancer Paternal Grandfather    Diverticulitis Paternal Grandfather    Diverticulitis Maternal Grandmother    Breast cancer Cousin        Paternal 1st cousin Age 47   Depression Sister    Bipolar disorder Sister      Social History   Socioeconomic History   Marital status: Married    Spouse name: [name]   Number of children: 2  Years of education: 76   Highest education level: Not on file  Occupational History   Occupation: Product manager: [employer]    Comment: SELF-EMPLOYED   Tobacco Use   Smoking status: Former    Current packs/day: 1.00    Average packs/day: 1 pack/day for 25.0 years (25.0 ttl pk-yrs)    Types: Cigarettes   Smokeless tobacco: Never  Vaping Use   Vaping status: Never Used  Substance and Sexual Activity   Alcohol use: Yes    Alcohol/week: 0.0 standard drinks of alcohol    Comment: Rare   Drug use: No   Sexual activity: Yes    Birth control/protection: Surgical    Comment: HYST-1st intercourse 72 yo-Fewer than 5 partners  Other Topics Concern   Not on file  Social History Narrative   Marital Status:  Married Pharmacist, hospital)   Children:  Sons (2)    Pets: Cat    Living Situation: Lives with husband    Occupation: Development worker, international aid - [employer]   Education: Engineer, agricultural    Tobacco Use/Exposure:  None    Alcohol Use:  Occasional   Drug Use:  None   Diet:  Regular   Exercise:  Bikram Yoga    Hobbies: Gardening/ Reading   Patient is right handed               Social Determinants of Corporate investment banker Strain: Not on file  Food Insecurity: Not on file  Transportation Needs: Not on file  Physical Activity: Not on file  Stress: Not on file  Social Connections: Not on file  Intimate Partner Violence: Not on file    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 Had a bird many years ago.  No mold exposure to her knowledge   Allergies  Allergen Reactions   Ciprofloxacin Other (See Comments)    Pt has a descending heart aneurism and has been instructed not to take Cipro.    Clindamycin/Lincomycin Other (See Comments)    Stomach pain   Morphine     REACTION: itch   Tetracyclines & Related Other (See Comments)    Stomach pain     Outpatient Medications Prior to Visit  Medication Sig Dispense Refill   ALPRAZolam (XANAX) 1 MG tablet Take 1 mg by mouth as needed.     amLODipine (NORVASC) 5 MG tablet Take 5 mg by mouth daily.     amphetamine-dextroamphetamine (ADDERALL) 10 MG tablet Take 10 mg by mouth daily with breakfast.     aspirin EC 81 MG tablet Take 1 tablet (81 mg total) by mouth daily. Swallow whole. 90 tablet 3   desvenlafaxine (PRISTIQ) 50 MG 24 hr tablet Take 50 mg by mouth daily.     fluticasone (FLONASE) 50 MCG/ACT nasal spray instill 2 sprays into each nostril once daily 16 g 0   loratadine (CLARITIN) 10 MG tablet Take 1 tablet (10 mg total) by mouth daily. 30 tablet 5   metoprolol succinate (TOPROL-XL) 50 MG 24 hr tablet TAKE ONE TABLET BY MOUTH ONE TIME DAILY WITH OR IMMEDIATELY FOLLOWING A MEAL 90 tablet 3   pantoprazole (PROTONIX) 40 MG  tablet take 1 tablet by mouth 1 hour before eating twice a day until next office visit 60 tablet 0   polyethylene glycol (MIRALAX / GLYCOLAX) 17 g packet Take 17 g by mouth daily.     rosuvastatin (CRESTOR) 20 MG tablet TAKE ONE TABLET BY MOUTH ONE TIME  DAILY 90 tablet 3   triamterene-hydrochlorothiazide (MAXZIDE-25) 37.5-25 MG per tablet Take 1 tablet by mouth daily.       No facility-administered medications prior to visit.         Objective:   Physical Exam Vitals:   07/14/23 0848  BP: 130/80  Pulse: 69  SpO2: 97%  Weight: 174 lb (78.9 kg)  Height: 5\' 4"  (1.626 m)    Gen: Pleasant, well-nourished, in no distress,  normal affect  ENT: No lesions,  mouth clear,  oropharynx clear, no postnasal drip  Neck: No JVD, no stridor  Lungs: No use of accessory muscles, no crackles or wheezing on normal respiration, no wheeze on forced expiration  Cardiovascular: RRR, heart sounds normal, no murmur or gallops, no peripheral edema  Musculoskeletal: No deformities, no cyanosis or clubbing  Neuro: alert, awake, non focal  Skin: Warm, no lesions or rash      Assessment & Plan:  Chronic cough She has been participating in a clinical trial for chronic cough in [place].  I suspect she was in the treatment arm because her cough has been better for a year and a half.  The medication was stopped because she had some elevated LFTs, unclear whether there was a relationship.  I do not know what the study drug is.  Her cough is starting to return, can occur at night, dry.  I will start her back on empiric loratadine and pantoprazole.  Try cough suppression with Hycodan and Tessalon Perles.  Check a chest x-ray today to ensure no interval change.  If cough continues we will reinstitute workup, probably repeat her PFT, consider airway inspection.   Levy Pupa, MD, PhD 07/14/2023, 9:19 AM [facility] [phone] or if no answer before 7:00PM call [phone] For  any issues after 7:00PM please call eLink [phone]

## 2023-07-14 NOTE — Patient Instructions (Signed)
Chest x-ray today Please restart loratadine 10 mg once daily (generic Claritin) Please restart pantoprazole 40 mg once daily. Use Tessalon Perles 100 mg up to every 6 hours if needed for cough suppression Use Hycodan 5 cc up to every 6 hours if needed for cough suppression.  Please remember that this medication can cause drowsiness. Follow with APP in 2 to 3 months Follow Dr. Delton Coombes in 6 months, sooner if you have problems.

## 2023-07-14 NOTE — Addendum Note (Signed)
Addended by: Leslye Peer on: 07/14/2023 09:27 AM   Modules accepted: Orders

## 2023-07-14 NOTE — Assessment & Plan Note (Signed)
She has been participating in a clinical trial for chronic cough in [place].  I suspect she was in the treatment arm because her cough has been better for a year and a half.  The medication was stopped because she had some elevated LFTs, unclear whether there was a relationship.  I do not know what the study drug is.  Her cough is starting to return, can occur at night, dry.  I will start her back on empiric loratadine and pantoprazole.  Try cough suppression with Hycodan and Tessalon Perles.  Check a chest x-ray today to ensure no interval change.  If cough continues we will reinstitute workup, probably repeat her PFT, consider airway inspection.

## 2023-07-14 NOTE — Addendum Note (Signed)
Addended by: Glynda Jaeger on: 07/14/2023 09:24 AM   Modules accepted: Orders

## 2023-08-05 ENCOUNTER — Other Ambulatory Visit: Payer: Self-pay | Admitting: Emergency Medicine

## 2023-08-24 IMAGING — DX DG CHEST 1V PORT
1 series · 1 of 1 positions shown · non-contrast
Comparison: Chest x-ray 06/02/2021.

CLINICAL DATA: 70-year-old female with history of cough. COVID
positive patient.

EXAM:
PORTABLE CHEST 1 VIEW

[chest ap]
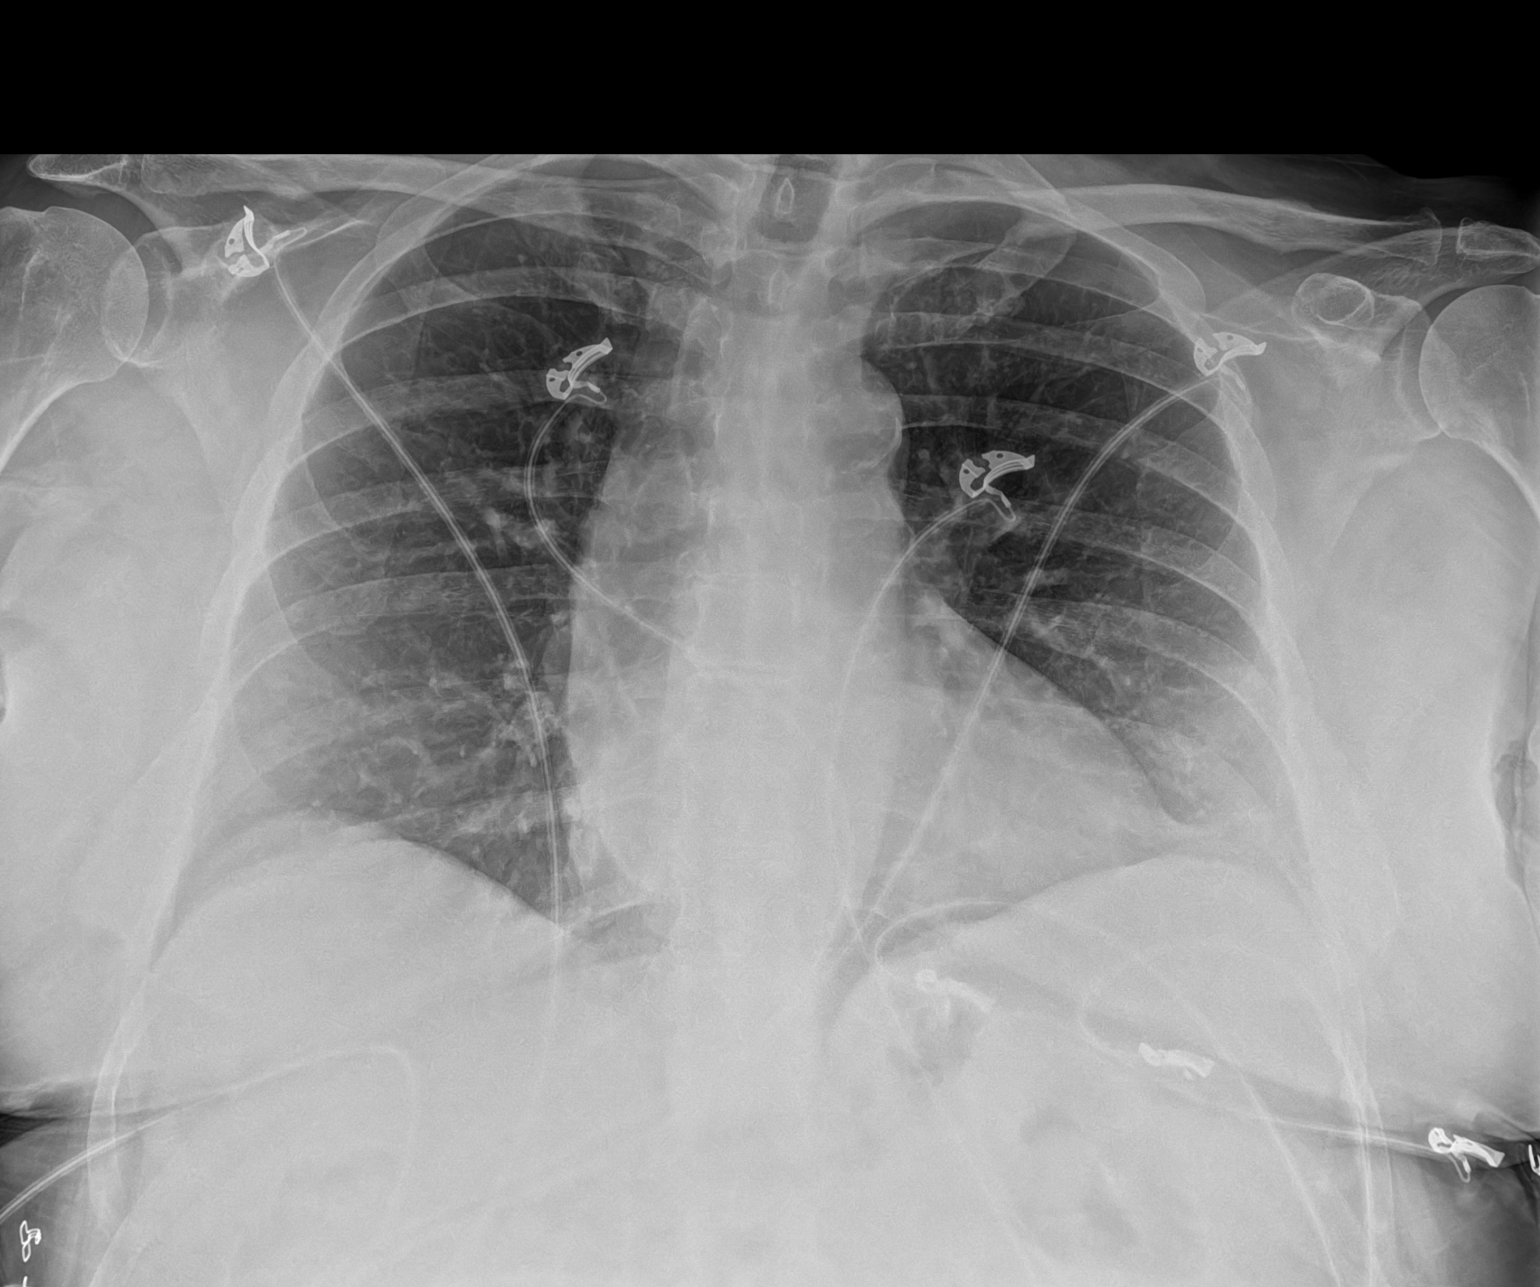

[1 of 1 positions shown; findings below may reference images not displayed]

FINDINGS: Lung volumes are normal. No consolidative airspace disease. No
pleural effusions. No pneumothorax. No pulmonary nodule or mass
noted. Pulmonary vasculature and the cardiomediastinal silhouette
are within normal limits. Atherosclerosis in the thoracic aorta.
Bilateral breast implants with capsular calcifications incidentally
noted.
IMPRESSION: 1.  No radiographic evidence of acute cardiopulmonary disease.
2. Aortic atherosclerosis.

## 2023-09-01 ENCOUNTER — Other Ambulatory Visit (HOSPITAL_COMMUNITY): Payer: Medicare HMO

## 2023-09-01 ENCOUNTER — Ambulatory Visit (HOSPITAL_COMMUNITY): Payer: Medicare HMO | Attending: Cardiology

## 2023-09-01 DIAGNOSIS — I77810 Thoracic aortic ectasia: Secondary | ICD-10-CM | POA: Diagnosis not present

## 2023-09-01 DIAGNOSIS — I2510 Atherosclerotic heart disease of native coronary artery without angina pectoris: Secondary | ICD-10-CM | POA: Diagnosis not present

## 2023-09-01 LAB — ECHOCARDIOGRAM COMPLETE
Area-P 1/2: 5.25 cm2
S' Lateral: 1.9 cm

## 2023-09-09 ENCOUNTER — Ambulatory Visit: Payer: Medicare HMO | Attending: Cardiology | Admitting: Cardiology

## 2023-09-09 ENCOUNTER — Encounter: Payer: Self-pay | Admitting: Cardiology

## 2023-09-09 VITALS — BP 120/80 | HR 55 | Ht 64.0 in | Wt 174.0 lb

## 2023-09-09 DIAGNOSIS — I890 Lymphedema, not elsewhere classified: Secondary | ICD-10-CM

## 2023-09-09 DIAGNOSIS — I77810 Thoracic aortic ectasia: Secondary | ICD-10-CM

## 2023-09-09 DIAGNOSIS — I2510 Atherosclerotic heart disease of native coronary artery without angina pectoris: Secondary | ICD-10-CM

## 2023-09-09 DIAGNOSIS — E782 Mixed hyperlipidemia: Secondary | ICD-10-CM

## 2023-09-09 DIAGNOSIS — Z01812 Encounter for preprocedural laboratory examination: Secondary | ICD-10-CM

## 2023-09-09 NOTE — Patient Instructions (Addendum)
Medication Instructions:  The current medical regimen is effective;  continue present plan and medications.  *If you need a refill on your cardiac medications before your next appointment, please call your pharmacy*   Lab Work: Please have blood work today (BMP)  If you have labs (blood work) drawn today and your tests are completely normal, you will receive your results only by: MyChart Message (if you have MyChart) OR A paper copy in the mail If you have any lab test that is abnormal or we need to change your treatment, we will call you to review the results.   Testing/Procedures: Your provider has requested you to be scheduled for CTA chest/aorta.  This will be completed at [facility]  Follow-Up: At [facility], you and your health needs are our priority.  As part of our continuing mission to provide you with exceptional heart care, we have created designated Provider Care Teams.  These Care Teams include your primary Cardiologist (physician) and Advanced Practice Providers (APPs -  Physician Assistants and Nurse Practitioners) who all work together to provide you with the care you need, when you need it.  We recommend signing up for the patient portal called "MyChart".  Sign up information is provided on this After Visit Summary.  MyChart is used to connect with patients for Virtual Visits (Telemedicine).  Patients are able to view lab/test results, encounter notes, upcoming appointments, etc.  Non-urgent messages can be sent to your provider as well.   To learn more about what you can do with MyChart, go to [email or web address].    Your next appointment:   1 year(s)  Provider:   Donato Schultz, MD

## 2023-09-09 NOTE — Progress Notes (Signed)
Cardiology Office Note:  .   Date:  09/09/2023  ID:  Patricia [name], DOB 1951/09/15, MRN [id number] PCP: Lupita Raider, MD  [facility] Providers Cardiologist:  Donato Schultz, MD     History of Present Illness: .   Patricia [name] is a 72 y.o. female Discussed with the use of AI scribe   History of Present Illness   The patient, a 72 year old individual with a history of dilated ascending aorta, coronary artery disease, and lower extremity lymphedema, presents for follow-up. The most recent echocardiogram showed an increase in the size of the ascending aorta from 43mm to 47mm. The patient's ejection fraction was within normal limits at 65-70%. A CT scan of the coronary arteries performed earlier in the year revealed a calcium score of 348, placing the patient in the 88th percentile, with minimal plaque in the LAD, moderate plaque in the Ramus, and minimal plaque in the RCA.  The patient has been on Crestor since the coronary CT scan and reports rare palpitations. She has been taking Toprol for palpitations and triamterene hydrochlorothiazide, Thiazide, Toprol, and Norvasc for well-controlled hypertension. The patient also takes aspirin as part of her prevention strategy.  The patient has had issues with lower extremity edema and has been under the care of vascular surgery. Conservative management strategies, including compression stockings and leg elevation, have been implemented for lymphedema.  The patient reports occasional chest pain that radiates to the back and jaw, similar to previous symptoms that brought her to medical attention. These episodes are periodic and self-resolving. The patient denies any shortness of breath.  The patient recently experienced an increase in blood pressure after discontinuing her medications for a week due to illness following a cruise. The patient has since resumed her medications.            Studies Reviewed: Marland Kitchen   EKG  Interpretation Date/Time:  Friday September 09 2023 10:40:28 EST Ventricular Rate:  55 PR Interval:  176 QRS Duration:  84 QT Interval:  418 QTC Calculation: 399 R Axis:   -11  Text Interpretation: Sinus bradycardia Possible Left atrial enlargement When compared with ECG of 23-Oct-2011 23:37, Vent. rate has decreased BY  38 BPM Non-specific change in ST segment in Inferior leads Confirmed by Donato Schultz ([id number]) on 09/09/2023 10:42:25 AM    Results LABS LDL: 76 (05/12/2023) HbA1c: 5.1 Hb: 14.2 Cr: 0.76  RADIOLOGY Coronary CT: Calcium score 348 (88th percentile), minimal LAD plaque, moderate Ramus plaque, minimal RCA plaque, ascending aorta 43 mm (05/19/2020)  DIAGNOSTIC Echocardiogram: Dilated ascending aorta 47 mm, ejection fraction 65-70% (09/01/2023)  Risk Assessment/Calculations:            Physical Exam:   VS:  BP 120/80 (BP Location: Left Arm, Patient Position: Sitting, Cuff Size: Normal)   Pulse (!) 55   Ht 5\' 4"  (1.626 m)   Wt 174 lb (78.9 kg)   BMI 29.87 kg/m    Wt Readings from Last 3 Encounters:  09/09/23 174 lb (78.9 kg)  07/14/23 174 lb (78.9 kg)  08/24/22 194 lb (88 kg)    GEN: Well nourished, well developed in no acute distress NECK: No JVD; No carotid bruits CARDIAC: RRR, no murmurs, no rubs, no gallops RESPIRATORY:  Clear to auscultation without rales, wheezing or rhonchi  ABDOMEN: Soft, non-tender, non-distended EXTREMITIES:  No edema; No deformity   ASSESSMENT AND PLAN: .    Assessment and Plan    Dilated Ascending Aorta Dilated ascending aorta measuring 47 mm on  recent echocardiogram, increased from 43 mm in 2022. Ejection fraction 65-70%. Asymptomatic with no chest pain or shortness of breath. Surgical intervention not indicated at this size; close monitoring required. Discussed that surgery typically considered at 5.0-5.5 cm. CTA planned to confirm measurement and guide future monitoring. - Order CTA of the aorta to confirm the measurement -  Monitor closely for any changes in size  Coronary Artery Disease Coronary artery disease with calcium score of 348 (88th percentile) and minimal LAD plaque, moderate Ramus plaque, and minimal RCA plaque on 2021 CT scan. Non-flow limiting disease. On aspirin and Crestor for prevention; LDL 76 mg/dL as of 8/65/7846. Reports rare palpitations and previous chest fullness attributed to GERD. - Continue aspirin 81 mg daily - Continue Crestor 20 mg daily - Continue Toprol 50 mg daily for palpitations - Monitor lipid levels regularly  Hypertension Hypertension well-controlled with triamterene hydrochlorothiazide, Toprol, and Norvasc. Temporary increase in blood pressure to 146 mmHg after discontinuing medications for a week due to illness. - Continue triamterene hydrochlorothiazide - Continue Toprol 50 mg daily - Continue Norvasc - Encourage adherence to medication regimen - Advise on diet and exercise for blood pressure management  Lymphedema Lower extremity edema managed conservatively with compression stockings, leg elevation, and vascular surgery consultation. - Continue conservative management with compression stockings and leg elevation - Follow up with vascular surgery as needed  General Health Maintenance Normal hemoglobin (14.2), creatinine (0.76), and hemoglobin A1c (5.1). - Encourage regular physical activity - Maintain a healthy diet - Continue routine health screenings  Follow-up - Schedule follow-up appointment after CTA results are available.               Signed, Donato Schultz, MD

## 2023-09-10 LAB — BASIC METABOLIC PANEL
BUN/Creatinine Ratio: 15 (ref 12–28)
BUN: 14 mg/dL (ref 8–27)
CO2: 28 mmol/L (ref 20–29)
Calcium: 10.1 mg/dL (ref 8.7–10.3)
Chloride: 100 mmol/L (ref 96–106)
Creatinine, Ser: 0.92 mg/dL (ref 0.57–1.00)
Glucose: 92 mg/dL (ref 70–99)
Potassium: 5.2 mmol/L (ref 3.5–5.2)
Sodium: 142 mmol/L (ref 134–144)
eGFR: 66 mL/min/{1.73_m2} (ref >59)

## 2023-09-13 ENCOUNTER — Ambulatory Visit: Payer: Medicare HMO | Admitting: Primary Care

## 2023-09-13 NOTE — Progress Notes (Deleted)
@Patient  ID: Patricia [name], female    DOB: December 29, 1950, 72 y.o.   MRN: [id number]  No chief complaint on file.   Referring provider: Lupita Raider, MD  HPI: 72 year old female, former smoker.  Past medical history significant for hypertension, fatty liver, GERD, cough.  Patient of Dr. Delton Coombes, last seen in September 2024.  Previous [facility] pulmonary encounter:  ROV 07/21/21 --this follow-up visit for evaluation of chronic cough and upper airway irritation.  She is 36 with a former tobacco history, hypertension, hyperlipidemia, depression, lymphedema.  She had a globus sensation.  I increased Nexium to pantoprazole twice daily, started loratadine and fluticasone nasal spray.  She underwent pulmonary function testing today Her GERD sx are less, as is her cough. Her nasal congestion is also better.   Pulmonary function testing performed today, reviewed by me, shows normal airflows without a bronchodilator response, decreased residual volume consistent with possible restriction (gas dilution), normal diffusion capacity.  Chest x-ray 06/02/2021 reviewed by me, showed mild right basilar atelectasis. CT chest with some basilar interstitial change as below.   ROV 07/14/2023 --Patricia [name] is 62 with history of former tobacco use, hypertension, hyperlipidemia, depression, lymphedema.  I have seen her in the past for chronic upper airway irritation, globus sensation and chronic cough.  Her pulmonary function testing shows normal airflows and a normal diffusion capacity (07/2021).  Her imaging has been reassuring in the past.  Since I last saw her her cough had been improved for an extended time - she had been participating in a study for cough for 1.5 yrs and her cough was much improved. She stopped the medication about 2 months ago because her LFT were elevated.  She began to have recurrence beginning she has started to have recurrent dry cough, paroxysmal during the day. Better with taking drink of water. Can  wake her at night. She is not feeling overt GERD. No significant nasal congestion.  Has been on loratadine, fluticasone nasal spray, PPI in the past not currently.    Chronic cough She has been participating in a clinical trial for chronic cough in [place].  I suspect she was in the treatment arm because her cough has been better for a year and a half.  The medication was stopped because she had some elevated LFTs, unclear whether there was a relationship.  I do not know what the study drug is.  Her cough is starting to return, can occur at night, dry.  I will start her back on empiric loratadine and pantoprazole.  Try cough suppression with Hycodan and Tessalon Perles.  Check a chest x-ray today to ensure no interval change.  If cough continues we will reinstitute workup, probably repeat her PFT, consider airway inspection.  09/13/2023- Interim hx  Patient presents today for 58-month follow-up/chronic cough.         Allergies  Allergen Reactions   Ciprofloxacin Other (See Comments)    Pt has a descending heart aneurism and has been instructed not to take Cipro.    Clindamycin/Lincomycin Other (See Comments)    Stomach pain   Morphine     REACTION: itch   Tetracyclines & Related Other (See Comments)    Stomach pain    Immunization History  Administered Date(s) Administered   Influenza Split 07/11/2012, 07/25/2017   Influenza, High Dose Seasonal PF 07/25/2017, 06/22/2019, 08/13/2019   Influenza,inj,Quad PF,6+ Mos 08/07/2018   Influenza,inj,quad, With Preservative 08/13/2014   Influenza-Unspecified 08/14/2015, 06/29/2016, 08/06/2016, 08/06/2016, 08/07/2018, 08/07/2020   PFIZER(Purple Top)SARS-COV-2 Vaccination 11/09/2019,  11/30/2019, 08/02/2020   Pneumococcal Conjugate-13 10/06/2017   Pneumococcal Polysaccharide-23 10/24/2018   Rabies Immune Globulin 01/19/2015, 01/22/2015, 01/26/2015, 02/02/2015, 09/07/2016, 05/06/2018, 05/10/2018   Rabies, IM 01/19/2015, 01/22/2015,  01/26/2015, 02/02/2015, 09/04/2016, 09/07/2016, 05/06/2018, 05/10/2018   Td 11/01/1998   Tdap 01/30/2009, 01/19/2015, 06/06/2021    Past Medical History:  Diagnosis Date   Aneurysm of aorta (HCC)    Arthritis    Atrophic vaginitis    Cellulitis and abscess of right leg 05/07/2018   Depression    Diverticulitis    DYSPNEA 06/04/2009   Qualifier: Diagnosis of  By: Vassie Loll MD, [place]er Locket.     Edema of both legs    She takes Lasix a couple of times per week to decrease swelling.     Fatty liver    Hyperlipidemia    Hypertension    Keratosis    hands and lower limbs   Lymphedema    legs   Memory difficulty    Obesity    Reflux    Shingles    Venous stasis    lower legs    Tobacco History: Social History   Tobacco Use  Smoking Status Former   Current packs/day: 1.00   Average packs/day: 1 pack/day for 25.0 years (25.0 ttl pk-yrs)   Types: Cigarettes  Smokeless Tobacco Never   Counseling given: Not Answered   Outpatient Medications Prior to Visit  Medication Sig Dispense Refill   ALPRAZolam (XANAX) 1 MG tablet Take 1 mg by mouth as needed.     amLODipine (NORVASC) 5 MG tablet Take 5 mg by mouth daily.     amphetamine-dextroamphetamine (ADDERALL) 10 MG tablet Take 10 mg by mouth daily with breakfast.     aspirin EC 81 MG tablet Take 1 tablet (81 mg total) by mouth daily. Swallow whole. 90 tablet 3   benzonatate (TESSALON) 100 MG capsule Take 1 capsule (100 mg total) by mouth every 6 (six) hours as needed for cough. 30 capsule 3   desvenlafaxine (PRISTIQ) 50 MG 24 hr tablet Take 50 mg by mouth daily.     fluticasone (FLONASE) 50 MCG/ACT nasal spray instill 2 sprays into each nostril once daily 16 g 0   loratadine (CLARITIN) 10 MG tablet Take 1 tablet (10 mg total) by mouth daily. 30 tablet 5   metoprolol succinate (TOPROL-XL) 50 MG 24 hr tablet TAKE ONE TABLET BY MOUTH ONE TIME DAILY WITH OR IMMEDIATELY FOLLOWING A MEAL 90 tablet 3   pantoprazole (PROTONIX) 40 MG tablet TAKE 1  TABLET BY MOUTH 1 HOUR BEFORE EATING TWICE A DAY UNTIL NEXT OFFICE VISIT 180 tablet 1   rosuvastatin (CRESTOR) 20 MG tablet TAKE ONE TABLET BY MOUTH ONE TIME DAILY 90 tablet 3   triamterene-hydrochlorothiazide (MAXZIDE-25) 37.5-25 MG per tablet Take 1 tablet by mouth daily.       No facility-administered medications prior to visit.      Review of Systems  Review of Systems   Physical Exam  There were no vitals taken for this visit. Physical Exam   Lab Results:  CBC    Component Value Date/Time   WBC 5.5 03/07/2022 0501   RBC 4.85 03/07/2022 0501   HGB 14.5 03/07/2022 0501   HCT 44.5 03/07/2022 0501   PLT 205 03/07/2022 0501   MCV 91.8 03/07/2022 0501   MCH 29.9 03/07/2022 0501   MCHC 32.6 03/07/2022 0501   RDW 14.0 03/07/2022 0501   LYMPHSABS 1.6 05/06/2018 1830   MONOABS 0.4 05/06/2018 1830   EOSABS 0.1  05/06/2018 1830   BASOSABS 0.1 05/06/2018 1830    BMET    Component Value Date/Time   NA 142 09/09/2023 1125   K 5.2 09/09/2023 1125   CL 100 09/09/2023 1125   CO2 28 09/09/2023 1125   GLUCOSE 92 09/09/2023 1125   GLUCOSE 123 (H) 03/07/2022 0501   BUN 14 09/09/2023 1125   CREATININE 0.92 09/09/2023 1125   CALCIUM 10.1 09/09/2023 1125   GFRNONAA >60 03/07/2022 0501   GFRAA 105 05/16/2020 1034    BNP No results found for: "BNP"  ProBNP No results found for: "PROBNP"  Imaging: ECHOCARDIOGRAM COMPLETE  Result Date: 09/01/2023    ECHOCARDIOGRAM REPORT   Patient Name:   Patricia [name] [facility] Date of Exam: 09/01/2023 Medical Rec #:  [id number]           Height:       64.0 in Accession #:    [id number]          Weight:       174.0 lb Date of Birth:  [birth year]           BSA:          1.844 m Patient Age:    72 years            BP:           130/80 mmHg Patient Gender: F                   HR:           98 bpm. Exam Location:  [address] Procedure: 2D Echo, Cardiac Doppler and Color Doppler Indications:    I77.810 Dilated Aortic Root  History:        Patient has  prior history of Echocardiogram examinations, most                 recent 05/21/2021. Signs/Symptoms:Dyspnea; Risk                 Factors:Hypertension and Dyslipidemia. Lower extremity edema.  Sonographer:    Sedonia Small Rodgers-Jones RDCS Referring Phys: [id number] MARK C SKAINS IMPRESSIONS  1. Left ventricular ejection fraction, by estimation, is 65 to 70%. The left ventricle has normal function. The left ventricle has no regional wall motion abnormalities. There is mild concentric left ventricular hypertrophy. Left ventricular diastolic parameters were normal.  2. Right ventricular systolic function is normal. The right ventricular size is normal.  3. The mitral valve is normal in structure. No evidence of mitral valve regurgitation. No evidence of mitral stenosis.  4. The aortic valve is normal in structure. Aortic valve regurgitation is not visualized. No aortic stenosis is present.  5. Aortic dilatation noted. There is dilatation of the ascending aorta, measuring 47 mm.  6. The inferior vena cava is normal in size with greater than 50% respiratory variability, suggesting right atrial pressure of 3 mmHg. FINDINGS  Left Ventricle: Left ventricular ejection fraction, by estimation, is 65 to 70%. The left ventricle has normal function. The left ventricle has no regional wall motion abnormalities. The left ventricular internal cavity size was small. There is mild concentric left ventricular hypertrophy. Left ventricular diastolic parameters were normal. Right Ventricle: The right ventricular size is normal. No increase in right ventricular wall thickness. Right ventricular systolic function is normal. Left Atrium: Left atrial size was normal in size. Right Atrium: Right atrial size was normal in size. Pericardium: There is no evidence of pericardial effusion. Mitral Valve: The mitral valve is normal in  structure. No evidence of mitral valve regurgitation. No evidence of mitral valve stenosis. Tricuspid Valve: The tricuspid  valve is normal in structure. Tricuspid valve regurgitation is not demonstrated. No evidence of tricuspid stenosis. Aortic Valve: The aortic valve is normal in structure. Aortic valve regurgitation is not visualized. No aortic stenosis is present. Pulmonic Valve: The pulmonic valve was normal in structure. Pulmonic valve regurgitation is not visualized. No evidence of pulmonic stenosis. Aorta: The aortic root is normal in size and structure and aortic dilatation noted. There is dilatation of the ascending aorta, measuring 47 mm. Venous: The inferior vena cava is normal in size with greater than 50% respiratory variability, suggesting right atrial pressure of 3 mmHg. IAS/Shunts: No atrial level shunt detected by color flow Doppler.  LEFT VENTRICLE PLAX 2D LVIDd:         2.90 cm   Diastology LVIDs:         1.90 cm   LV e' medial:    6.49 cm/s LV PW:         1.00 cm   LV E/e' medial:  8.2 LV IVS:        1.30 cm   LV e' lateral:   11.77 cm/s LVOT diam:     2.00 cm   LV E/e' lateral: 4.5 LV SV:         74 LV SV Index:   40 LVOT Area:     3.14 cm  RIGHT VENTRICLE             IVC RV Basal diam:  3.70 cm     IVC diam: 0.80 cm RV S prime:     16.65 cm/s TAPSE (M-mode): 2.5 cm LEFT ATRIUM             Index        RIGHT ATRIUM           Index LA diam:        3.80 cm 2.06 cm/m   RA Area:     10.50 cm LA Vol (A2C):   67.9 ml 36.82 ml/m  RA Volume:   22.50 ml  12.20 ml/m LA Vol (A4C):   24.8 ml 13.45 ml/m LA Biplane Vol: 43.7 ml 23.70 ml/m  AORTIC VALVE LVOT Vmax:   155.25 cm/s LVOT Vmean:  104.750 cm/s LVOT VTI:    0.236 m  AORTA Ao Root diam: 3.30 cm Ao Asc diam:  4.70 cm MITRAL VALVE                TRICUSPID VALVE MV Area (PHT): 5.25 cm     TR Peak grad:   19.9 mmHg MV Decel Time: 145 msec     TR Vmax:        223.00 cm/s MV E velocity: 53.00 cm/s MV A velocity: 116.00 cm/s  SHUNTS MV E/A ratio:  0.46         Systemic VTI:  0.24 m                             Systemic Diam: 2.00 cm Aditya Sabharwal Electronically signed  by Dorthula Nettles Signature Date/Time: 09/01/2023/1:42:55 PM    Final      Assessment & Plan:   No problem-specific Assessment & Plan notes found for this encounter.     Glenford Bayley, NP 09/13/2023

## 2023-09-19 ENCOUNTER — Ambulatory Visit (HOSPITAL_COMMUNITY)
Admission: RE | Admit: 2023-09-19 | Discharge: 2023-09-19 | Disposition: A | Discharge status: Home / Self Care | Payer: Medicare HMO | Source: Ambulatory Visit | Attending: Cardiology | Admitting: Cardiology

## 2023-09-19 DIAGNOSIS — I77810 Thoracic aortic ectasia: Secondary | ICD-10-CM | POA: Diagnosis present

## 2023-09-19 MED ORDER — IOHEXOL 350 MG/ML SOLN
75.0000 mL | Freq: Once | INTRAVENOUS | Status: AC | PRN
  Administered 2023-09-19: 75 mL via INTRAVENOUS

## 2023-09-19 MED ORDER — SODIUM CHLORIDE (PF) 0.9 % IJ SOLN
INTRAMUSCULAR | Status: AC
  Filled 2023-09-19: qty 50

## 2023-09-26 ENCOUNTER — Encounter: Payer: Self-pay | Admitting: Cardiology

## 2023-09-29 ENCOUNTER — Other Ambulatory Visit: Payer: Self-pay | Admitting: *Deleted

## 2023-09-29 DIAGNOSIS — I77810 Thoracic aortic ectasia: Secondary | ICD-10-CM

## 2023-09-29 NOTE — Progress Notes (Signed)
  Ascending Aorta: 4.5 cm Stable Repeat CTA chest with contrast in one year Continue to keep stable blood pressure  Written by Jake Bathe, MD on 09/26/2023  8:07 PM EST

## 2023-10-04 ENCOUNTER — Ambulatory Visit: Payer: Self-pay | Admitting: General Surgery

## 2023-10-04 ENCOUNTER — Telehealth: Payer: Self-pay | Admitting: Cardiology

## 2023-10-04 DIAGNOSIS — K8020 Calculus of gallbladder without cholecystitis without obstruction: Secondary | ICD-10-CM

## 2023-10-04 NOTE — Telephone Encounter (Signed)
   Pre-operative Risk Assessment  Last visit: 09/09/2023 Next visit: none  Patient Name: Patricia [name]  DOB: June 10, 1951 MRN: [id number]      Request for Surgical Clearance    Procedure:   gallbladder surgery  Date of Surgery:  Clearance TBD                                 Surgeon:  Dr. Chevis Pretty, III Surgeon's Group or Practice Name:  [facility] Phone number:  [phone] Fax number:  [phone]   Type of Clearance Requested:   - Pharmacy:  Hold Aspirin Need instructions as to how to hold medication preoperatively   Type of Anesthesia:  General    Additional requests/questions:  Please advise surgeon/provider what medications should be held.  Signed, Royann Shivers   10/04/2023, 2:42 PM

## 2023-10-05 NOTE — Telephone Encounter (Signed)
   Name: Patricia [name]  DOB: [birth year]  MRN: [id number]   Primary Cardiologist: Donato Schultz, MD  Chart reviewed as part of pre-operative protocol coverage. Patient was contacted 10/05/2023 in reference to pre-operative risk assessment for pending surgery as outlined below.  No answer. Left message for pt to call back at earliest convenience.   Per office protocol, if pt is without any new symptoms or concerns, she may hold Aspirin for 5-7 days prior to procedure. Please resume Aspirin as soon as possible postprocedure, at the discretion of the surgeon.     Joylene Grapes, NP 10/05/2023, 8:13 AM

## 2023-10-10 NOTE — Telephone Encounter (Signed)
     Primary Cardiologist: Donato Schultz, MD  Chart reviewed as part of pre-operative protocol coverage. Given past medical history and time since last visit, based on ACC/AHA guidelines, DILA HOPWOOD would be at acceptable risk for the planned procedure without further cardiovascular testing.   Patient was advised that if she develops new symptoms prior to surgery to contact our office to arrange a follow-up appointment.  He verbalized understanding.  Per office protocol, if pt is without any new symptoms or concerns, she may hold Aspirin for 5-7 days prior to procedure. Please resume Aspirin as soon as possible postprocedure, at the discretion of the surgeon.    I will route this recommendation to the requesting party via Epic fax function and remove from pre-op pool.  Please call with questions.  Thomasene Ripple. Layci Stenglein NP-C     10/10/2023, 10:35 AM [facility] [address] Office [phone] Fax [phone]

## 2023-10-26 NOTE — Progress Notes (Signed)
 Surgical Instructions   Your procedure is scheduled on Wednesday, 11/02/23. Report to [facility] Main Entrance A at 11:00 A.M., then check in with the Admitting office. Any questions or running late day of surgery: call [phone]  Questions prior to your surgery date: call [phone], Monday-Friday, 8am-4pm. If you experience any cold or flu symptoms such as cough, fever, chills, shortness of breath, etc. between now and your scheduled surgery, please notify us  at the above number.     Remember:  Do not eat after midnight the night before your surgery  You may drink clear liquids until 10:00am the morning of your surgery.   Clear liquids allowed are: Water, Non-Citrus Juices (without pulp), Carbonated Beverages, Clear Tea (no milk, honey, etc.), Black Coffee Only (NO MILK, CREAM OR POWDERED CREAMER of any kind), and Gatorade.    Take these medicines the morning of surgery with A SIP OF WATER  amLODipine  (NORVASC )  metoprolol  succinate (TOPROL -XL)  rosuvastatin  (CRESTOR )   May take these medicines IF NEEDED: acetaminophen  (TYLENOL )  ALPRAZolam  (XANAX )  desvenlafaxine (PRISTIQ)  dicyclomine (BENTYL)  pantoprazole  (PROTONIX )   One week prior to surgery, STOP taking any Aspirin  (unless otherwise instructed by your surgeon) Aleve, Naproxen, Ibuprofen , Motrin , Advil , Goody's, BC's, all herbal medications, fish oil, and non-prescription vitamins.  PLEASE DO NOT TAKE WEGOVY ONE WEEK PRIOR TO SURGERY. DO NOT TAKE A DOSE AFTER 10/25/23.                     Do NOT Smoke (Tobacco/Vaping) for 24 hours prior to your procedure.  If you use a CPAP at night, you may bring your mask/headgear for your overnight stay.   You will be asked to remove any contacts, glasses, piercing's, hearing aid's, dentures/partials prior to surgery. Please bring cases for these items if needed.    Patients discharged the day of surgery will not be allowed to drive home, and someone needs to stay with them for  24 hours.  SURGICAL WAITING ROOM VISITATION Patients may have no more than 2 support people in the waiting area - these visitors may rotate.   Pre-op nurse will coordinate an appropriate time for 1 ADULT support person, who may not rotate, to accompany patient in pre-op.  Children under the age of 57 must have an adult with them who is not the patient and must remain in the main waiting area with an adult.  If the patient needs to stay at the hospital during part of their recovery, the visitor guidelines for inpatient rooms apply.  Please refer to the [facility] website for the visitor guidelines for any additional information.   If you received a COVID test during your pre-op visit  it is requested that you wear a mask when out in public, stay away from anyone that may not be feeling well and notify your surgeon if you develop symptoms. If you have been in contact with anyone that has tested positive in the last 10 days please notify you surgeon.      Pre-operative CHG Bathing Instructions   You can play a key role in reducing the risk of infection after surgery. Your skin needs to be as free of germs as possible. You can reduce the number of germs on your skin by washing with CHG (chlorhexidine  gluconate) soap before surgery. CHG is an antiseptic soap that kills germs and continues to kill germs even after washing.   DO NOT use if you have an allergy to chlorhexidine /CHG or  antibacterial soaps. If your skin becomes reddened or irritated, stop using the CHG and notify one of our RNs at [phone].              TAKE A SHOWER THE NIGHT BEFORE SURGERY AND THE DAY OF SURGERY    Please keep in mind the following:  DO NOT shave, including legs and underarms, 48 hours prior to surgery.   You may shave your face before/day of surgery.  Place clean sheets on your bed the night before surgery Use a clean washcloth (not used since being washed) for each shower. DO NOT sleep with pet's night  before surgery.  CHG Shower Instructions:  Wash your face and private area with normal soap. If you choose to wash your hair, wash first with your normal shampoo.  After you use shampoo/soap, rinse your hair and body thoroughly to remove shampoo/soap residue.  Turn the water OFF and apply half the bottle of CHG soap to a CLEAN washcloth.  Apply CHG soap ONLY FROM YOUR NECK DOWN TO YOUR TOES (washing for 3-5 minutes)  DO NOT use CHG soap on face, private areas, open wounds, or sores.  Pay special attention to the area where your surgery is being performed.  If you are having back surgery, having someone wash your back for you may be helpful. Wait 2 minutes after CHG soap is applied, then you may rinse off the CHG soap.  Pat dry with a clean towel  Put on clean pajamas    Additional instructions for the day of surgery: DO NOT APPLY any lotions, deodorants, cologne, or perfumes.   Do not wear jewelry or makeup Do not wear nail polish, gel polish, artificial nails, or any other type of covering on natural nails (fingers and toes) Do not bring valuables to the hospital. [facility] is not responsible for valuables/personal belongings. Put on clean/comfortable clothes.  Please brush your teeth.  Ask your nurse before applying any prescription medications to the skin.

## 2023-10-27 ENCOUNTER — Encounter (HOSPITAL_COMMUNITY)
Admission: RE | Admit: 2023-10-27 | Discharge: 2023-10-27 | Disposition: A | Discharge status: Home / Self Care | Payer: HMO | Source: Ambulatory Visit | Attending: General Surgery | Admitting: General Surgery

## 2023-10-27 ENCOUNTER — Other Ambulatory Visit: Payer: Self-pay

## 2023-10-27 ENCOUNTER — Encounter (HOSPITAL_COMMUNITY): Payer: Self-pay

## 2023-10-27 VITALS — BP 136/75 | HR 88 | Temp 98.3°F | Resp 18 | Ht 64.0 in | Wt 177.1 lb

## 2023-10-27 DIAGNOSIS — Z87891 Personal history of nicotine dependence: Secondary | ICD-10-CM | POA: Diagnosis not present

## 2023-10-27 DIAGNOSIS — I2510 Atherosclerotic heart disease of native coronary artery without angina pectoris: Secondary | ICD-10-CM | POA: Diagnosis not present

## 2023-10-27 DIAGNOSIS — Z96652 Presence of left artificial knee joint: Secondary | ICD-10-CM | POA: Insufficient documentation

## 2023-10-27 DIAGNOSIS — K219 Gastro-esophageal reflux disease without esophagitis: Secondary | ICD-10-CM | POA: Diagnosis not present

## 2023-10-27 DIAGNOSIS — I7121 Aneurysm of the ascending aorta, without rupture: Secondary | ICD-10-CM | POA: Insufficient documentation

## 2023-10-27 DIAGNOSIS — Z01818 Encounter for other preprocedural examination: Secondary | ICD-10-CM | POA: Diagnosis present

## 2023-10-27 DIAGNOSIS — I890 Lymphedema, not elsewhere classified: Secondary | ICD-10-CM | POA: Insufficient documentation

## 2023-10-27 DIAGNOSIS — Z7982 Long term (current) use of aspirin: Secondary | ICD-10-CM | POA: Diagnosis not present

## 2023-10-27 DIAGNOSIS — K8020 Calculus of gallbladder without cholecystitis without obstruction: Secondary | ICD-10-CM | POA: Diagnosis not present

## 2023-10-27 DIAGNOSIS — Z01812 Encounter for preprocedural laboratory examination: Secondary | ICD-10-CM | POA: Insufficient documentation

## 2023-10-27 DIAGNOSIS — K760 Fatty (change of) liver, not elsewhere classified: Secondary | ICD-10-CM | POA: Diagnosis not present

## 2023-10-27 DIAGNOSIS — E785 Hyperlipidemia, unspecified: Secondary | ICD-10-CM | POA: Insufficient documentation

## 2023-10-27 DIAGNOSIS — I10 Essential (primary) hypertension: Secondary | ICD-10-CM | POA: Diagnosis not present

## 2023-10-27 HISTORY — DX: Gastro-esophageal reflux disease without esophagitis: K21.9

## 2023-10-27 HISTORY — DX: Personal history of urinary calculi: Z87.442

## 2023-10-27 LAB — CBC
HCT: 44.9 % (ref 36.0–46.0)
Hemoglobin: 15.2 g/dL — ABNORMAL HIGH (ref 12.0–15.0)
MCH: 31.5 pg (ref 26.0–34.0)
MCHC: 33.9 g/dL (ref 30.0–36.0)
MCV: 93 fL (ref 80.0–100.0)
Platelets: 265 10*3/uL (ref 150–400)
RBC: 4.83 MIL/uL (ref 3.87–5.11)
RDW: 12.5 % (ref 11.5–15.5)
WBC: 7.9 10*3/uL (ref 4.0–10.5)
nRBC: 0 % (ref 0.0–0.2)

## 2023-10-27 LAB — COMPREHENSIVE METABOLIC PANEL
ALT: 28 U/L (ref 0–44)
AST: 41 U/L (ref 15–41)
Albumin: 4 g/dL (ref 3.5–5.0)
Alkaline Phosphatase: 73 U/L (ref 38–126)
Anion gap: 10 (ref 5–15)
BUN: 12 mg/dL (ref 8–23)
CO2: 26 mmol/L (ref 22–32)
Calcium: 9.1 mg/dL (ref 8.9–10.3)
Chloride: 101 mmol/L (ref 98–111)
Creatinine, Ser: 0.71 mg/dL (ref 0.44–1.00)
GFR, Estimated: >60 mL/min (ref >60)
Glucose, Bld: 107 mg/dL — ABNORMAL HIGH (ref 70–99)
Potassium: 3.6 mmol/L (ref 3.5–5.1)
Sodium: 137 mmol/L (ref 135–145)
Total Bilirubin: 2.2 mg/dL — ABNORMAL HIGH (ref 0.0–1.2)
Total Protein: 6.9 g/dL (ref 6.5–8.1)

## 2023-10-27 NOTE — Progress Notes (Signed)
 PCP - Joen Gentry Cardiologist - Oneil Harder Pulmonologist: Lamar Chris  PPM/ICD - denies  Chest x-ray - n/a EKG - 09/09/23 Stress Test - 01/25/17 ECHO - 09/01/23 Cardiac Cath - denies  Sleep Study - no osa  7 days prior to surgery STOP taking any Aspirin  (unless otherwise instructed by your surgeon), Aleve, Naproxen, Ibuprofen , Motrin , Advil , Goody's, BC's, all herbal medications, fish oil, and all vitamins.   ERAS Protcol -yes PRE-SURGERY Ensure or G2- none ordered  COVID TEST- not needed   Anesthesia review: yes, has cardiology clearance  Patient denies shortness of breath, fever, cough and chest pain at PAT appointment   All instructions explained to the patient, with a verbal understanding of the material. Patient agrees to go over the instructions while at home for a better understanding. Patient also instructed to self quarantine after being tested for COVID-19. The opportunity to ask questions was provided.

## 2023-10-28 NOTE — Progress Notes (Signed)
 Anesthesia Chart Review:  Case: [id number] Date/Time: 11/02/23 1245   Procedure: LAPAROSCOPIC CHOLECYSTECTOMY WITH INTRAOPERATIVE CHOLANGIOGRAM - ASSIST: RNFA   Anesthesia type: General   Pre-op diagnosis: Gallstones   [facility] ROOM 02 / [facility] OR   Surgeons: Curvin Deward MOULD, MD       DISCUSSION: Patient is a 73 year old [name] scheduled for the above procedure.  History includes former smoker, HTN, HLD, CAD (elevated CAD 348, 88th percentile, no flow limiting CAD by CCTA FFA 05/2020),  ascending thoracic aortic aneurysm (4.5 cm 09/2023 CTA), dyspnea, chronic cough, LE edema/lymphedema, memory difficulty, fatty liver, GERD, skin cancer (SCC, BCC), osteoarthritis (left TKA 09/26/18)  She is followed by cardiologist Dr. Jeffrie for CAD (elevated CAD 348, 88th percentile, no flow limiting CAD by CCTA FFA 05/2020), palpitations, dilated ascending aorta, LE lymphedema.. Last office visit 09/09/23. Continue medical therapy for CAD. 09/01/23 echo showed LVEF 65-70%, no regional wall motion abnormalities, mild concentric LVH, normal diastolic parameters, normal RV systolic function, ascending aorta 47 mm, no AI or AS noted. CTA chest repeated on 09/19/23 and showed ascending TAA stable at 4.5 cm. One year follow-up planned.   Preoperative cardiology input outlined by Emelia Hazy, NP,  Given past medical history and time since last visit, based on ACC/AHA guidelines, [name] would be at acceptable risk for the planned procedure without further cardiovascular testing...  Per office protocol, if pt is without any new symptoms or concerns, she may hold Aspirin  for 5-7 days prior to procedure. Please resume Aspirin  as soon as possible postprocedure, at the discretion of the surgeon.  Last pulmonology visit with Dr. Shelah was on 07/14/23 for follow-up chronic upper airway irritation with chronic cough. In 2022, PFTs showed normal airflows and normal diffusion capacity. She hs been in a clinical  trial in [facility] for chronic cough for 1.5 years. Her cough had improved, so he suspected she was on the study drug but stopped 2 months ago due to elevation in her LFTs. She had developed some recurrence of dry cough and better after drinking water. No overt feelings of GERD and no significant nasal congestion. He started her back on empiric loratadine  and pantoprazole  and added Hycodan and Tessalon  Perles for cough suppression. CXR showed no active cardiopulmonary disease. If cough not improved will consider repeat PFTs and airway inspection.     Anesthesia team to evaluate on the day of surgery.   VS: BP 136/75   Pulse 88   Temp 36.8 C (Oral)   Resp 18   Ht 5' 4 (1.626 m)   Wt 80.3 kg   SpO2 95%   BMI 30.40 kg/m    PROVIDERS: Loreli Kins, MD is PCP  Jeffrie Anes, MD is cardiologist Shelah Charleston, MD is pulmonologist Sheree Riis, MD is vascular surgeon. Last visit 10/28/21 for venous disease with reflux in her GSV. She was asymptomatic, so compression stockings recommended with as needed follow-up.    LABS: Labs reviewed: Acceptable for surgery. (all labs ordered are listed, but only abnormal results are displayed)  Labs Reviewed  CBC - Abnormal; Notable for the following components:      Result Value   Hemoglobin 15.2 (*)    All other components within normal limits  COMPREHENSIVE METABOLIC PANEL - Abnormal; Notable for the following components:   Glucose, Bld 107 (*)    Total Bilirubin 2.2 (*)    All other components within normal limits    PFTs 07/21/21: FVC 2.82 (91%), post 2.79 (90%). FEV1  2.28 (97%), post 2.28 (97%). DLCO unc/cor 18.41 (91%).   IMAGES: CTA Chest/Aorta 09/19/23: IMPRESSION: 1. 4.5 cm ascending thoracic aortic aneurysm, previously 4.4 cm on 03/21/2019. Recommend semi-annual imaging followup by CTA or MRA and referral to cardiothoracic surgery if not already obtained. This recommendation follows  2010 ACCF/AHA/AATS/ACR/ASA/SCA/SCAI/SIR/STS/SVM Guidelines for the Diagnosis and Management of Patients With Thoracic Aortic Disease. Circulation. 2010; 121: z733-z630 2. Small penetrating atheromatous ulcer at the level of the aortic isthmus anteriorly, slightly more conspicuous than on study of 03/21/2019, without evidence of intramural hematoma or other complicating feature. 3. Cholelithiasis. 4. Stable 1.5 cm left adrenal adenoma. 5.  Aortic Atherosclerosis (ICD10-I70.0).   EKG: 09/09/23: Sinus bradycardia at 55 bpm Possible Left atrial enlargement When compared with ECG of 23-Oct-2011 23:37, Vent. rate has decreased BY 38 BPM Non-specific change in ST segment in Inferior leads Confirmed by Jeffrie Anes ([id number]) on 09/09/2023 10:42:25 AM    CV: Echo 09/01/23: IMPRESSIONS   1. Left ventricular ejection fraction, by estimation, is 65 to 70%. The  left ventricle has normal function. The left ventricle has no regional  wall motion abnormalities. There is mild concentric left ventricular  hypertrophy. Left ventricular diastolic  parameters were normal.   2. Right ventricular systolic function is normal. The right ventricular  size is normal.   3. The mitral valve is normal in structure. No evidence of mitral valve  regurgitation. No evidence of mitral stenosis.   4. The aortic valve is normal in structure. Aortic valve regurgitation is  not visualized. No aortic stenosis is present.   5. Aortic dilatation noted. There is dilatation of the ascending aorta,  measuring 47 mm.   6. The inferior vena cava is normal in size with greater than 50%  respiratory variability, suggesting right atrial pressure of 3 mmHg.    CTA Coronary with FFR 05/19/20: IMPRESSION: 1. Coronary calcium  score of 348. This was 2 percentile for age and sex matched control. 2. Normal coronary origin with right dominance. 3.  There is LAD calcified plaque proximally with 0-24% stenosis. 4. There is Ramus  mixed plaque proximally with 50-69% stenosis. Sending for FFR analysis. 5.  There is RCA calcified plaque with 0-24% stenosis. 6. Dilated ascending aorta, 43 mm, stable from prior. Continue to monitor annually. FFR IMPRESSION: 1.  There is no flow limitation in coronary arteries.    Cardiac event monitor 11/16/19: Normal sinus rhythm (AVG 77 bpm) No atrial fibrillation No pauses No PVC's or PAC's Reassuring montior.    Past Medical History:  Diagnosis Date   Aneurysm of aorta (HCC)    Arthritis    Atrophic vaginitis    Cellulitis and abscess of right leg 05/07/2018   Depression    Diverticulitis    DYSPNEA 06/04/2009   Qualifier: Diagnosis of  By: Jude MD, Harden GAILS.     Edema of both legs    She takes Lasix a couple of times per week to decrease swelling.     Fatty liver    GERD (gastroesophageal reflux disease)    History of kidney stones    Hyperlipidemia    Hypertension    Keratosis    hands and lower limbs   Lymphedema    legs   Memory difficulty    Obesity    Reflux    Shingles    Venous stasis    lower legs    Past Surgical History:  Procedure Laterality Date   ABDOMINAL HYSTERECTOMY     APPENDECTOMY  AUGMENTATION MAMMAPLASTY Bilateral    Basal and Squamous cell cancers excised     CESAREAN SECTION     X 2   CYSTOSCOPY/RETROGRADE/URETEROSCOPY/STONE EXTRACTION WITH BASKET     removal couldnt blast   EYE SURGERY     bilateral cataract with lens implants   lipoma surgery     LIPOSUCTION     knees   TOTAL KNEE ARTHROPLASTY Left 09/26/2018   Procedure: TOTAL KNEE ARTHROPLASTY;  Surgeon: Ernie Cough, MD;  Location: [facility] ORS;  Service: Orthopedics;  Laterality: Left;  70 mins   tummy tuck      MEDICATIONS:  acetaminophen  (TYLENOL ) 650 MG CR tablet   ALPRAZolam  (XANAX ) 1 MG tablet   amLODipine  (NORVASC ) 5 MG tablet   Amphetamine Sulfate 10 MG TABS   aspirin  EC 81 MG tablet   desvenlafaxine (PRISTIQ) 50 MG 24 hr tablet   dicyclomine (BENTYL) 10 MG  capsule   metoprolol  succinate (TOPROL -XL) 50 MG 24 hr tablet   pantoprazole  (PROTONIX ) 40 MG tablet   polyethylene glycol powder (GLYCOLAX /MIRALAX ) 17 GM/SCOOP powder   rosuvastatin  (CRESTOR ) 20 MG tablet   triamterene -hydrochlorothiazide  (MAXZIDE -25) 37.5-25 MG per tablet   WEGOVY 0.25 MG/0.5ML SOAJ   No current facility-administered medications for this encounter.   ASA on hold for procedure. Plans to start [facility] after surgery.    Isaiah Ruder, PA-C Surgical Short Stay/Anesthesiology [facility] Phone [phone] [facility] Phone [phone] 10/28/2023 6:55 PM

## 2023-10-28 NOTE — Anesthesia Preprocedure Evaluation (Addendum)
 Anesthesia Evaluation  Patient identified by MRN, date of birth, ID band Patient awake    Reviewed: Allergy & Precautions, H&P , NPO status , Patient's Chart, lab work & pertinent test results  Airway Mallampati: II   Neck ROM: full    Dental   Pulmonary former smoker   breath sounds clear to auscultation       Cardiovascular hypertension, + CAD and + Peripheral Vascular Disease   Rhythm:regular Rate:Normal  Echo 09/01/23: IMPRESSIONS  1. Left ventricular ejection fraction, by estimation, is 65 to 70%. The  left ventricle has normal function. The left ventricle has no regional  wall motion abnormalities. There is mild concentric left ventricular  hypertrophy. Left ventricular diastolic  parameters were normal.  2. Right ventricular systolic function is normal. The right ventricular  size is normal.  3. The mitral valve is normal in structure. No evidence of mitral valve  regurgitation. No evidence of mitral stenosis.  4. The aortic valve is normal in structure. Aortic valve regurgitation is  not visualized. No aortic stenosis is present.  5. Aortic dilatation noted. There is dilatation of the ascending aorta,  measuring 47 mm.  6. The inferior vena cava is normal in size with greater than 50%  respiratory variability, suggesting right atrial pressure of 3 mmHg.      Neuro/Psych  PSYCHIATRIC DISORDERS  Depression       GI/Hepatic ,GERD  ,,  Endo/Other    Renal/GU stones     Musculoskeletal  (+) Arthritis ,    Abdominal   Peds  Hematology   Anesthesia Other Findings   Reproductive/Obstetrics                             Anesthesia Physical Anesthesia Plan  ASA: 3  Anesthesia Plan: General   Post-op Pain Management:    Induction: Intravenous  PONV Risk Score and Plan: 3 and Ondansetron , Dexamethasone  and Treatment may vary due to age or medical condition  Airway  Management Planned: Oral ETT  Additional Equipment:   Intra-op Plan:   Post-operative Plan: Extubation in OR  Informed Consent: I have reviewed the patients History and Physical, chart, labs and discussed the procedure including the risks, benefits and alternatives for the proposed anesthesia with the patient or authorized representative who has indicated his/her understanding and acceptance.     Dental advisory given  Plan Discussed with: CRNA, Anesthesiologist and Surgeon  Anesthesia Plan Comments: (PAT note written 10/28/2023 by Allison Zelenak, PA-C.  )       Anesthesia Quick Evaluation

## 2023-11-02 ENCOUNTER — Other Ambulatory Visit: Payer: Self-pay

## 2023-11-02 ENCOUNTER — Encounter (HOSPITAL_COMMUNITY): Payer: Self-pay | Admitting: General Surgery

## 2023-11-02 ENCOUNTER — Ambulatory Visit (HOSPITAL_COMMUNITY): Payer: Self-pay | Admitting: Vascular Surgery

## 2023-11-02 ENCOUNTER — Other Ambulatory Visit: Payer: Self-pay | Admitting: Family Medicine

## 2023-11-02 ENCOUNTER — Ambulatory Visit (HOSPITAL_COMMUNITY): Payer: HMO

## 2023-11-02 ENCOUNTER — Encounter (HOSPITAL_COMMUNITY)
Admission: RE | Disposition: A | Discharge status: Home / Self Care | Payer: Self-pay | Source: Ambulatory Visit | Attending: General Surgery

## 2023-11-02 ENCOUNTER — Ambulatory Visit (HOSPITAL_COMMUNITY)
Admission: RE | Admit: 2023-11-02 | Discharge: 2023-11-02 | Disposition: A | Discharge status: Home / Self Care | Payer: HMO | Source: Ambulatory Visit | Attending: General Surgery | Admitting: General Surgery

## 2023-11-02 ENCOUNTER — Ambulatory Visit (HOSPITAL_BASED_OUTPATIENT_CLINIC_OR_DEPARTMENT_OTHER): Payer: HMO | Admitting: Anesthesiology

## 2023-11-02 DIAGNOSIS — Z87891 Personal history of nicotine dependence: Secondary | ICD-10-CM | POA: Diagnosis not present

## 2023-11-02 DIAGNOSIS — M1990 Unspecified osteoarthritis, unspecified site: Secondary | ICD-10-CM | POA: Diagnosis not present

## 2023-11-02 DIAGNOSIS — K8010 Calculus of gallbladder with chronic cholecystitis without obstruction: Secondary | ICD-10-CM | POA: Insufficient documentation

## 2023-11-02 DIAGNOSIS — K760 Fatty (change of) liver, not elsewhere classified: Secondary | ICD-10-CM | POA: Insufficient documentation

## 2023-11-02 DIAGNOSIS — I77819 Aortic ectasia, unspecified site: Secondary | ICD-10-CM | POA: Insufficient documentation

## 2023-11-02 DIAGNOSIS — I10 Essential (primary) hypertension: Secondary | ICD-10-CM | POA: Insufficient documentation

## 2023-11-02 DIAGNOSIS — K7689 Other specified diseases of liver: Secondary | ICD-10-CM

## 2023-11-02 DIAGNOSIS — I2510 Atherosclerotic heart disease of native coronary artery without angina pectoris: Secondary | ICD-10-CM | POA: Insufficient documentation

## 2023-11-02 DIAGNOSIS — I739 Peripheral vascular disease, unspecified: Secondary | ICD-10-CM | POA: Insufficient documentation

## 2023-11-02 DIAGNOSIS — K8020 Calculus of gallbladder without cholecystitis without obstruction: Secondary | ICD-10-CM | POA: Diagnosis not present

## 2023-11-02 DIAGNOSIS — K219 Gastro-esophageal reflux disease without esophagitis: Secondary | ICD-10-CM | POA: Insufficient documentation

## 2023-11-02 DIAGNOSIS — F32A Depression, unspecified: Secondary | ICD-10-CM | POA: Diagnosis not present

## 2023-11-02 HISTORY — PX: CHOLECYSTECTOMY: SHX55

## 2023-11-02 SURGERY — LAPAROSCOPIC CHOLECYSTECTOMY WITH INTRAOPERATIVE CHOLANGIOGRAM
Anesthesia: General | Site: Abdomen

## 2023-11-02 MED ORDER — SODIUM CHLORIDE 0.9 % IR SOLN
Status: DC | PRN
  Administered 2023-11-02: 1000 mL

## 2023-11-02 MED ORDER — FENTANYL CITRATE (PF) 100 MCG/2ML IJ SOLN
25.0000 ug | INTRAMUSCULAR | Status: DC | PRN
  Administered 2023-11-02 (×2): 25 ug via INTRAVENOUS

## 2023-11-02 MED ORDER — ORAL CARE MOUTH RINSE: 15.0000 mL | Freq: Once | OROMUCOSAL | Status: AC

## 2023-11-02 MED ORDER — ROCURONIUM BROMIDE 10 MG/ML (PF) SYRINGE
PREFILLED_SYRINGE | INTRAVENOUS | Status: AC
  Filled 2023-11-02: qty 10

## 2023-11-02 MED ORDER — ONDANSETRON HCL 4 MG/2ML IJ SOLN
INTRAMUSCULAR | Status: DC | PRN
  Administered 2023-11-02: 4 mg via INTRAVENOUS

## 2023-11-02 MED ORDER — OXYCODONE HCL 5 MG/5ML PO SOLN
ORAL | Status: AC
  Filled 2023-11-02: qty 5

## 2023-11-02 MED ORDER — CHLORHEXIDINE GLUCONATE CLOTH 2 % EX PADS: 6.0000 | MEDICATED_PAD | Freq: Once | CUTANEOUS | Status: DC

## 2023-11-02 MED ORDER — SODIUM CHLORIDE 0.9 % IV SOLN: INTRAVENOUS | Status: DC

## 2023-11-02 MED ORDER — CHLORHEXIDINE GLUCONATE 0.12 % MT SOLN
15.0000 mL | Freq: Once | OROMUCOSAL | Status: AC
Start: 2023-11-02 — End: 2023-11-02
  Administered 2023-11-02: 15 mL via OROMUCOSAL
  Filled 2023-11-02: qty 15

## 2023-11-02 MED ORDER — OXYCODONE HCL 5 MG PO TABS: 5.0000 mg | ORAL_TABLET | Freq: Four times a day (QID) | ORAL | 0 refills | Status: DC | PRN

## 2023-11-02 MED ORDER — LACTATED RINGERS IV SOLN: INTRAVENOUS | Status: DC

## 2023-11-02 MED ORDER — ONDANSETRON HCL 4 MG/2ML IJ SOLN
INTRAMUSCULAR | Status: AC
  Filled 2023-11-02: qty 2

## 2023-11-02 MED ORDER — BUPIVACAINE-EPINEPHRINE 0.25% -1:200000 IJ SOLN
INTRAMUSCULAR | Status: DC | PRN
  Administered 2023-11-02: 22 mL

## 2023-11-02 MED ORDER — PROPOFOL 10 MG/ML IV BOLUS
INTRAVENOUS | Status: DC | PRN
  Administered 2023-11-02: 100 mg via INTRAVENOUS

## 2023-11-02 MED ORDER — LIDOCAINE 2% (20 MG/ML) 5 ML SYRINGE
INTRAMUSCULAR | Status: AC
  Filled 2023-11-02: qty 5

## 2023-11-02 MED ORDER — GABAPENTIN 100 MG PO CAPS
100.0000 mg | ORAL_CAPSULE | ORAL | Status: AC
  Administered 2023-11-02: 100 mg via ORAL
  Filled 2023-11-02: qty 1

## 2023-11-02 MED ORDER — LIDOCAINE 2% (20 MG/ML) 5 ML SYRINGE
INTRAMUSCULAR | Status: DC | PRN
  Administered 2023-11-02: 60 mg via INTRAVENOUS

## 2023-11-02 MED ORDER — SUGAMMADEX SODIUM 200 MG/2ML IV SOLN
INTRAVENOUS | Status: DC | PRN
  Administered 2023-11-02: 100 mg via INTRAVENOUS
  Administered 2023-11-02: 200 mg via INTRAVENOUS

## 2023-11-02 MED ORDER — BUPIVACAINE-EPINEPHRINE (PF) 0.25% -1:200000 IJ SOLN
INTRAMUSCULAR | Status: AC
  Filled 2023-11-02: qty 30

## 2023-11-02 MED ORDER — FENTANYL CITRATE (PF) 100 MCG/2ML IJ SOLN
INTRAMUSCULAR | Status: AC
  Filled 2023-11-02: qty 2

## 2023-11-02 MED ORDER — EPHEDRINE 5 MG/ML INJ
INTRAVENOUS | Status: AC
  Filled 2023-11-02: qty 5

## 2023-11-02 MED ORDER — PHENYLEPHRINE 80 MCG/ML (10ML) SYRINGE FOR IV PUSH (FOR BLOOD PRESSURE SUPPORT)
PREFILLED_SYRINGE | INTRAVENOUS | Status: DC | PRN
  Administered 2023-11-02: 160 ug via INTRAVENOUS

## 2023-11-02 MED ORDER — EPHEDRINE SULFATE-NACL 50-0.9 MG/10ML-% IV SOSY
PREFILLED_SYRINGE | INTRAVENOUS | Status: DC | PRN
  Administered 2023-11-02: 5 mg via INTRAVENOUS

## 2023-11-02 MED ORDER — CEFAZOLIN SODIUM-DEXTROSE 2-4 GM/100ML-% IV SOLN
2.0000 g | INTRAVENOUS | Status: AC
  Administered 2023-11-02: 2 g via INTRAVENOUS
  Filled 2023-11-02: qty 100

## 2023-11-02 MED ORDER — OXYCODONE HCL 5 MG PO TABS: 5.0000 mg | ORAL_TABLET | Freq: Once | ORAL | Status: AC | PRN

## 2023-11-02 MED ORDER — PROPOFOL 10 MG/ML IV BOLUS
INTRAVENOUS | Status: AC
  Filled 2023-11-02: qty 20

## 2023-11-02 MED ORDER — PHENYLEPHRINE 80 MCG/ML (10ML) SYRINGE FOR IV PUSH (FOR BLOOD PRESSURE SUPPORT)
PREFILLED_SYRINGE | INTRAVENOUS | Status: AC
  Filled 2023-11-02: qty 10

## 2023-11-02 MED ORDER — MIDAZOLAM HCL 2 MG/2ML IJ SOLN
INTRAMUSCULAR | Status: AC
  Filled 2023-11-02: qty 2

## 2023-11-02 MED ORDER — ROCURONIUM BROMIDE 10 MG/ML (PF) SYRINGE
PREFILLED_SYRINGE | INTRAVENOUS | Status: DC | PRN
  Administered 2023-11-02: 50 mg via INTRAVENOUS

## 2023-11-02 MED ORDER — ACETAMINOPHEN 500 MG PO TABS
1000.0000 mg | ORAL_TABLET | ORAL | Status: AC
  Administered 2023-11-02: 1000 mg via ORAL
  Filled 2023-11-02: qty 2

## 2023-11-02 MED ORDER — SODIUM CHLORIDE 0.9 % IV SOLN
INTRAVENOUS | Status: DC | PRN
  Administered 2023-11-02: 100 mL

## 2023-11-02 MED ORDER — DEXAMETHASONE SODIUM PHOSPHATE 10 MG/ML IJ SOLN
INTRAMUSCULAR | Status: AC
  Filled 2023-11-02: qty 1

## 2023-11-02 MED ORDER — DEXAMETHASONE SODIUM PHOSPHATE 10 MG/ML IJ SOLN
INTRAMUSCULAR | Status: DC | PRN
  Administered 2023-11-02: 10 mg via INTRAVENOUS

## 2023-11-02 MED ORDER — OXYCODONE HCL 5 MG/5ML PO SOLN
5.0000 mg | Freq: Once | ORAL | Status: AC | PRN
Start: 2023-11-02 — End: 2023-11-02
  Administered 2023-11-02: 5 mg via ORAL

## 2023-11-02 MED ORDER — ONDANSETRON HCL 4 MG/2ML IJ SOLN: 4.0000 mg | Freq: Four times a day (QID) | INTRAMUSCULAR | Status: DC | PRN

## 2023-11-02 MED ORDER — 0.9 % SODIUM CHLORIDE (POUR BTL) OPTIME
TOPICAL | Status: DC | PRN
  Administered 2023-11-02: 1000 mL

## 2023-11-02 MED ORDER — FENTANYL CITRATE (PF) 250 MCG/5ML IJ SOLN
INTRAMUSCULAR | Status: AC
  Filled 2023-11-02: qty 5

## 2023-11-02 MED ORDER — FENTANYL CITRATE (PF) 250 MCG/5ML IJ SOLN
INTRAMUSCULAR | Status: DC | PRN
  Administered 2023-11-02: 25 ug via INTRAVENOUS
  Administered 2023-11-02 (×3): 50 ug via INTRAVENOUS
  Administered 2023-11-02: 25 ug via INTRAVENOUS

## 2023-11-02 SURGICAL SUPPLY — 35 items
APPLIER CLIP 5 13 M/L LIGAMAX5 (MISCELLANEOUS) ×1
BAG COUNTER SPONGE SURGICOUNT (BAG) ×2 IMPLANT
CANISTER SUCT 3000ML PPV (MISCELLANEOUS) ×2 IMPLANT
CATH REDDICK CHOLANGI 4FR 50CM (CATHETERS) ×2 IMPLANT
CHLORAPREP W/TINT 26 (MISCELLANEOUS) ×2 IMPLANT
CLIP APPLIE 5 13 M/L LIGAMAX5 (MISCELLANEOUS) ×2 IMPLANT
COVER MAYO STAND STRL (DRAPES) ×2 IMPLANT
COVER SURGICAL LIGHT HANDLE (MISCELLANEOUS) ×2 IMPLANT
DERMABOND ADVANCED .7 DNX12 (GAUZE/BANDAGES/DRESSINGS) ×2 IMPLANT
DRAPE C-ARM 42X120 X-RAY (DRAPES) ×2 IMPLANT
ELECT REM PT RETURN 9FT ADLT (ELECTROSURGICAL) ×1
ELECTRODE REM PT RTRN 9FT ADLT (ELECTROSURGICAL) ×2 IMPLANT
GLOVE BIO SURGEON STRL SZ7.5 (GLOVE) ×2 IMPLANT
GOWN STRL REUS W/ TWL LRG LVL3 (GOWN DISPOSABLE) ×6 IMPLANT
IRRIG SUCT STRYKERFLOW 2 WTIP (MISCELLANEOUS) ×1
IRRIGATION SUCT STRKRFLW 2 WTP (MISCELLANEOUS) ×2 IMPLANT
IV CATH 14GX2 1/4 (CATHETERS) ×2 IMPLANT
KIT BASIN OR (CUSTOM PROCEDURE TRAY) ×2 IMPLANT
KIT TURNOVER KIT B (KITS) ×2 IMPLANT
NS IRRIG 1000ML POUR BTL (IV SOLUTION) ×2 IMPLANT
PAD ARMBOARD 7.5X6 YLW CONV (MISCELLANEOUS) ×2 IMPLANT
SCISSORS LAP 5X35 DISP (ENDOMECHANICALS) ×2 IMPLANT
SET TUBE SMOKE EVAC HIGH FLOW (TUBING) ×2 IMPLANT
SLEEVE Z-THREAD 5X100MM (TROCAR) ×4 IMPLANT
SPECIMEN JAR SMALL (MISCELLANEOUS) ×2 IMPLANT
SUT MNCRL AB 4-0 PS2 18 (SUTURE) ×2 IMPLANT
SYS BAG RETRIEVAL 10MM (BASKET) ×1
SYSTEM BAG RETRIEVAL 10MM (BASKET) ×2 IMPLANT
TOWEL GREEN STERILE (TOWEL DISPOSABLE) ×2 IMPLANT
TOWEL GREEN STERILE FF (TOWEL DISPOSABLE) ×2 IMPLANT
TRAY LAPAROSCOPIC MC (CUSTOM PROCEDURE TRAY) ×2 IMPLANT
TROCAR BALLN 12MMX100 BLUNT (TROCAR) ×2 IMPLANT
TROCAR Z-THREAD OPTICAL 5X100M (TROCAR) ×2 IMPLANT
WARMER LAPAROSCOPE (MISCELLANEOUS) ×2 IMPLANT
WATER STERILE IRR 1000ML POUR (IV SOLUTION) ×2 IMPLANT

## 2023-11-02 NOTE — Transfer of Care (Signed)
 Immediate Anesthesia Transfer of Care Note  Patient: Patricia [name]  Procedure(s) Performed: LAPAROSCOPIC CHOLECYSTECTOMY (Abdomen)  Patient Location: PACU  Anesthesia Type:General  Level of Consciousness: awake, alert , and oriented  Airway & Oxygen Therapy: Patient connected to face mask oxygen  Post-op Assessment: Report given to RN and Post -op Vital signs reviewed and stable  Post vital signs: Reviewed and stable  Last Vitals:  Vitals Value Taken Time  BP 96/59 11/02/23 1327  Temp    Pulse 69 11/02/23 1328  Resp 18 11/02/23 1328  SpO2 99 % 11/02/23 1328  Vitals shown include unfiled device data.  Last Pain:  Vitals:   11/02/23 1116  TempSrc:   PainSc: 0-No pain         Complications: There were no known notable events for this encounter.

## 2023-11-02 NOTE — H&P (Signed)
 REFERRING PHYSICIAN: Jonn Nett PROVIDER: Arlester Bence, MD MRN: [id number] DOB: November 27, 1950 Subjective   Chief Complaint: New Consultation (Biliary calculus of other site w/o obstruction)  History of Present Illness: Patricia [name] is a 73 y.o. female who is seen today as an office consultation for evaluation of New Consultation (Biliary calculus of other site w/o obstruction)  We are asked to see the patient in consultation by Dr. Jonn Nett to evaluate her for gallstones. The patient is a 73 year old white female who apparently has had some fatty liver noticed on previous scans. She recently underwent a follow-up ultrasound to look at her liver. At that time she was found to have stones in her gallbladder and a small benign-appearing cyst. There was some mild thickening of the gallbladder wall. She denies any abdominal pain. She denies any nausea or vomiting. She denies any fevers or chills. She is otherwise in good health and does not smoke.  Review of Systems: A complete review of systems was obtained from the patient. I have reviewed this information and discussed as appropriate with the patient. See HPI as well for other ROS.  ROS   Medical History: History reviewed. No pertinent past medical history.  Patient Active Problem List  Diagnosis  Calculus of gallbladder with chronic cholecystitis without obstruction   History reviewed. No pertinent surgical history.   Allergies  Allergen Reactions  Ciprofloxacin  Unknown  Clindamycin Other (See Comments)  STOMACH PAIN  Morphine Itching  Tetracycline Other (See Comments)  STOMACH PAIN   Current Outpatient Medications on File Prior to Visit  Medication Sig Dispense Refill  ALPRAZolam  (XANAX ) 1 MG tablet TAKE ONE-HALF TO ONE TABLET BY MOUTH ONE TIME DAILY AS NEEDED WHEN FLYING  amLODIPine  (NORVASC ) 5 MG tablet Take 5 mg by mouth  desvenlafaxine succinate (PRISTIQ) 50 MG ER tablet Take 50 mg by oral route.   dextroamphetamine-amphetamine (ADDERALL) 10 mg tablet Take 10 mg by mouth daily with breakfast  loratadine  (CLARITIN ) 10 mg tablet Take 1 tablet by mouth once daily  metoprolol  succinate (TOPROL -XL) 50 MG XL tablet  pantoprazole  (PROTONIX ) 40 MG DR tablet take 1 tablet by mouth 1 hour before eating twice a day until next office visit  polyethylene glycol (MIRALAX ) packet Take 17 g by mouth once daily  rosuvastatin  (CRESTOR ) 10 MG tablet  triamterene -hydroCHLOROthiazide  (MAXZIDE -25) 37.5-25 mg tablet Take 1 [tbl] by oral route.   No current facility-administered medications on file prior to visit.   History reviewed. No pertinent family history.   Social History   Tobacco Use  Smoking Status Former  Types: Cigarettes  Smokeless Tobacco Never    Social History   Socioeconomic History  Marital status: Married  Tobacco Use  Smoking status: Former  Types: Cigarettes  Smokeless tobacco: Never  Vaping Use  Vaping status: Unknown   Objective:   Vitals:  BP: 130/80  Pulse: 85  Temp: 36.6 C (97.9 F)  SpO2: 96%  Weight: 78.6 kg (173 lb 3.2 oz)  Height: 163.8 cm (5' 4.5")  PainSc: 0-No pain   Body mass index is 29.27 kg/m.  Physical Exam Vitals reviewed.  Constitutional:  General: She is not in acute distress. Appearance: Normal appearance.  HENT:  Head: Normocephalic and atraumatic.  Right Ear: External ear normal.  Left Ear: External ear normal.  Nose: Nose normal.  Mouth/Throat:  Mouth: Mucous membranes are moist.  Pharynx: Oropharynx is clear.  Eyes:  General: No scleral icterus. Extraocular Movements: Extraocular movements intact.  Conjunctiva/sclera: Conjunctivae normal.  Pupils: Pupils are  equal, round, and reactive to light.  Cardiovascular:  Rate and Rhythm: Normal rate and regular rhythm.  Pulses: Normal pulses.  Heart sounds: Normal heart sounds.  Pulmonary:  Effort: Pulmonary effort is normal. No respiratory distress.  Breath sounds: Normal  breath sounds.  Abdominal:  General: Bowel sounds are normal.  Palpations: Abdomen is soft. There is no mass.  Tenderness: There is no abdominal tenderness.  Musculoskeletal:  General: No swelling, tenderness or deformity. Normal range of motion.  Cervical back: Normal range of motion and neck supple.  Skin: General: Skin is warm and dry.  Coloration: Skin is not jaundiced.  Neurological:  General: No focal deficit present.  Mental Status: She is alert and oriented to person, place, and time.  Psychiatric:  Mood and Affect: Mood normal.  Behavior: Behavior normal.     Labs, Imaging and Diagnostic Testing:  Assessment and Plan:   Diagnoses and all orders for this visit:  Calculus of gallbladder with chronic cholecystitis without obstruction   The patient appears to have gallstones that are relatively asymptomatic. The presence of the stone certainly can cause abdominal pain with nausea and vomiting and there is a risk of pancreatitis also associated with that if the stones get into the common bile duct. I have discussed with her in detail the risks and benefits of the operation to remove the gallbladder as well as some of the technical aspects including the risk of common duct injury and she understands. Since she is asymptomatic she would like to wait and think about this. Certainly if she develops any abdominal pain she will contact us  right away. She also does a lot of international travel and is considering having her gallbladder removed to make sure that she does not get sick from a gallbladder attack while she is out of the country. She will let us  know how she would like to proceed and we will move forward with that.

## 2023-11-02 NOTE — Anesthesia Procedure Notes (Signed)
 Procedure Name: Intubation Date/Time: 11/02/2023 12:19 PM  Performed by: Jamas Maywood, CRNAPre-anesthesia Checklist: Patient identified, Emergency Drugs available, Suction available and Patient being monitored Patient Re-evaluated:Patient Re-evaluated prior to induction Oxygen Delivery Method: Circle system utilized Preoxygenation: Pre-oxygenation with 100% oxygen Induction Type: IV induction Ventilation: Mask ventilation without difficulty Laryngoscope Size: Mac and 4 Grade View: Grade I Tube type: Oral Tube size: 7.0 mm Number of attempts: 1 Airway Equipment and Method: Stylet and Oral airway Placement Confirmation: ETT inserted through vocal cords under direct vision, positive ETCO2 and breath sounds checked- equal and bilateral Secured at: 22 cm Tube secured with: Tape Dental Injury: Teeth and Oropharynx as per pre-operative assessment

## 2023-11-02 NOTE — Interval H&P Note (Signed)
 History and Physical Interval Note:  11/02/2023 11:01 AM  Patricia [name]  has presented today for surgery, with the diagnosis of Gallstones.  The various methods of treatment have been discussed with the patient and family. After consideration of risks, benefits and other options for treatment, the patient has consented to  Procedure(s) with comments: LAPAROSCOPIC CHOLECYSTECTOMY WITH INTRAOPERATIVE CHOLANGIOGRAM (N/A) - ASSIST: RNFA as a surgical intervention.  The patient's history has been reviewed, patient examined, no change in status, stable for surgery.  I have reviewed the patient's chart and labs.  Questions were answered to the patient's satisfaction.     Lillette Reid III

## 2023-11-02 NOTE — Op Note (Signed)
 11/02/2023  1:08 PM  PATIENT:  Patricia [name]  73 y.o. female  PRE-OPERATIVE DIAGNOSIS:  Gallstones  POST-OPERATIVE DIAGNOSIS:  Gallstones  PROCEDURE:  Procedure(s) with comments: LAPAROSCOPIC CHOLECYSTECTOMY   SURGEON:  Surgeons and Role:    * Caralyn Chandler, MD - Primary  PHYSICIAN ASSISTANT:   ASSISTANTS: Jacquelynne Matters, RNFA   ANESTHESIA:   local and general  EBL:  25 mL   BLOOD ADMINISTERED:none  DRAINS: none   LOCAL MEDICATIONS USED:  MARCAINE      SPECIMEN:  Source of Specimen:  gallbladder  DISPOSITION OF SPECIMEN:  PATHOLOGY  COUNTS:  YES  TOURNIQUET:  * No tourniquets in log *  DICTATION: .Dragon Dictation    Procedure: After informed consent was obtained the patient was brought to the operating room and placed in the supine position on the operating room table. After adequate induction of general anesthesia the patient's abdomen was prepped with ChloraPrep allowed to dry and draped in usual sterile manner. An appropriate timeout was performed. The area above the umbilicus was infiltrated with quarter percent  Marcaine . A small incision was made with a 15 blade knife. The incision was carried down through the subcutaneous tissue bluntly with a hemostat and [employer] retractors. The linea alba was identified. The linea alba was incised with a 15 blade knife and each side was grasped with Coker clamps. The preperitoneal space was then probed with a hemostat until the peritoneum was opened and access was gained to the abdominal cavity. A 0 Vicryl pursestring stitch was placed in the fascia surrounding the opening. A Hassan cannula was then placed through the opening and anchored in place with the previously placed Vicryl purse string stitch. The abdomen was insufflated with carbon dioxide without difficulty. A laparoscope was inserted through the [facility] cannula in the right upper quadrant was inspected. Next the epigastric region was infiltrated with % Marcaine .  A small incision was made with a 15 blade knife. A 5 mm port was placed bluntly through this incision into the abdominal cavity under direct vision. Next 2 sites were chosen laterally on the right side of the abdomen for placement of 5 mm ports. Each of these areas was infiltrated with quarter percent Marcaine . Small stab incisions were made with a 15 blade knife. 5 mm ports were then placed bluntly through these incisions into the abdominal cavity under direct vision without difficulty. A blunt grasper was placed through the lateralmost 5 mm port and used to grasp the dome of the gallbladder and elevate it anteriorly and superiorly. Another blunt grasper was placed through the other 5 mm port and used to retract the body and neck of the gallbladder. A dissector was placed through the epigastric port and using the electrocautery the peritoneal reflection at the gallbladder neck was opened. Blunt dissection was then carried out in this area until the gallbladder neck-cystic duct junction was readily identified and a good critical window was created. A single clip was placed on the gallbladder neck. A small  ductotomy was made just below the clip with laparoscopic scissors. A 14-gauge Angiocath was then placed through the anterior abdominal wall under direct vision. A Reddick cholangiogram catheter was then placed through the Angiocath and flushed.  The catheter would not feed into the cystic duct and the cystic duct was noted to be quite small.  I could see her anatomy very well.  Because of this I elected not to get the cholangiogram.  3 clips were placed proximally on the cystic  duct and the duct was divided between the 2 sets of clips. Posterior to this the cystic artery was identified and again dissected bluntly in a circumferential manner until a good window  was created. 2 clips were placed proximally and one distally on the artery and the artery was divided between the 2 sets of clips. Next a laparoscopic hook  cautery device was used to separate the gallbladder from the liver bed. Prior to completely detaching the gallbladder from the liver bed the liver bed was inspected and several small bleeding points were coagulated with the electrocautery until the area was completely hemostatic. The gallbladder was then detached the rest of it from the liver bed without difficulty. A laparoscopic bag was inserted through the hassan port. The laparoscope was moved to the epigastric port. The gallbladder was placed within the bag and the bag was sealed.  The bag with the gallbladder was then removed with the [facility] cannula through the infraumbilical port without difficulty. The fascial defect was then closed with the previously placed Vicryl pursestring stitch as well as with another figure-of-eight 0 Vicryl stitch. The liver bed was inspected again and found to be hemostatic. The abdomen was irrigated with copious amounts of saline until the effluent was clear. The ports were then removed under direct vision without difficulty and were found to be hemostatic. The gas was allowed to escape. No other abnormalities were noted on general inspection of the abdomen. The skin incisions were all closed with interrupted 4-0 Monocryl subcuticular stitches. Dermabond dressings were applied. The patient tolerated the procedure well. At the end of the case all needle sponge and instrument counts were correct. The patient was then awakened and taken to recovery in stable condition  PLAN OF CARE: Discharge to home after PACU  PATIENT DISPOSITION:  PACU - hemodynamically stable.   Delay start of Pharmacological VTE agent (>24hrs) due to surgical blood loss or risk of bleeding: not applicable

## 2023-11-03 ENCOUNTER — Encounter (HOSPITAL_COMMUNITY): Payer: Self-pay | Admitting: General Surgery

## 2023-11-03 LAB — SURGICAL PATHOLOGY

## 2023-11-03 NOTE — Anesthesia Postprocedure Evaluation (Signed)
Anesthesia Post Note  Patient: Patricia [name]  Procedure(s) Performed: LAPAROSCOPIC CHOLECYSTECTOMY (Abdomen)     Patient location during evaluation: PACU Anesthesia Type: General Level of consciousness: awake and alert Pain management: pain level controlled Vital Signs Assessment: post-procedure vital signs reviewed and stable Respiratory status: spontaneous breathing, nonlabored ventilation, respiratory function stable and patient connected to nasal cannula oxygen Cardiovascular status: blood pressure returned to baseline and stable Postop Assessment: no apparent nausea or vomiting Anesthetic complications: no   There were no known notable events for this encounter.  Last Vitals:  Vitals:   11/02/23 1445 11/02/23 1459  BP: (!) 95/58 101/61  Pulse: 61 62  Resp: 15 16  Temp:    SpO2: 93% 92%    Last Pain:  Vitals:   11/02/23 1430  TempSrc:   PainSc: Asleep                 Damarco Keysor S

## 2023-11-10 DIAGNOSIS — R82998 Other abnormal findings in urine: Secondary | ICD-10-CM | POA: Diagnosis not present

## 2023-11-28 ENCOUNTER — Ambulatory Visit
Admission: RE | Admit: 2023-11-28 | Discharge: 2023-11-28 | Disposition: A | Discharge status: Home / Self Care | Payer: HMO | Source: Ambulatory Visit | Attending: Family Medicine | Admitting: Family Medicine

## 2023-11-28 DIAGNOSIS — K7689 Other specified diseases of liver: Secondary | ICD-10-CM

## 2023-12-14 ENCOUNTER — Other Ambulatory Visit: Payer: Self-pay | Admitting: Cardiology

## 2023-12-22 ENCOUNTER — Other Ambulatory Visit: Payer: Self-pay | Admitting: Cardiology

## 2023-12-26 ENCOUNTER — Other Ambulatory Visit: Payer: Self-pay | Admitting: Emergency Medicine

## 2023-12-26 DIAGNOSIS — E782 Mixed hyperlipidemia: Secondary | ICD-10-CM | POA: Diagnosis not present

## 2023-12-26 DIAGNOSIS — Z6829 Body mass index (BMI) 29.0-29.9, adult: Secondary | ICD-10-CM | POA: Diagnosis not present

## 2023-12-26 DIAGNOSIS — I10 Essential (primary) hypertension: Secondary | ICD-10-CM | POA: Diagnosis not present

## 2023-12-26 DIAGNOSIS — Z8639 Personal history of other endocrine, nutritional and metabolic disease: Secondary | ICD-10-CM | POA: Diagnosis not present

## 2023-12-26 DIAGNOSIS — R609 Edema, unspecified: Secondary | ICD-10-CM | POA: Diagnosis not present

## 2023-12-26 DIAGNOSIS — K130 Diseases of lips: Secondary | ICD-10-CM | POA: Diagnosis not present

## 2023-12-26 DIAGNOSIS — K760 Fatty (change of) liver, not elsewhere classified: Secondary | ICD-10-CM | POA: Diagnosis not present

## 2023-12-26 DIAGNOSIS — E663 Overweight: Secondary | ICD-10-CM | POA: Diagnosis not present

## 2024-01-23 DIAGNOSIS — E782 Mixed hyperlipidemia: Secondary | ICD-10-CM | POA: Diagnosis not present

## 2024-01-23 DIAGNOSIS — L918 Other hypertrophic disorders of the skin: Secondary | ICD-10-CM | POA: Diagnosis not present

## 2024-01-23 DIAGNOSIS — Z85828 Personal history of other malignant neoplasm of skin: Secondary | ICD-10-CM | POA: Diagnosis not present

## 2024-01-23 DIAGNOSIS — Z6829 Body mass index (BMI) 29.0-29.9, adult: Secondary | ICD-10-CM | POA: Diagnosis not present

## 2024-01-23 DIAGNOSIS — K760 Fatty (change of) liver, not elsewhere classified: Secondary | ICD-10-CM | POA: Diagnosis not present

## 2024-01-23 DIAGNOSIS — D1801 Hemangioma of skin and subcutaneous tissue: Secondary | ICD-10-CM | POA: Diagnosis not present

## 2024-01-23 DIAGNOSIS — L820 Inflamed seborrheic keratosis: Secondary | ICD-10-CM | POA: Diagnosis not present

## 2024-01-23 DIAGNOSIS — L570 Actinic keratosis: Secondary | ICD-10-CM | POA: Diagnosis not present

## 2024-01-23 DIAGNOSIS — L718 Other rosacea: Secondary | ICD-10-CM | POA: Diagnosis not present

## 2024-01-23 DIAGNOSIS — I10 Essential (primary) hypertension: Secondary | ICD-10-CM | POA: Diagnosis not present

## 2024-01-23 DIAGNOSIS — Z8639 Personal history of other endocrine, nutritional and metabolic disease: Secondary | ICD-10-CM | POA: Diagnosis not present

## 2024-01-23 DIAGNOSIS — L814 Other melanin hyperpigmentation: Secondary | ICD-10-CM | POA: Diagnosis not present

## 2024-01-23 DIAGNOSIS — L821 Other seborrheic keratosis: Secondary | ICD-10-CM | POA: Diagnosis not present

## 2024-01-23 DIAGNOSIS — E663 Overweight: Secondary | ICD-10-CM | POA: Diagnosis not present

## 2024-01-23 DIAGNOSIS — I788 Other diseases of capillaries: Secondary | ICD-10-CM | POA: Diagnosis not present

## 2024-01-30 ENCOUNTER — Telehealth: Payer: Self-pay | Admitting: *Deleted

## 2024-01-30 NOTE — Telephone Encounter (Signed)
   Pre-operative Risk Assessment    Patient Name: Patricia [name]  DOB: 1951/01/19 MRN: [id number]   Date of last office visit: 09/09/23 DR. SKAINS Date of next office visit: NONE   Request for Surgical Clearance    Procedure:   RIGHT TOTAL KNEE ARTHROPLASTY  Date of Surgery:  Clearance 04/26/24                                Surgeon:  DR. MATTHEW OLIN Surgeon's Group or Practice Name:  [facility] Phone number:  [phone] Amanda Jungling Fax number:  [phone]   Type of Clearance Requested:   - Medical  - Pharmacy:  Hold Aspirin     Type of Anesthesia:  Spinal   Additional requests/questions:    [name]   01/30/2024, 5:42 PM

## 2024-01-31 NOTE — Telephone Encounter (Signed)
   Name: Patricia [name]  DOB: [birth year]  MRN: [id number]  Primary Cardiologist: Dorothye Gathers, MD   Preoperative team, please contact this patient and set up a phone call appointment for further preoperative risk assessment. Please obtain consent and complete medication review. Thank you for your help.  I confirm that guidance regarding antiplatelet and oral anticoagulation therapy has been completed and, if necessary, noted below.  Per office protocol, she may hold aspirin for 5-7 days prior to procedure. Please resume aspirin as soon as possible postprocedure, at the discretion of the surgeon.    I also confirmed the patient resides in the state of [place] . As per [facility] Medical Board telemedicine laws, the patient must reside in the state in which the provider is licensed.   Ava Boatman, NP 01/31/2024, 11:15 AM [facility]

## 2024-01-31 NOTE — Telephone Encounter (Signed)
 Lvm to call back clinic for an over the phone office visit

## 2024-02-01 NOTE — Telephone Encounter (Signed)
 2nd attempt to schedule preop tele visit, LVMFCB. AC

## 2024-02-03 NOTE — Telephone Encounter (Signed)
 Called patient no answer, 3rd attempt to schedule preop tele visit, LVMFCB

## 2024-02-06 NOTE — Telephone Encounter (Signed)
Pt is returning call to schedule appt.

## 2024-02-07 NOTE — Telephone Encounter (Signed)
I left a message for the patient to call our office to schedule a tele visit for pre-op 

## 2024-02-10 ENCOUNTER — Telehealth: Payer: Self-pay

## 2024-02-10 NOTE — Telephone Encounter (Signed)
 Preop televisit scheduled, med rec and consent done

## 2024-02-10 NOTE — Telephone Encounter (Signed)
  Patient Consent for Virtual Visit        Patricia [name] has provided verbal consent on 02/10/2024 for a virtual visit (video or telephone).   CONSENT FOR VIRTUAL VISIT FOR:  Patricia [name]  By participating in this virtual visit I agree to the following:  I hereby voluntarily request, consent and authorize [facility] and its employed or contracted physicians, physician assistants, nurse practitioners or other licensed health care professionals (the Practitioner), to provide me with telemedicine health care services (the "Services") as deemed necessary by the treating Practitioner. I acknowledge and consent to receive the Services by the Practitioner via telemedicine. I understand that the telemedicine visit will involve communicating with the Practitioner through live audiovisual communication technology and the disclosure of certain medical information by electronic transmission. I acknowledge that I have been given the opportunity to request an in-person assessment or other available alternative prior to the telemedicine visit and am voluntarily participating in the telemedicine visit.  I understand that I have the right to withhold or withdraw my consent to the use of telemedicine in the course of my care at any time, without affecting my right to future care or treatment, and that the Practitioner or I may terminate the telemedicine visit at any time. I understand that I have the right to inspect all information obtained and/or recorded in the course of the telemedicine visit and may receive copies of available information for a reasonable fee.  I understand that some of the potential risks of receiving the Services via telemedicine include:  Delay or interruption in medical evaluation due to technological equipment failure or disruption; Information transmitted may not be sufficient (e.g. poor resolution of images) to allow for appropriate medical decision making by the  Practitioner; and/or  In rare instances, security protocols could fail, causing a breach of personal health information.  Furthermore, I acknowledge that it is my responsibility to provide information about my medical history, conditions and care that is complete and accurate to the best of my ability. I acknowledge that Practitioner's advice, recommendations, and/or decision may be based on factors not within their control, such as incomplete or inaccurate data provided by me or distortions of diagnostic images or specimens that may result from electronic transmissions. I understand that the practice of medicine is not an exact science and that Practitioner makes no warranties or guarantees regarding treatment outcomes. I acknowledge that a copy of this consent can be made available to me via my patient portal [facility] MyChart), or I can request a printed copy by calling the office of [facility].    I understand that my insurance will be billed for this visit.   I have read or had this consent read to me. I understand the contents of this consent, which adequately explains the benefits and risks of the Services being provided via telemedicine.  I have been provided ample opportunity to ask questions regarding this consent and the Services and have had my questions answered to my satisfaction. I give my informed consent for the services to be provided through the use of telemedicine in my medical care  '

## 2024-02-22 DIAGNOSIS — Z01818 Encounter for other preprocedural examination: Secondary | ICD-10-CM | POA: Diagnosis not present

## 2024-02-28 DIAGNOSIS — I10 Essential (primary) hypertension: Secondary | ICD-10-CM | POA: Diagnosis not present

## 2024-02-28 DIAGNOSIS — E782 Mixed hyperlipidemia: Secondary | ICD-10-CM | POA: Diagnosis not present

## 2024-02-28 DIAGNOSIS — E663 Overweight: Secondary | ICD-10-CM | POA: Diagnosis not present

## 2024-02-28 DIAGNOSIS — Z6829 Body mass index (BMI) 29.0-29.9, adult: Secondary | ICD-10-CM | POA: Diagnosis not present

## 2024-02-28 DIAGNOSIS — Z8639 Personal history of other endocrine, nutritional and metabolic disease: Secondary | ICD-10-CM | POA: Diagnosis not present

## 2024-02-28 DIAGNOSIS — K760 Fatty (change of) liver, not elsewhere classified: Secondary | ICD-10-CM | POA: Diagnosis not present

## 2024-03-27 DIAGNOSIS — Z6828 Body mass index (BMI) 28.0-28.9, adult: Secondary | ICD-10-CM | POA: Diagnosis not present

## 2024-03-27 DIAGNOSIS — K760 Fatty (change of) liver, not elsewhere classified: Secondary | ICD-10-CM | POA: Diagnosis not present

## 2024-03-27 DIAGNOSIS — Z8639 Personal history of other endocrine, nutritional and metabolic disease: Secondary | ICD-10-CM | POA: Diagnosis not present

## 2024-03-27 DIAGNOSIS — E663 Overweight: Secondary | ICD-10-CM | POA: Diagnosis not present

## 2024-03-27 DIAGNOSIS — I10 Essential (primary) hypertension: Secondary | ICD-10-CM | POA: Diagnosis not present

## 2024-03-27 DIAGNOSIS — E782 Mixed hyperlipidemia: Secondary | ICD-10-CM | POA: Diagnosis not present

## 2024-03-30 ENCOUNTER — Encounter: Payer: Self-pay | Admitting: Nurse Practitioner

## 2024-03-30 ENCOUNTER — Ambulatory Visit: Attending: Cardiovascular Disease | Admitting: Nurse Practitioner

## 2024-03-30 DIAGNOSIS — M1711 Unilateral primary osteoarthritis, right knee: Secondary | ICD-10-CM | POA: Diagnosis not present

## 2024-03-30 DIAGNOSIS — Z01810 Encounter for preprocedural cardiovascular examination: Secondary | ICD-10-CM | POA: Diagnosis not present

## 2024-03-30 NOTE — Progress Notes (Signed)
 Virtual Visit via Telephone Note   Because of Patricia [name] co-morbid illnesses, she is at least at moderate risk for complications without adequate follow up.  This format is felt to be most appropriate for this patient at this time.  Due to technical limitations with video connection (technology), today'[name] appointment will be conducted as an audio only telehealth visit, and Patricia [name] verbally agreed to proceed in this manner.   All issues noted in this document were discussed and addressed.  No physical exam could be performed with this format.  Evaluation Performed:  Preoperative cardiovascular risk assessment _____________   Date:  03/30/2024   Patient ID:  Patricia [name], DOB [birth year], MRN [id number] Patient Location:  Home Provider location:   Office  Primary Care Provider:  Glena Landau, MD Primary Cardiologist:  Patricia Gathers, MD  Chief Complaint / Patient Profile   73 y.o. 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 Bernard [name] on 04/26/24 and presents today for telephonic preoperative cardiovascular risk assessment.  History of Present Illness    Patricia [name] is a 73 y.o. female who presents via audio/video conferencing for a telehealth visit today.  Pt was last seen in cardiology clinic on 09/09/23 by Dr. Renna [name].  At that time Patricia [name] was doing well.  The patient is now pending procedure as outlined above. Since her last visit, she denies shortness of breath, fatigue, presyncope, syncope, orthopnea, and PND. She reports occasional chest pain in the front of her chest and in her back that is accompanied by pain in her right jaw. This has been present for several years and has been occurring at random times, not worsened with exertion. She feels that it has not worsened over the past few months. She has chronic  LE lymphedema for which she uses pumps. Occasional skipped beats that last only a few seconds, low burden. She is able to achieve > 4 METS activity without concerning cardiac symptoms.   Past Medical History    Past Medical History:  Diagnosis Date   Aneurysm of aorta (HCC)    Arthritis    Atrophic vaginitis    Cellulitis and abscess of right leg 05/07/2018   Depression    Diverticulitis    DYSPNEA 06/04/2009   Qualifier: Diagnosis of  By: Patricia Greaser MD, Patricia [name].     Edema of both legs    She takes Lasix a couple of times per week to decrease swelling.     Fatty liver    GERD (gastroesophageal reflux disease)    History of kidney stones    Hyperlipidemia    Hypertension    Keratosis    hands and lower limbs   Lymphedema    legs   Memory difficulty    Obesity    Reflux    Shingles    Venous stasis    lower legs   Past Surgical History:  Procedure Laterality Date   ABDOMINAL HYSTERECTOMY     APPENDECTOMY     AUGMENTATION MAMMAPLASTY Bilateral    Basal and Squamous cell cancers excised     CESAREAN SECTION     X 2   CHOLECYSTECTOMY N/A 11/02/2023   Procedure: LAPAROSCOPIC CHOLECYSTECTOMY;  Surgeon: Patricia Chandler, MD;  Location: [facility] OR;  Service: General;  Laterality: N/A;  ASSIST: Patricia [name]   CYSTOSCOPY/RETROGRADE/URETEROSCOPY/STONE EXTRACTION WITH BASKET     removal couldnt  blast   EYE SURGERY     bilateral cataract with lens implants   lipoma surgery     LIPOSUCTION     knees   TOTAL KNEE ARTHROPLASTY Left 09/26/2018   Procedure: TOTAL KNEE ARTHROPLASTY;  Surgeon: Patricia Crew, MD;  Location: [facility] ORS;  Service: Orthopedics;  Laterality: Left;  70 mins   tummy tuck      Allergies  Allergies  Allergen Reactions   Ciprofloxacin  Other (See Comments)    Pt has a descending heart aneurism and has been instructed not to take Cipro .    Clindamycin/Lincomycin Other (See Comments)    Stomach pain   Morphine Itching   Tetracyclines & Related Other (See Comments)    Stomach  pain    Home Medications    Prior to Admission medications   Medication Sig Start Date End Date Taking? Authorizing Provider  acetaminophen  (TYLENOL ) 650 MG CR tablet Take 650 mg by mouth every 8 (eight) hours as needed for pain.    [provider]  ALPRAZolam  (XANAX ) 1 MG tablet Take 1 mg by mouth as needed (anxiety when flying). 02/23/21   [provider]  amLODipine  (NORVASC ) 5 MG tablet Take 5 mg by mouth daily.    [provider]  Amphetamine Sulfate 10 MG TABS Take 10 mg by mouth daily as needed (focusing).    [provider]  aspirin  EC 81 MG tablet Take 1 tablet (81 mg total) by mouth daily. Swallow whole. 08/24/22   Patricia Madura, MD  desvenlafaxine (PRISTIQ) 50 MG 24 hr tablet Take 50-100 mg by mouth See admin instructions. Take 50 mg daily, may increase to 100 mg daily as needed for depression    [provider]  dicyclomine (BENTYL) 10 MG capsule Take 10 mg by mouth 3 (three) times daily as needed for spasms. 10/18/23   [provider]  metoprolol  succinate (TOPROL -XL) 50 MG 24 hr tablet TAKE ONE TABLET BY MOUTH ONE TIME DAILY WITH OR IMMEDIATELY FOLLOWING A MEAL 12/22/23   Patricia Madura, MD  oxyCODONE  (ROXICODONE ) 5 MG immediate release tablet Take 1 tablet (5 mg total) by mouth every 6 (six) hours as needed for severe pain (pain score 7-10). 11/02/23   Patricia Chandler, MD  pantoprazole  (PROTONIX ) 40 MG tablet take 1 tablet by mouth 1 hour before eating twice a day until next office visit 12/26/23   [name], Patricia S, MD  polyethylene glycol powder (GLYCOLAX /MIRALAX ) 17 GM/SCOOP powder Take 17 g by mouth daily.    [provider]  rosuvastatin  (CRESTOR ) 20 MG tablet TAKE ONE TABLET BY MOUTH ONE TIME DAILY 12/15/23   Patricia Madura, MD  triamterene -hydrochlorothiazide  (MAXZIDE -25) 37.5-25 MG per tablet Take 1 tablet by mouth daily.      [provider]  WEGOVY 0.25 MG/0.5ML SOAJ Inject 0.25 mg into the skin once a week.     [provider]    Physical Exam    Vital Signs:  Patricia [name] does not have vital signs available for review today.  Given telephonic nature of communication, physical exam is limited. AAOx3. NAD. Normal affect.  Speech and respirations are unlabored.  Accessory Clinical Findings    None  Assessment & Plan    1.  Preoperative Cardiovascular Risk Assessment: According to the Revised Cardiac Risk Index (RCRI), her Perioperative Risk of Major Cardiac Event is (%): 0.9. Her Functional Capacity in METs is: 8.97 according to the Duke Activity Status Index (DASI). The patient is doing  well from a cardiac perspective. Therefore, based on ACC/AHA guidelines, the patient would be at acceptable risk for the planned procedure without further cardiovascular testing.   The patient was advised that if she develops new symptoms prior to surgery to contact our office to arrange for a follow-up visit, and she verbalized understanding.  Per office protocol, he may hold aspirin  for 5-7 days prior to procedure and should resume as soon as hemodynamically stable postoperatively.  A copy of this note will be routed to requesting surgeon.  Time:   Today, I have spent 10 minutes with the patient with telehealth technology discussing medical history, symptoms, and management plan.    Gerldine Koch, NP-C  03/30/2024, 1:25 PM [address], [address] [place], [place] [address] Office [phone] Fax [phone]

## 2024-04-17 DIAGNOSIS — M25561 Pain in right knee: Secondary | ICD-10-CM | POA: Diagnosis not present

## 2024-04-17 DIAGNOSIS — M1711 Unilateral primary osteoarthritis, right knee: Secondary | ICD-10-CM | POA: Diagnosis not present

## 2024-04-17 DIAGNOSIS — G8929 Other chronic pain: Secondary | ICD-10-CM | POA: Diagnosis not present

## 2024-04-17 DIAGNOSIS — M25661 Stiffness of right knee, not elsewhere classified: Secondary | ICD-10-CM | POA: Diagnosis not present

## 2024-04-25 DIAGNOSIS — I10 Essential (primary) hypertension: Secondary | ICD-10-CM | POA: Diagnosis not present

## 2024-04-25 DIAGNOSIS — Z6827 Body mass index (BMI) 27.0-27.9, adult: Secondary | ICD-10-CM | POA: Diagnosis not present

## 2024-04-25 DIAGNOSIS — E782 Mixed hyperlipidemia: Secondary | ICD-10-CM | POA: Diagnosis not present

## 2024-04-25 DIAGNOSIS — K760 Fatty (change of) liver, not elsewhere classified: Secondary | ICD-10-CM | POA: Diagnosis not present

## 2024-04-25 DIAGNOSIS — E663 Overweight: Secondary | ICD-10-CM | POA: Diagnosis not present

## 2024-04-25 DIAGNOSIS — Z8639 Personal history of other endocrine, nutritional and metabolic disease: Secondary | ICD-10-CM | POA: Diagnosis not present

## 2024-04-26 DIAGNOSIS — M1711 Unilateral primary osteoarthritis, right knee: Secondary | ICD-10-CM | POA: Diagnosis not present

## 2024-04-26 DIAGNOSIS — G8918 Other acute postprocedural pain: Secondary | ICD-10-CM | POA: Diagnosis not present

## 2024-04-30 DIAGNOSIS — M25561 Pain in right knee: Secondary | ICD-10-CM | POA: Diagnosis not present

## 2024-05-02 DIAGNOSIS — M25561 Pain in right knee: Secondary | ICD-10-CM | POA: Diagnosis not present

## 2024-05-04 ENCOUNTER — Inpatient Hospital Stay (HOSPITAL_COMMUNITY)
Admission: EM | Admit: 2024-05-04 | Discharge: 2024-05-09 | DRG: 378 | Disposition: A | Discharge status: Home / Self Care | Attending: Internal Medicine | Admitting: Internal Medicine

## 2024-05-04 ENCOUNTER — Emergency Department (HOSPITAL_COMMUNITY)

## 2024-05-04 DIAGNOSIS — R000 Tachycardia, unspecified: Secondary | ICD-10-CM | POA: Diagnosis not present

## 2024-05-04 DIAGNOSIS — Z791 Long term (current) use of non-steroidal anti-inflammatories (NSAID): Secondary | ICD-10-CM

## 2024-05-04 DIAGNOSIS — F32A Depression, unspecified: Secondary | ICD-10-CM | POA: Diagnosis present

## 2024-05-04 DIAGNOSIS — Z79899 Other long term (current) drug therapy: Secondary | ICD-10-CM

## 2024-05-04 DIAGNOSIS — K922 Gastrointestinal hemorrhage, unspecified: Principal | ICD-10-CM | POA: Diagnosis present

## 2024-05-04 DIAGNOSIS — T39395A Adverse effect of other nonsteroidal anti-inflammatory drugs [NSAID], initial encounter: Secondary | ICD-10-CM | POA: Diagnosis present

## 2024-05-04 DIAGNOSIS — R42 Dizziness and giddiness: Secondary | ICD-10-CM | POA: Diagnosis not present

## 2024-05-04 DIAGNOSIS — E785 Hyperlipidemia, unspecified: Secondary | ICD-10-CM | POA: Diagnosis present

## 2024-05-04 DIAGNOSIS — Z7982 Long term (current) use of aspirin: Secondary | ICD-10-CM

## 2024-05-04 DIAGNOSIS — I341 Nonrheumatic mitral (valve) prolapse: Secondary | ICD-10-CM | POA: Diagnosis not present

## 2024-05-04 DIAGNOSIS — E663 Overweight: Secondary | ICD-10-CM | POA: Diagnosis present

## 2024-05-04 DIAGNOSIS — K920 Hematemesis: Secondary | ICD-10-CM | POA: Diagnosis not present

## 2024-05-04 DIAGNOSIS — K269 Duodenal ulcer, unspecified as acute or chronic, without hemorrhage or perforation: Secondary | ICD-10-CM | POA: Diagnosis not present

## 2024-05-04 DIAGNOSIS — M25561 Pain in right knee: Secondary | ICD-10-CM | POA: Diagnosis not present

## 2024-05-04 DIAGNOSIS — R1084 Generalized abdominal pain: Secondary | ICD-10-CM | POA: Diagnosis not present

## 2024-05-04 DIAGNOSIS — R10819 Abdominal tenderness, unspecified site: Secondary | ICD-10-CM | POA: Diagnosis not present

## 2024-05-04 DIAGNOSIS — D62 Acute posthemorrhagic anemia: Secondary | ICD-10-CM | POA: Diagnosis present

## 2024-05-04 DIAGNOSIS — Z87891 Personal history of nicotine dependence: Secondary | ICD-10-CM

## 2024-05-04 DIAGNOSIS — I872 Venous insufficiency (chronic) (peripheral): Secondary | ICD-10-CM | POA: Diagnosis present

## 2024-05-04 DIAGNOSIS — Z6827 Body mass index (BMI) 27.0-27.9, adult: Secondary | ICD-10-CM

## 2024-05-04 DIAGNOSIS — K219 Gastro-esophageal reflux disease without esophagitis: Secondary | ICD-10-CM | POA: Diagnosis present

## 2024-05-04 DIAGNOSIS — I7121 Aneurysm of the ascending aorta, without rupture: Secondary | ICD-10-CM | POA: Diagnosis present

## 2024-05-04 DIAGNOSIS — R109 Unspecified abdominal pain: Secondary | ICD-10-CM | POA: Diagnosis not present

## 2024-05-04 DIAGNOSIS — I959 Hypotension, unspecified: Secondary | ICD-10-CM | POA: Diagnosis not present

## 2024-05-04 DIAGNOSIS — Z8249 Family history of ischemic heart disease and other diseases of the circulatory system: Secondary | ICD-10-CM

## 2024-05-04 DIAGNOSIS — K317 Polyp of stomach and duodenum: Secondary | ICD-10-CM | POA: Diagnosis present

## 2024-05-04 DIAGNOSIS — Z90710 Acquired absence of both cervix and uterus: Secondary | ICD-10-CM

## 2024-05-04 DIAGNOSIS — Z96653 Presence of artificial knee joint, bilateral: Secondary | ICD-10-CM | POA: Diagnosis present

## 2024-05-04 DIAGNOSIS — K7581 Nonalcoholic steatohepatitis (NASH): Secondary | ICD-10-CM | POA: Diagnosis present

## 2024-05-04 DIAGNOSIS — R1030 Lower abdominal pain, unspecified: Secondary | ICD-10-CM | POA: Diagnosis not present

## 2024-05-04 DIAGNOSIS — K7469 Other cirrhosis of liver: Secondary | ICD-10-CM | POA: Diagnosis present

## 2024-05-04 DIAGNOSIS — K449 Diaphragmatic hernia without obstruction or gangrene: Secondary | ICD-10-CM | POA: Diagnosis present

## 2024-05-04 DIAGNOSIS — I10 Essential (primary) hypertension: Secondary | ICD-10-CM | POA: Diagnosis present

## 2024-05-04 DIAGNOSIS — K5900 Constipation, unspecified: Secondary | ICD-10-CM | POA: Diagnosis not present

## 2024-05-04 DIAGNOSIS — K5730 Diverticulosis of large intestine without perforation or abscess without bleeding: Secondary | ICD-10-CM | POA: Diagnosis present

## 2024-05-04 DIAGNOSIS — K264 Chronic or unspecified duodenal ulcer with hemorrhage: Secondary | ICD-10-CM | POA: Diagnosis not present

## 2024-05-04 DIAGNOSIS — F419 Anxiety disorder, unspecified: Secondary | ICD-10-CM | POA: Diagnosis present

## 2024-05-04 LAB — COMPREHENSIVE METABOLIC PANEL WITH GFR
ALT: 17 U/L (ref 0–44)
AST: 20 U/L (ref 15–41)
Albumin: 2.7 g/dL — ABNORMAL LOW (ref 3.5–5.0)
Alkaline Phosphatase: 82 U/L (ref 38–126)
Anion gap: 8 (ref 5–15)
BUN: 35 mg/dL — ABNORMAL HIGH (ref 8–23)
CO2: 26 mmol/L (ref 22–32)
Calcium: 8.3 mg/dL — ABNORMAL LOW (ref 8.9–10.3)
Chloride: 105 mmol/L (ref 98–111)
Creatinine, Ser: 0.69 mg/dL (ref 0.44–1.00)
GFR, Estimated: >60 mL/min (ref >60)
Glucose, Bld: 119 mg/dL — ABNORMAL HIGH (ref 70–99)
Potassium: 3.8 mmol/L (ref 3.5–5.1)
Sodium: 139 mmol/L (ref 135–145)
Total Bilirubin: 1.5 mg/dL — ABNORMAL HIGH (ref 0.0–1.2)
Total Protein: 5.6 g/dL — ABNORMAL LOW (ref 6.5–8.1)

## 2024-05-04 LAB — CBC WITH DIFFERENTIAL/PLATELET
Abs Immature Granulocytes: 0.08 K/uL — ABNORMAL HIGH (ref 0.00–0.07)
Basophils Absolute: 0.1 K/uL (ref 0.0–0.1)
Basophils Relative: 1 %
Eosinophils Absolute: 0.3 K/uL (ref 0.0–0.5)
Eosinophils Relative: 3 %
HCT: 32.4 % — ABNORMAL LOW (ref 36.0–46.0)
Hemoglobin: 10.4 g/dL — ABNORMAL LOW (ref 12.0–15.0)
Immature Granulocytes: 1 %
Lymphocytes Relative: 9 %
Lymphs Abs: 0.9 K/uL (ref 0.7–4.0)
MCH: 30.9 pg (ref 26.0–34.0)
MCHC: 32.1 g/dL (ref 30.0–36.0)
MCV: 96.1 fL (ref 80.0–100.0)
Monocytes Absolute: 0.6 K/uL (ref 0.1–1.0)
Monocytes Relative: 6 %
Neutro Abs: 7.9 K/uL — ABNORMAL HIGH (ref 1.7–7.7)
Neutrophils Relative %: 80 %
Platelets: 287 K/uL (ref 150–400)
RBC: 3.37 MIL/uL — ABNORMAL LOW (ref 3.87–5.11)
RDW: 13.4 % (ref 11.5–15.5)
WBC: 9.9 K/uL (ref 4.0–10.5)
nRBC: 0 % (ref 0.0–0.2)

## 2024-05-04 LAB — TYPE AND SCREEN
ABO/RH(D): O POS
Antibody Screen: NEGATIVE

## 2024-05-04 LAB — LIPASE, BLOOD: Lipase: 42 U/L (ref 11–51)

## 2024-05-04 LAB — POC OCCULT BLOOD, ED: Fecal Occult Bld: POSITIVE — AB

## 2024-05-04 MED ORDER — LACTATED RINGERS IV BOLUS
500.0000 mL | Freq: Once | INTRAVENOUS | Status: AC
  Administered 2024-05-04: 500 mL via INTRAVENOUS

## 2024-05-04 MED ORDER — IOHEXOL 350 MG/ML SOLN
100.0000 mL | Freq: Once | INTRAVENOUS | Status: AC | PRN
Start: 2024-05-04 — End: 2024-05-05
  Administered 2024-05-05: 100 mL via INTRAVENOUS

## 2024-05-04 NOTE — ED Triage Notes (Signed)
 Patient arrived via [facility] with complaints of vomiting, abdominal pain, and dizziness when standing. States she had a knee replacement 7/10 with no complications. Also taking antibiotics for a cat bite on 7/16. Given 700cc LR prior to arrival.

## 2024-05-04 NOTE — ED Provider Notes (Signed)
 [place] [facility]PARTMENT AT [facility] Provider Note   CSN: [id number] Arrival date & time: 05/04/24  2117     Patient presents with: Post-op Problem and Abdominal Pain   Patricia [name] is a 73 y.o. female.    Abdominal Pain    Patient has a history of reflux.  Aneurysm, hypertension, hyperlipidemia, diverticulitis.  Patient had knee replacement surgery on July 10.  Patient states she has been doing well since then.  Patient also had a cat bite on July 16.  Patient was started on antibiotics as a precaution.  She also has been taking some Aleve.  Patient states this evening she started to have some generalized abdominal discomfort.  She started to feel nauseous and dizzy.  Patient felt like she was going to vomit.  She went to the bathroom and had an episode of hematemesis.  Patient also felt lightheaded when standing as if she was going to pass out.  EMS arrived.  They did feel that she had blood in her emesis.  She was given IV fluids.  Patient has not noticed any blood in her stool.  She denies any history of GI bleeding  Prior to Admission medications   Medication Sig Start Date End Date Taking? Authorizing Provider  acetaminophen  (TYLENOL ) 650 MG CR tablet Take 650 mg by mouth every 8 (eight) hours as needed for pain.    [provider]  ALPRAZolam  (XANAX ) 1 MG tablet Take 1 mg by mouth as needed (anxiety when flying). 02/23/21   [provider]  amLODipine  (NORVASC ) 5 MG tablet Take 5 mg by mouth daily.    [provider]  Amphetamine Sulfate 10 MG TABS Take 10 mg by mouth daily as needed (focusing).    [provider]  aspirin  EC 81 MG tablet Take 1 tablet (81 mg total) by mouth daily. Swallow whole. 08/24/22   Jeffrie Oneil JAYSON, MD  desvenlafaxine (PRISTIQ) 50 MG 24 hr tablet Take 50-100 mg by mouth See admin instructions. Take 50 mg daily, may increase to 100 mg daily as needed for depression    [provider]   dicyclomine (BENTYL) 10 MG capsule Take 10 mg by mouth 3 (three) times daily as needed for spasms. 10/18/23   [provider]  metoprolol  succinate (TOPROL -XL) 50 MG 24 hr tablet TAKE ONE TABLET BY MOUTH ONE TIME DAILY WITH OR IMMEDIATELY FOLLOWING A MEAL 12/22/23   Jeffrie Oneil JAYSON, MD  oxyCODONE  (ROXICODONE ) 5 MG immediate release tablet Take 1 tablet (5 mg total) by mouth every 6 (six) hours as needed for severe pain (pain score 7-10). 11/02/23   Curvin Deward MOULD, MD  pantoprazole  (PROTONIX ) 40 MG tablet take 1 tablet by mouth 1 hour before eating twice a day until next office visit 12/26/23   Byrum, Robert S, MD  polyethylene glycol powder (GLYCOLAX /MIRALAX ) 17 GM/SCOOP powder Take 17 g by mouth daily.    [provider]  rosuvastatin  (CRESTOR ) 20 MG tablet TAKE ONE TABLET BY MOUTH ONE TIME DAILY 12/15/23   Jeffrie Oneil JAYSON, MD  triamterene -hydrochlorothiazide  (MAXZIDE -25) 37.5-25 MG per tablet Take 1 tablet by mouth daily.      [provider]  WEGOVY 0.25 MG/0.5ML SOAJ Inject 0.25 mg into the skin once a week.    [provider]    Allergies: Ciprofloxacin , Clindamycin/lincomycin, Morphine, and Tetracyclines & related    Review of Systems  Gastrointestinal:  Positive for abdominal pain.    Updated Vital Signs BP 120/64  Pulse 81   Temp 98.2 F (36.8 C) (Oral)   Resp 15   SpO2 96%   Physical Exam Vitals and nursing note reviewed.  Constitutional:      General: She is not in acute distress.    Appearance: She is well-developed.  HENT:     Head: Normocephalic and atraumatic.     Right Ear: External ear normal.     Left Ear: External ear normal.  Eyes:     General: No scleral icterus.       Right eye: No discharge.        Left eye: No discharge.     Conjunctiva/sclera: Conjunctivae normal.  Neck:     Trachea: No tracheal deviation.  Cardiovascular:     Rate and Rhythm: Normal rate and regular rhythm.  Pulmonary:     Effort: Pulmonary effort is  normal. No respiratory distress.     Breath sounds: Normal breath sounds. No stridor. No wheezing or rales.  Abdominal:     General: Bowel sounds are normal. There is no distension.     Palpations: Abdomen is soft.     Tenderness: There is no abdominal tenderness. There is no guarding or rebound.  Musculoskeletal:        General: No tenderness or deformity.     Cervical back: Neck supple.  Skin:    General: Skin is warm and dry.     Findings: No rash.  Neurological:     General: No focal deficit present.     Mental Status: She is alert.     Cranial Nerves: No cranial nerve deficit, dysarthria or facial asymmetry.     Sensory: No sensory deficit.     Motor: No abnormal muscle tone or seizure activity.     Coordination: Coordination normal.  Psychiatric:        Mood and Affect: Mood normal.     (all labs ordered are listed, but only abnormal results are displayed) Labs Reviewed  COMPREHENSIVE METABOLIC PANEL WITH GFR - Abnormal; Notable for the following components:      Result Value   Glucose, Bld 119 (*)    BUN 35 (*)    Calcium  8.3 (*)    Total Protein 5.6 (*)    Albumin 2.7 (*)    Total Bilirubin 1.5 (*)    All other components within normal limits  CBC WITH DIFFERENTIAL/PLATELET - Abnormal; Notable for the following components:   RBC 3.37 (*)    Hemoglobin 10.4 (*)    HCT 32.4 (*)    Neutro Abs 7.9 (*)    Abs Immature Granulocytes 0.08 (*)    All other components within normal limits  POC OCCULT BLOOD, ED - Abnormal; Notable for the following components:   Fecal Occult Bld POSITIVE (*)    All other components within normal limits  LIPASE, BLOOD  URINALYSIS, ROUTINE W REFLEX MICROSCOPIC  TYPE AND SCREEN    EKG: EKG Interpretation Date/Time:  Friday May 04 2024 21:36:05 EDT Ventricular Rate:  92 PR Interval:  150 QRS Duration:  88 QT Interval:  351 QTC Calculation: 435 R Axis:   -29  Text Interpretation: Sinus rhythm Borderline left axis deviation Low  voltage, precordial leads Since last tracing rate faster Confirmed by Randol Simmonds [phone]) on 05/04/2024 9:47:54 PM  Radiology: CT Angio Chest Aorta W and/or Wo Contrast Result Date: 05/05/2024 EXAM: CTA CHEST PE WITHOUT AND WITH CONTRAST CT ABDOMEN AND PELVIS WITHOUT AND WITH CONTRAST 05/05/2024 12:08:37 AM TECHNIQUE: CTA of  the chest was performed after the administration of intravenous contrast. Multiplanar reformatted images are provided for review. MIP images are provided for review. CT of the abdomen and pelvis was performed with the administration of intravenous contrast. Automated exposure control, iterative reconstruction, and/or weight based adjustment of the mA/kV was utilized to reduce the radiation dose to as low as reasonably achievable. COMPARISON: CT of chest dated 09/19/2023 and CT abdomen/pelvis dated 03/10/2017. CLINICAL HISTORY: Aortic aneurysm, presents with hematemesis. FINDINGS: CHEST: PULMONARY ARTERIES: Pulmonary arteries are adequately opacified for evaluation. No evidence of pulmonary embolism. MEDIASTINUM: 4.5 cm ascending thoracic aortic aneurysm, unchanged. No evidence of dissection. Thoracic aortic atherosclerosis. Mild coronary arthrosis of the LAD and right coronary artery. Tiny hiatal hernia. LUNGS AND PLEURA: Mild faint bilateral lower lobe atelectasis. No focal consolidation or pulmonary edema. No pleural effusion or pneumothorax. SOFT TISSUES AND BONES: Bilateral breast augmentation. No acute bone or soft tissue abnormality. ABDOMEN AND PELVIS: LIVER: 13 mm simple inferior left hepatic cyst, benign. GALLBLADDER AND BILE DUCTS: Gallbladder is unremarkable. No biliary ductal dilatation. SPLEEN: Spleen demonstrates no acute abnormality. PANCREAS: Unremarkable. ADRENAL GLANDS: Adrenal glands demonstrate no acute abnormality. KIDNEYS, URETERS AND BLADDER: Simple bilateral renal cysts, measuring up to 1.9 cm in the right upper kidney, benign (Bosniak 1). No follow up is recommended.  Mild fullness of the left upper pole renal moiety, unchanged, likely reflecting prior proximal ureteral stricture when correlating with prior studies. No stones in the kidneys or ureters. No hydronephrosis. No perinephric or periureteral stranding. Urinary bladder is unremarkable. GI AND BOWEL: Sigmoid diverticulosis, without evidence of diverticulitis. Large duodenal diverticulum. Appendix is not completely visualized. Stomach is within normal limits. There is no bowel obstruction. No abnormal bowel wall thickening or distension. REPRODUCTIVE: Status post hysterectomy. PERITONEUM AND RETROPERITONEUM: No ascites or free air. LYMPH NODES: No lymphadenopathy. BONES AND SOFT TISSUES: No acute abnormality of the visualized bones. No focal soft tissue abnormality. IMPRESSION: 1. 4.5 cm ascending thoracic aortic aneurysm, unchanged. No evidence of dissection. 2. No pulmonary embolism. 3. Sigmoid diverticulosis without evidence of diverticulitis. 4. Additional ancillary findings as above. Electronically signed by: Pinkie Pebbles MD 05/05/2024 12:18 AM EDT RP Workstation: [id number]   CT ABDOMEN PELVIS W CONTRAST Result Date: 05/05/2024 EXAM: CTA CHEST PE WITHOUT AND WITH CONTRAST CT ABDOMEN AND PELVIS WITHOUT AND WITH CONTRAST 05/05/2024 12:08:37 AM TECHNIQUE: CTA of the chest was performed after the administration of intravenous contrast. Multiplanar reformatted images are provided for review. MIP images are provided for review. CT of the abdomen and pelvis was performed with the administration of intravenous contrast. Automated exposure control, iterative reconstruction, and/or weight based adjustment of the mA/kV was utilized to reduce the radiation dose to as low as reasonably achievable. COMPARISON: CT of chest dated 09/19/2023 and CT abdomen/pelvis dated 03/10/2017. CLINICAL HISTORY: Aortic aneurysm, presents with hematemesis. FINDINGS: CHEST: PULMONARY ARTERIES: Pulmonary arteries are adequately opacified for  evaluation. No evidence of pulmonary embolism. MEDIASTINUM: 4.5 cm ascending thoracic aortic aneurysm, unchanged. No evidence of dissection. Thoracic aortic atherosclerosis. Mild coronary arthrosis of the LAD and right coronary artery. Tiny hiatal hernia. LUNGS AND PLEURA: Mild faint bilateral lower lobe atelectasis. No focal consolidation or pulmonary edema. No pleural effusion or pneumothorax. SOFT TISSUES AND BONES: Bilateral breast augmentation. No acute bone or soft tissue abnormality. ABDOMEN AND PELVIS: LIVER: 13 mm simple inferior left hepatic cyst, benign. GALLBLADDER AND BILE DUCTS: Gallbladder is unremarkable. No biliary ductal dilatation. SPLEEN: Spleen demonstrates no acute abnormality. PANCREAS: Unremarkable. ADRENAL GLANDS: Adrenal glands demonstrate no  acute abnormality. KIDNEYS, URETERS AND BLADDER: Simple bilateral renal cysts, measuring up to 1.9 cm in the right upper kidney, benign (Bosniak 1). No follow up is recommended. Mild fullness of the left upper pole renal moiety, unchanged, likely reflecting prior proximal ureteral stricture when correlating with prior studies. No stones in the kidneys or ureters. No hydronephrosis. No perinephric or periureteral stranding. Urinary bladder is unremarkable. GI AND BOWEL: Sigmoid diverticulosis, without evidence of diverticulitis. Large duodenal diverticulum. Appendix is not completely visualized. Stomach is within normal limits. There is no bowel obstruction. No abnormal bowel wall thickening or distension. REPRODUCTIVE: Status post hysterectomy. PERITONEUM AND RETROPERITONEUM: No ascites or free air. LYMPH NODES: No lymphadenopathy. BONES AND SOFT TISSUES: No acute abnormality of the visualized bones. No focal soft tissue abnormality. IMPRESSION: 1. 4.5 cm ascending thoracic aortic aneurysm, unchanged. No evidence of dissection. 2. No pulmonary embolism. 3. Sigmoid diverticulosis without evidence of diverticulitis. 4. Additional ancillary findings as  above. Electronically signed by: Pinkie Pebbles MD 05/05/2024 12:18 AM EDT RP Workstation: [id number]     .Critical Care  Performed by: Randol Simmonds, MD Authorized by: Randol Simmonds, MD   Critical care provider statement:    Critical care time (minutes):  30   Critical care was time spent personally by me on the following activities:  Development of treatment plan with patient or surrogate, discussions with consultants, evaluation of patient's response to treatment, examination of patient, ordering and review of laboratory studies, ordering and review of radiographic studies, ordering and performing treatments and interventions, pulse oximetry, re-evaluation of patient's condition and review of old charts    Medications Ordered in the ED  lactated ringers  bolus 500 mL (0 mLs Intravenous Stopped 05/04/24 2340)  iohexol  (OMNIPAQUE ) 350 MG/ML injection 100 mL (100 mLs Intravenous Contrast Given 05/05/24 0002)    Clinical Course as of 05/05/24 0024  Fri May 04, 2024  2150 Fecal occult positive [JK]  2307 CBC with Diff(!) Hemoglobin decreased compared to previous. [JK]    Clinical Course User Index [JK] Randol Simmonds, MD                                 Medical Decision Making Problems Addressed: Acute GI bleeding: acute illness or injury that poses a threat to life or bodily functions  Amount and/or Complexity of Data Reviewed Labs: ordered. Decision-making details documented in ED Course. Radiology: ordered.  Risk Prescription drug management.   Patient presented to the ER for evaluation of acute upper GI bleed.  Patient reported hematemesis prior to arrival.  No recurrent episodes in the ED at this point.   hemoglobin has decreased since previous values.  She has remained normotensive at this time.  No indication for acute transfusion With her history of aortic aneurysm we will plan on CT scan to make sure there is no signs of any aortoenteric fistula.  Pt will require admission to  the hospital for further evaluation,.   CT scan pending at shfit change.  Care turned over to Dr Nettie      Final diagnoses:  Acute GI bleeding    ED Discharge Orders     None          Randol Simmonds, MD 05/05/24 [phone]

## 2024-05-05 ENCOUNTER — Encounter (HOSPITAL_COMMUNITY)
Admission: EM | Disposition: A | Discharge status: Home / Self Care | Payer: Self-pay | Source: Home / Self Care | Attending: Family Medicine

## 2024-05-05 ENCOUNTER — Encounter (HOSPITAL_COMMUNITY): Payer: Self-pay | Admitting: Internal Medicine

## 2024-05-05 ENCOUNTER — Inpatient Hospital Stay (HOSPITAL_COMMUNITY): Admitting: Anesthesiology

## 2024-05-05 ENCOUNTER — Other Ambulatory Visit: Payer: Self-pay

## 2024-05-05 DIAGNOSIS — K449 Diaphragmatic hernia without obstruction or gangrene: Secondary | ICD-10-CM | POA: Diagnosis not present

## 2024-05-05 DIAGNOSIS — T39395A Adverse effect of other nonsteroidal anti-inflammatory drugs [NSAID], initial encounter: Secondary | ICD-10-CM | POA: Diagnosis not present

## 2024-05-05 DIAGNOSIS — K2270 Barrett's esophagus without dysplasia: Secondary | ICD-10-CM | POA: Diagnosis not present

## 2024-05-05 DIAGNOSIS — F32A Depression, unspecified: Secondary | ICD-10-CM | POA: Diagnosis not present

## 2024-05-05 DIAGNOSIS — K269 Duodenal ulcer, unspecified as acute or chronic, without hemorrhage or perforation: Secondary | ICD-10-CM

## 2024-05-05 DIAGNOSIS — I7121 Aneurysm of the ascending aorta, without rupture: Secondary | ICD-10-CM | POA: Diagnosis not present

## 2024-05-05 DIAGNOSIS — K219 Gastro-esophageal reflux disease without esophagitis: Secondary | ICD-10-CM | POA: Diagnosis not present

## 2024-05-05 DIAGNOSIS — R1013 Epigastric pain: Secondary | ICD-10-CM | POA: Diagnosis not present

## 2024-05-05 DIAGNOSIS — I341 Nonrheumatic mitral (valve) prolapse: Secondary | ICD-10-CM

## 2024-05-05 DIAGNOSIS — E785 Hyperlipidemia, unspecified: Secondary | ICD-10-CM | POA: Diagnosis not present

## 2024-05-05 DIAGNOSIS — Z96653 Presence of artificial knee joint, bilateral: Secondary | ICD-10-CM | POA: Diagnosis not present

## 2024-05-05 DIAGNOSIS — E663 Overweight: Secondary | ICD-10-CM | POA: Diagnosis not present

## 2024-05-05 DIAGNOSIS — K920 Hematemesis: Secondary | ICD-10-CM | POA: Diagnosis not present

## 2024-05-05 DIAGNOSIS — Z87891 Personal history of nicotine dependence: Secondary | ICD-10-CM | POA: Diagnosis not present

## 2024-05-05 DIAGNOSIS — K5730 Diverticulosis of large intestine without perforation or abscess without bleeding: Secondary | ICD-10-CM | POA: Diagnosis not present

## 2024-05-05 DIAGNOSIS — Z6827 Body mass index (BMI) 27.0-27.9, adult: Secondary | ICD-10-CM | POA: Diagnosis not present

## 2024-05-05 DIAGNOSIS — K264 Chronic or unspecified duodenal ulcer with hemorrhage: Secondary | ICD-10-CM | POA: Diagnosis not present

## 2024-05-05 DIAGNOSIS — K7581 Nonalcoholic steatohepatitis (NASH): Secondary | ICD-10-CM | POA: Diagnosis not present

## 2024-05-05 DIAGNOSIS — D649 Anemia, unspecified: Secondary | ICD-10-CM | POA: Diagnosis not present

## 2024-05-05 DIAGNOSIS — K922 Gastrointestinal hemorrhage, unspecified: Secondary | ICD-10-CM | POA: Diagnosis not present

## 2024-05-05 DIAGNOSIS — K7689 Other specified diseases of liver: Secondary | ICD-10-CM | POA: Diagnosis not present

## 2024-05-05 DIAGNOSIS — K5750 Diverticulosis of both small and large intestine without perforation or abscess without bleeding: Secondary | ICD-10-CM | POA: Diagnosis not present

## 2024-05-05 DIAGNOSIS — K317 Polyp of stomach and duodenum: Secondary | ICD-10-CM | POA: Diagnosis not present

## 2024-05-05 DIAGNOSIS — Z90710 Acquired absence of both cervix and uterus: Secondary | ICD-10-CM | POA: Diagnosis not present

## 2024-05-05 DIAGNOSIS — I959 Hypotension, unspecified: Secondary | ICD-10-CM | POA: Diagnosis not present

## 2024-05-05 DIAGNOSIS — Z791 Long term (current) use of non-steroidal anti-inflammatories (NSAID): Secondary | ICD-10-CM | POA: Diagnosis not present

## 2024-05-05 DIAGNOSIS — K5900 Constipation, unspecified: Secondary | ICD-10-CM | POA: Diagnosis not present

## 2024-05-05 DIAGNOSIS — K7469 Other cirrhosis of liver: Secondary | ICD-10-CM | POA: Diagnosis not present

## 2024-05-05 DIAGNOSIS — I872 Venous insufficiency (chronic) (peripheral): Secondary | ICD-10-CM | POA: Diagnosis not present

## 2024-05-05 DIAGNOSIS — I10 Essential (primary) hypertension: Secondary | ICD-10-CM

## 2024-05-05 DIAGNOSIS — F419 Anxiety disorder, unspecified: Secondary | ICD-10-CM | POA: Diagnosis not present

## 2024-05-05 DIAGNOSIS — Z7982 Long term (current) use of aspirin: Secondary | ICD-10-CM | POA: Diagnosis not present

## 2024-05-05 DIAGNOSIS — D62 Acute posthemorrhagic anemia: Secondary | ICD-10-CM | POA: Diagnosis not present

## 2024-05-05 DIAGNOSIS — N281 Cyst of kidney, acquired: Secondary | ICD-10-CM | POA: Diagnosis not present

## 2024-05-05 DIAGNOSIS — Z79899 Other long term (current) drug therapy: Secondary | ICD-10-CM | POA: Diagnosis not present

## 2024-05-05 LAB — URINALYSIS, ROUTINE W REFLEX MICROSCOPIC
Bilirubin Urine: NEGATIVE
Glucose, UA: NEGATIVE mg/dL
Hgb urine dipstick: NEGATIVE
Ketones, ur: NEGATIVE mg/dL
Leukocytes,Ua: NEGATIVE
Nitrite: NEGATIVE
Protein, ur: NEGATIVE mg/dL
Specific Gravity, Urine: >1.046 — ABNORMAL HIGH (ref 1.005–1.030)
pH: 5 (ref 5.0–8.0)

## 2024-05-05 LAB — HEMOGLOBIN AND HEMATOCRIT, BLOOD
HCT: 30.2 % — ABNORMAL LOW (ref 36.0–46.0)
HCT: 28.4 % — ABNORMAL LOW (ref 36.0–46.0)
HCT: 26.1 % — ABNORMAL LOW (ref 36.0–46.0)
Hemoglobin: 9.5 g/dL — ABNORMAL LOW (ref 12.0–15.0)
Hemoglobin: 8.9 g/dL — ABNORMAL LOW (ref 12.0–15.0)
Hemoglobin: 8.2 g/dL — ABNORMAL LOW (ref 12.0–15.0)

## 2024-05-05 LAB — COMPREHENSIVE METABOLIC PANEL WITH GFR
ALT: 13 U/L (ref 0–44)
AST: 17 U/L (ref 15–41)
Albumin: 2.6 g/dL — ABNORMAL LOW (ref 3.5–5.0)
Alkaline Phosphatase: 69 U/L (ref 38–126)
Anion gap: 10 (ref 5–15)
BUN: 32 mg/dL — ABNORMAL HIGH (ref 8–23)
CO2: 23 mmol/L (ref 22–32)
Calcium: 8.3 mg/dL — ABNORMAL LOW (ref 8.9–10.3)
Chloride: 105 mmol/L (ref 98–111)
Creatinine, Ser: 0.66 mg/dL (ref 0.44–1.00)
GFR, Estimated: >60 mL/min (ref >60)
Glucose, Bld: 98 mg/dL (ref 70–99)
Potassium: 3.5 mmol/L (ref 3.5–5.1)
Sodium: 138 mmol/L (ref 135–145)
Total Bilirubin: 1.4 mg/dL — ABNORMAL HIGH (ref 0.0–1.2)
Total Protein: 5.2 g/dL — ABNORMAL LOW (ref 6.5–8.1)

## 2024-05-05 LAB — CBC
HCT: 31.9 % — ABNORMAL LOW (ref 36.0–46.0)
Hemoglobin: 10 g/dL — ABNORMAL LOW (ref 12.0–15.0)
MCH: 30.4 pg (ref 26.0–34.0)
MCHC: 31.3 g/dL (ref 30.0–36.0)
MCV: 97 fL (ref 80.0–100.0)
Platelets: 296 K/uL (ref 150–400)
RBC: 3.29 MIL/uL — ABNORMAL LOW (ref 3.87–5.11)
RDW: 13.5 % (ref 11.5–15.5)
WBC: 7.7 K/uL (ref 4.0–10.5)
nRBC: 0 % (ref 0.0–0.2)

## 2024-05-05 LAB — PROTIME-INR
INR: 1.1 (ref 0.8–1.2)
Prothrombin Time: 14.3 s (ref 11.4–15.2)

## 2024-05-05 SURGERY — EGD (ESOPHAGOGASTRODUODENOSCOPY)
Anesthesia: Monitor Anesthesia Care

## 2024-05-05 MED ORDER — SODIUM CHLORIDE 0.9 % IV SOLN: INTRAVENOUS | Status: AC

## 2024-05-05 MED ORDER — SUCRALFATE 1 GM/10ML PO SUSP
1.0000 g | Freq: Three times a day (TID) | ORAL | Status: DC
  Administered 2024-05-05 – 2024-05-09 (×16): 1 g via ORAL
  Filled 2024-05-05 (×16): qty 10

## 2024-05-05 MED ORDER — PROPOFOL 10 MG/ML IV BOLUS
INTRAVENOUS | Status: DC | PRN
  Administered 2024-05-05 (×2): 20 mg via INTRAVENOUS

## 2024-05-05 MED ORDER — PROPOFOL 500 MG/50ML IV EMUL
INTRAVENOUS | Status: DC | PRN
  Administered 2024-05-05: 125 ug/kg/min via INTRAVENOUS

## 2024-05-05 MED ORDER — ALBUTEROL SULFATE (2.5 MG/3ML) 0.083% IN NEBU: 2.5000 mg | INHALATION_SOLUTION | RESPIRATORY_TRACT | Status: DC | PRN

## 2024-05-05 MED ORDER — SODIUM CHLORIDE 0.9 % IV SOLN: INTRAVENOUS | Status: DC | PRN

## 2024-05-05 MED ORDER — FENTANYL CITRATE PF 50 MCG/ML IJ SOSY: 12.5000 ug | PREFILLED_SYRINGE | INTRAMUSCULAR | Status: DC | PRN

## 2024-05-05 MED ORDER — VENLAFAXINE HCL ER 75 MG PO CP24
75.0000 mg | ORAL_CAPSULE | Freq: Every day | ORAL | Status: DC
  Administered 2024-05-05 – 2024-05-09 (×5): 75 mg via ORAL
  Filled 2024-05-05 (×5): qty 1

## 2024-05-05 MED ORDER — AMOXICILLIN-POT CLAVULANATE 875-125 MG PO TABS
1.0000 | ORAL_TABLET | Freq: Two times a day (BID) | ORAL | Status: DC
  Administered 2024-05-05 – 2024-05-08 (×8): 1 via ORAL
  Filled 2024-05-05 (×8): qty 1

## 2024-05-05 MED ORDER — CARMEX CLASSIC LIP BALM EX OINT
TOPICAL_OINTMENT | CUTANEOUS | Status: DC | PRN
  Administered 2024-05-05: 1 via TOPICAL
  Filled 2024-05-05: qty 10

## 2024-05-05 MED ORDER — HYDROCODONE-ACETAMINOPHEN 5-325 MG PO TABS
1.0000 | ORAL_TABLET | ORAL | Status: DC | PRN
  Administered 2024-05-05 – 2024-05-08 (×8): 1 via ORAL
  Filled 2024-05-05 (×9): qty 1

## 2024-05-05 MED ORDER — ONDANSETRON HCL 4 MG PO TABS
4.0000 mg | ORAL_TABLET | Freq: Four times a day (QID) | ORAL | Status: DC | PRN
Start: 2024-05-05 — End: 2024-05-09

## 2024-05-05 MED ORDER — ACETAMINOPHEN 650 MG RE SUPP: 650.0000 mg | Freq: Four times a day (QID) | RECTAL | Status: DC | PRN

## 2024-05-05 MED ORDER — METOPROLOL SUCCINATE ER 50 MG PO TB24
50.0000 mg | ORAL_TABLET | Freq: Every day | ORAL | Status: DC
  Administered 2024-05-05: 50 mg via ORAL
  Filled 2024-05-05 (×2): qty 1

## 2024-05-05 MED ORDER — ONDANSETRON HCL 4 MG/2ML IJ SOLN
4.0000 mg | Freq: Four times a day (QID) | INTRAMUSCULAR | Status: DC | PRN
Start: 2024-05-05 — End: 2024-05-09
  Administered 2024-05-05: 4 mg via INTRAVENOUS
  Filled 2024-05-05: qty 2

## 2024-05-05 MED ORDER — ALPRAZOLAM 0.5 MG PO TABS
1.0000 mg | ORAL_TABLET | ORAL | Status: DC | PRN
  Administered 2024-05-05 – 2024-05-08 (×4): 1 mg via ORAL
  Filled 2024-05-05 (×4): qty 2

## 2024-05-05 MED ORDER — ACETAMINOPHEN 325 MG PO TABS
650.0000 mg | ORAL_TABLET | Freq: Four times a day (QID) | ORAL | Status: DC | PRN
  Administered 2024-05-08: 650 mg via ORAL
  Filled 2024-05-05: qty 2

## 2024-05-05 MED ORDER — PHENYLEPHRINE HCL (PRESSORS) 10 MG/ML IV SOLN
INTRAVENOUS | Status: DC | PRN
Start: 2024-05-05 — End: 2024-05-05
  Administered 2024-05-05 (×4): 160 ug via INTRAVENOUS

## 2024-05-05 MED ORDER — PANTOPRAZOLE SODIUM 40 MG IV SOLR
80.0000 mg | Freq: Two times a day (BID) | INTRAVENOUS | Status: DC
  Administered 2024-05-05 – 2024-05-08 (×8): 80 mg via INTRAVENOUS
  Filled 2024-05-05 (×8): qty 20

## 2024-05-05 NOTE — Op Note (Signed)
 [facility] Patient Name: Patricia [name] Procedure Date: 05/05/2024 MRN: [id number] Attending MD: Estefana Keas DO, DO, [id number] Date of Birth: [birth year] CSN: [id number] Age: 73 Admit Type: Inpatient Procedure:                Upper GI endoscopy Indications:              Lower abdominal pain, Hematemesis Providers:                Estefana Keas DO, DO, Almarie Goo LPN, LPN,                            Felice Sar, Technician Referring MD:              Medicines:                See the Anesthesia note for documentation of the                            administered medications Complications:            No immediate complications. Estimated Blood Loss:     Estimated blood loss was minimal. Procedure:                Pre-Anesthesia Assessment:                           - ASA Grade Assessment: III - A patient with severe                            systemic disease.                           - The risks and benefits of the procedure and the                            sedation options and risks were discussed with the                            patient. All questions were answered and informed                            consent was obtained.                           After obtaining informed consent, the endoscope was                            passed under direct vision. Throughout the                            procedure, the patient's blood pressure, pulse, and                            oxygen saturations were monitored continuously. The                            [id number]-[id number] ([id number] Olympus endoscope was  introduced                            through the mouth, and advanced to the second part                            of duodenum. The upper GI endoscopy was                            accomplished without difficulty. The patient                            tolerated the procedure well. Scope In: Scope Out: Findings:      The examined esophagus was normal.       A 3 cm hiatal hernia was present.      A few small sessile polyps with no bleeding and no stigmata of recent       bleeding were found in the gastric body.      A single nodular lesion measuring small in diameter was found in the       gastric antrum.      Four non-bleeding cratered duodenal ulcers with pigmented material and       visible vessel were found in the second portion of the duodenum. The       largest lesion was 10 mm in largest dimension. Coagulation for bleeding       prevention using bipolar probe was successful to cratered ulcer with       visible vessel. Area was successfully injected with 10 mL PuraStat gel       for hemostasis. Impression:               - Normal esophagus.                           - 3 cm hiatal hernia.                           - A few gastric polyps.                           - A single lesion suspicious for aberrant pancreas                            was found in the stomach.                           - Non-bleeding duodenal ulcers with pigmented                            material. Treated with bipolar cautery. Injected.                           - No specimens collected. Moderate Sedation:      See the other procedure note for documentation of moderate sedation with       intraservice time. Recommendation:           - Return patient to hospital ward for ongoing care.                           -  Clear liquid diet today.                           - Continue present medications.                           - Use sucralfate  suspension 1 gram PO QID for 7                            days.                           - No ibuprofen , naproxen, or other non-steroidal                            anti-inflammatory drugs.                           - Observe patient's clinical course following                            today's procedure with therapeutic intervention. Procedure Code(s):        --- Professional ---                           [phone],  Esophagogastroduodenoscopy, flexible,                            transoral; with control of bleeding, any method Diagnosis Code(s):        --- Professional ---                           K44.9, Diaphragmatic hernia without obstruction or                            gangrene                           K31.7, Polyp of stomach and duodenum                           K31.89, Other diseases of stomach and duodenum                           K26.9, Duodenal ulcer, unspecified as acute or                            chronic, without hemorrhage or perforation                           R10.30, Lower abdominal pain, unspecified                           K92.0, Hematemesis CPT copyright 2022 [employer]. All rights reserved. The codes documented in this report are preliminary and upon coder review may  be revised to meet current compliance requirements. Dr Estefana Keas, DO Estefana Keas DO, DO 05/05/2024  12:20:27 PM Number of Addenda: 0

## 2024-05-05 NOTE — ED Notes (Signed)
 Patient transported to CT

## 2024-05-05 NOTE — Hospital Course (Signed)
 Patricia [name] is a 73 y.o. female with a history of hypertension, chronic venous insufficiency, GERD, NASH, ascending thoracic aortic aneurysm, hyperlipidemia.  Patient presented secondary to nausea, vomiting, abdominal pain, presyncope with history of hematemesis concerning for upper GI bleeding.  Gastroenterology consulted.

## 2024-05-05 NOTE — H&P (Signed)
 History and Physical    Patricia [name]:[id number] DOB: Mar 13, 1951 DOA: 05/04/2024  PCP: Patricia Kins, MD  Patient coming from: home  I have personally briefly reviewed patient's old medical records in [facility]  Chief Complaint:  n/v/ abdominal pain and presyncope   HPI: Patricia [name] is a 73 y.o. female with medical history significant of HTN, Chronic venous insufficiency, GERD, NASH, Aneursym of Aorta, HLD, obesity who presents to ED with abdominal pain , presyncope and episode of hematemesis. Patient also has interim history of cat bite for which she is currently completing antibiotic course.In addition to recent TKR on 7/10. Patient notes no recurrent  hematemesis since admission. She notes no use of nsaids prior to her recent surgery but does note she had abdominal pain prior to surgery. She was however taking Meloxicam post op she notes no current n/v/d/fever/chills.   ED Course:  Afeb, bp 116/81, rr 18, hr 98, EKG:nsr FOB + Wbc 9.9, hgb 10.4 down fform 15.2,plt 287 Na 139, K 3.8, glu 119, cr 0.68,  t bili1.5 Lipase 42   CTA/CTAB IMPRESSION: 1. 4.5 cm ascending thoracic aortic aneurysm, unchanged. No evidence of dissection. 2. No pulmonary embolism. 3. Sigmoid diverticulosis without evidence of diverticulitis. 4. Additional ancillary findings as above. Tx LR 500 cc   Review of Systems: As per HPI otherwise 10 point review of systems negative.   Past Medical History:  Diagnosis Date   Aneurysm of aorta (HCC)    Arthritis    Atrophic vaginitis    Cellulitis and abscess of right leg 05/07/2018   Depression    Diverticulitis    DYSPNEA 06/04/2009   Qualifier: Diagnosis of  By: Jude MD, Harden GAILS.     Edema of both legs    She takes Lasix a couple of times per week to decrease swelling.     Fatty liver    GERD (gastroesophageal reflux disease)    History of kidney stones    Hyperlipidemia    Hypertension    Keratosis    hands and lower  limbs   Lymphedema    legs   Memory difficulty    Obesity    Reflux    Shingles    Venous stasis    lower legs    Past Surgical History:  Procedure Laterality Date   ABDOMINAL HYSTERECTOMY     APPENDECTOMY     AUGMENTATION MAMMAPLASTY Bilateral    Basal and Squamous cell cancers excised     CESAREAN SECTION     X 2   CHOLECYSTECTOMY N/A 11/02/2023   Procedure: LAPAROSCOPIC CHOLECYSTECTOMY;  Surgeon: Curvin Deward MOULD, MD;  Location: [facility] OR;  Service: General;  Laterality: N/A;  ASSIST: RNFA   CYSTOSCOPY/RETROGRADE/URETEROSCOPY/STONE EXTRACTION WITH BASKET     removal couldnt blast   EYE SURGERY     bilateral cataract with lens implants   lipoma surgery     LIPOSUCTION     knees   TOTAL KNEE ARTHROPLASTY Left 09/26/2018   Procedure: TOTAL KNEE ARTHROPLASTY;  Surgeon: Ernie Cough, MD;  Location: [facility];  Service: Orthopedics;  Laterality: Left;  70 mins   tummy tuck       reports that she has quit smoking. Her smoking use included cigarettes. She has a 25 pack-year smoking history. She has never used smokeless tobacco. She reports current alcohol  use. She reports that she does not use drugs.  Allergies  Allergen Reactions   Ciprofloxacin  Other (See Comments)    Pt has  a descending heart aneurism and has been instructed not to take Cipro .    Clindamycin/Lincomycin Other (See Comments)    Stomach pain   Morphine Itching   Tetracyclines & Related Other (See Comments)    Stomach pain    Family History  Problem Relation Age of Onset   Uterine cancer Mother    Heart disease Mother    Diabetes Mother    Hypertension Mother    Hyperlipidemia Mother    Alzheimer's disease Father    Hypertension Father    Cancer Paternal Grandmother        Gallbladder cancer   Diverticulitis Paternal Grandmother        gallbladder   Liver cancer Paternal Grandfather    Diverticulitis Paternal Grandfather    Diverticulitis Maternal Grandmother    Breast cancer Cousin        Paternal 1st  cousin Age 53   Depression Sister    Bipolar disorder Sister    Prior to Admission medications   Medication Sig Start Date End Date Taking? Authorizing Provider  acetaminophen  (TYLENOL ) 650 MG CR tablet Take 650 mg by mouth every 8 (eight) hours as needed for pain.   Yes [provider]  ALPRAZolam  (XANAX ) 1 MG tablet Take 1 mg by mouth as needed (anxiety when flying). 02/23/21  Yes [provider]  amLODipine  (NORVASC ) 5 MG tablet Take 5 mg by mouth daily.   Yes [provider]  amoxicillin -clavulanate (AUGMENTIN ) 875-125 MG tablet Take 1 tablet by mouth 2 (two) times daily. 05/02/24  Yes [provider]  aspirin  EC 81 MG tablet Take 1 tablet (81 mg total) by mouth daily. Swallow whole. 08/24/22  Yes Jeffrie Oneil BROCKS, MD  desvenlafaxine (PRISTIQ) 50 MG 24 hr tablet Take 50-100 mg by mouth See admin instructions. Take 50 mg daily, may increase to 100 mg daily as needed for depression   Yes [provider]  HYDROcodone -acetaminophen  (NORCO/VICODIN) 5-325 MG tablet Take 1 tablet by mouth every 4 (four) hours as needed for severe pain (pain score 7-10) or moderate pain (pain score 4-6).   Yes [provider]  meloxicam (MOBIC) 15 MG tablet Take 15 mg by mouth daily.   Yes [provider]  methocarbamol  (ROBAXIN ) 500 MG tablet Take 500 mg by mouth every 6 (six) hours as needed for muscle spasms.   Yes [provider]  metoprolol  succinate (TOPROL -XL) 50 MG 24 hr tablet TAKE ONE TABLET BY MOUTH ONE TIME DAILY WITH OR IMMEDIATELY FOLLOWING A MEAL 12/22/23  Yes Jeffrie Oneil BROCKS, MD  ondansetron  (ZOFRAN -ODT) 4 MG disintegrating tablet TAKE 1 TABLET BY MOUTH EVERY 6 HOURS AS NEEDED FOR NAUSEA AND VOMITING   Yes [provider]  rosuvastatin  (CRESTOR ) 20 MG tablet TAKE ONE TABLET BY MOUTH ONE TIME DAILY 12/15/23  Yes Skains, Oneil BROCKS, MD  SENNA-TIME 8.6 MG tablet TAKE 2 TABLETS EVERY DAY BY ORAL ROUTE AT BEDTIME FOR 14 DAYS.   Yes [provider]  triamterene -hydrochlorothiazide  (MAXZIDE -25) 37.5-25 MG per tablet Take 1 tablet by mouth daily.     Yes [provider]  diclofenac (VOLTAREN) 75 MG EC tablet TAKE 1 TABLET TWICE A DAY BY ORAL ROUTE WITH MEAL(S).    [provider]  oxyCODONE  (ROXICODONE ) 5 MG immediate release tablet Take 1 tablet (5 mg total) by mouth every 6 (six) hours as needed for severe pain (pain score 7-10). Patient not taking: Reported on 05/05/2024 11/02/23   Curvin Deward MOULD, MD  pantoprazole  (PROTONIX )  40 MG tablet take 1 tablet by mouth 1 hour before eating twice a day until next office visit Patient not taking: Reported on 05/05/2024 12/26/23   Shelah Lamar RAMAN, MD    Physical Exam: Vitals:   05/04/24 2131 05/04/24 2145 05/04/24 2330  BP: 116/81 (!) 108/58 120/64  Pulse: 98 94 81  Resp: 18 17 15   Temp: 98.2 F (36.8 C)    TempSrc: Oral    SpO2: 98% 96% 96%    Constitutional: NAD, calm, comfortable Vitals:   05/04/24 2131 05/04/24 2145 05/04/24 2330  BP: 116/81 (!) 108/58 120/64  Pulse: 98 94 81  Resp: 18 17 15   Temp: 98.2 F (36.8 C)    TempSrc: Oral    SpO2: 98% 96% 96%   Eyes: PERRL, lids and conjunctivae normal ENMT: Mucous membranes are moist. Posterior pharynx clear of any exudate or lesions.Normal dentition.  Neck: normal, supple, no masses, no thyromegaly Respiratory: clear to auscultation bilaterally, no wheezing, no crackles. Normal respiratory effort. No accessory muscle use.  Cardiovascular: Regular rate and rhythm, no murmurs / rubs / gallops. Trace extremity edema. Abdomen: epigastric tenderness, no masses palpated. No hepatosplenomegaly. Bowel sounds positive.  Musculoskeletal: no clubbing / cyanosis. No joint deformity upper and lower extremities. Good ROM, no contractures. Normal muscle tone.  Skin: no rashes, lesions, ulcers. No induration Neurologic: CN grossly intact. Sensation intact, DTR normal. Strength 5/5 in all 4.  Psychiatric: Normal  judgment and insight. Alert and oriented x 3. Normal mood.    Labs on Admission: I have personally reviewed following labs and imaging studies  CBC: Recent Labs  Lab 05/04/24 2232  WBC 9.9  NEUTROABS 7.9*  HGB 10.4*  HCT 32.4*  MCV 96.1  PLT 287   Basic Metabolic Panel: Recent Labs  Lab 05/04/24 2232  NA 139  K 3.8  CL 105  CO2 26  GLUCOSE 119*  BUN 35*  CREATININE 0.69  CALCIUM  8.3*   GFR: CrCl cannot be calculated (Unknown ideal weight.). Liver Function Tests: Recent Labs  Lab 05/04/24 2232  AST 20  ALT 17  ALKPHOS 82  BILITOT 1.5*  PROT 5.6*  ALBUMIN 2.7*   Recent Labs  Lab 05/04/24 2232  LIPASE 42   No results for input(s): AMMONIA in the last 168 hours. Coagulation Profile: No results for input(s): INR, PROTIME in the last 168 hours. Cardiac Enzymes: No results for input(s): CKTOTAL, CKMB, CKMBINDEX, TROPONINI in the last 168 hours. BNP (last 3 results) No results for input(s): PROBNP in the last 8760 hours. HbA1C: No results for input(s): HGBA1C in the last 72 hours. CBG: No results for input(s): GLUCAP in the last 168 hours. Lipid Profile: No results for input(s): CHOL, HDL, LDLCALC, TRIG, CHOLHDL, LDLDIRECT in the last 72 hours. Thyroid  Function Tests: No results for input(s): TSH, T4TOTAL, FREET4, T3FREE, THYROIDAB in the last 72 hours. Anemia Panel: No results for input(s): VITAMINB12, FOLATE, FERRITIN, TIBC, IRON, RETICCTPCT in the last 72 hours. Urine analysis:    Component Value Date/Time   COLORURINE YELLOW 05/06/2018 1824   APPEARANCEUR HAZY (A) 05/06/2018 1824   LABSPEC 1.024 05/06/2018 1824   PHURINE 5.0 05/06/2018 1824   GLUCOSEU NEGATIVE 05/06/2018 1824   HGBUR MODERATE (A) 05/06/2018 1824   BILIRUBINUR NEGATIVE 05/06/2018 1824   KETONESUR NEGATIVE 05/06/2018 1824   PROTEINUR NEGATIVE 05/06/2018 1824   UROBILINOGEN 0.2 10/16/2013 1301   NITRITE NEGATIVE 05/06/2018 1824    LEUKOCYTESUR TRACE (A) 05/06/2018 1824    Radiological Exams on Admission: CT  Angio Chest Aorta W and/or Wo Contrast Result Date: 05/05/2024 EXAM: CTA CHEST PE WITHOUT AND WITH CONTRAST CT ABDOMEN AND PELVIS WITHOUT AND WITH CONTRAST 05/05/2024 12:08:37 AM TECHNIQUE: CTA of the chest was performed after the administration of intravenous contrast. Multiplanar reformatted images are provided for review. MIP images are provided for review. CT of the abdomen and pelvis was performed with the administration of intravenous contrast. Automated exposure control, iterative reconstruction, and/or weight based adjustment of the mA/kV was utilized to reduce the radiation dose to as low as reasonably achievable. COMPARISON: CT of chest dated 09/19/2023 and CT abdomen/pelvis dated 03/10/2017. CLINICAL HISTORY: Aortic aneurysm, presents with hematemesis. FINDINGS: CHEST: PULMONARY ARTERIES: Pulmonary arteries are adequately opacified for evaluation. No evidence of pulmonary embolism. MEDIASTINUM: 4.5 cm ascending thoracic aortic aneurysm, unchanged. No evidence of dissection. Thoracic aortic atherosclerosis. Mild coronary arthrosis of the LAD and right coronary artery. Tiny hiatal hernia. LUNGS AND PLEURA: Mild faint bilateral lower lobe atelectasis. No focal consolidation or pulmonary edema. No pleural effusion or pneumothorax. SOFT TISSUES AND BONES: Bilateral breast augmentation. No acute bone or soft tissue abnormality. ABDOMEN AND PELVIS: LIVER: 13 mm simple inferior left hepatic cyst, benign. GALLBLADDER AND BILE DUCTS: Gallbladder is unremarkable. No biliary ductal dilatation. SPLEEN: Spleen demonstrates no acute abnormality. PANCREAS: Unremarkable. ADRENAL GLANDS: Adrenal glands demonstrate no acute abnormality. KIDNEYS, URETERS AND BLADDER: Simple bilateral renal cysts, measuring up to 1.9 cm in the right upper kidney, benign (Bosniak 1). No follow up is recommended. Mild fullness of the left upper pole renal  moiety, unchanged, likely reflecting prior proximal ureteral stricture when correlating with prior studies. No stones in the kidneys or ureters. No hydronephrosis. No perinephric or periureteral stranding. Urinary bladder is unremarkable. GI AND BOWEL: Sigmoid diverticulosis, without evidence of diverticulitis. Large duodenal diverticulum. Appendix is not completely visualized. Stomach is within normal limits. There is no bowel obstruction. No abnormal bowel wall thickening or distension. REPRODUCTIVE: Status post hysterectomy. PERITONEUM AND RETROPERITONEUM: No ascites or free air. LYMPH NODES: No lymphadenopathy. BONES AND SOFT TISSUES: No acute abnormality of the visualized bones. No focal soft tissue abnormality. IMPRESSION: 1. 4.5 cm ascending thoracic aortic aneurysm, unchanged. No evidence of dissection. 2. No pulmonary embolism. 3. Sigmoid diverticulosis without evidence of diverticulitis. 4. Additional ancillary findings as above. Electronically signed by: Pinkie Pebbles MD 05/05/2024 12:18 AM EDT RP Workstation: [id number]   CT ABDOMEN PELVIS W CONTRAST Result Date: 05/05/2024 EXAM: CTA CHEST PE WITHOUT AND WITH CONTRAST CT ABDOMEN AND PELVIS WITHOUT AND WITH CONTRAST 05/05/2024 12:08:37 AM TECHNIQUE: CTA of the chest was performed after the administration of intravenous contrast. Multiplanar reformatted images are provided for review. MIP images are provided for review. CT of the abdomen and pelvis was performed with the administration of intravenous contrast. Automated exposure control, iterative reconstruction, and/or weight based adjustment of the mA/kV was utilized to reduce the radiation dose to as low as reasonably achievable. COMPARISON: CT of chest dated 09/19/2023 and CT abdomen/pelvis dated 03/10/2017. CLINICAL HISTORY: Aortic aneurysm, presents with hematemesis. FINDINGS: CHEST: PULMONARY ARTERIES: Pulmonary arteries are adequately opacified for evaluation. No evidence of pulmonary  embolism. MEDIASTINUM: 4.5 cm ascending thoracic aortic aneurysm, unchanged. No evidence of dissection. Thoracic aortic atherosclerosis. Mild coronary arthrosis of the LAD and right coronary artery. Tiny hiatal hernia. LUNGS AND PLEURA: Mild faint bilateral lower lobe atelectasis. No focal consolidation or pulmonary edema. No pleural effusion or pneumothorax. SOFT TISSUES AND BONES: Bilateral breast augmentation. No acute bone or soft tissue abnormality. ABDOMEN AND PELVIS: LIVER: 13  mm simple inferior left hepatic cyst, benign. GALLBLADDER AND BILE DUCTS: Gallbladder is unremarkable. No biliary ductal dilatation. SPLEEN: Spleen demonstrates no acute abnormality. PANCREAS: Unremarkable. ADRENAL GLANDS: Adrenal glands demonstrate no acute abnormality. KIDNEYS, URETERS AND BLADDER: Simple bilateral renal cysts, measuring up to 1.9 cm in the right upper kidney, benign (Bosniak 1). No follow up is recommended. Mild fullness of the left upper pole renal moiety, unchanged, likely reflecting prior proximal ureteral stricture when correlating with prior studies. No stones in the kidneys or ureters. No hydronephrosis. No perinephric or periureteral stranding. Urinary bladder is unremarkable. GI AND BOWEL: Sigmoid diverticulosis, without evidence of diverticulitis. Large duodenal diverticulum. Appendix is not completely visualized. Stomach is within normal limits. There is no bowel obstruction. No abnormal bowel wall thickening or distension. REPRODUCTIVE: Status post hysterectomy. PERITONEUM AND RETROPERITONEUM: No ascites or free air. LYMPH NODES: No lymphadenopathy. BONES AND SOFT TISSUES: No acute abnormality of the visualized bones. No focal soft tissue abnormality. IMPRESSION: 1. 4.5 cm ascending thoracic aortic aneurysm, unchanged. No evidence of dissection. 2. No pulmonary embolism. 3. Sigmoid diverticulosis without evidence of diverticulitis. 4. Additional ancillary findings as above. Electronically signed by:  Pinkie Pebbles MD 05/05/2024 12:18 AM EDT RP Workstation: [id number]    EKG: Independently reviewed.   Assessment/Plan   Upper Gi bleed with hematemesis -Admit to stepdown  -place on protonix   - cycle h/h transfuse if < 10( insetting of Hgb of 15 now 12 )  -GI consulted - NPO  -supportive antiemetic and ivfs    HTN  - holding anti-htn medication in setting of GI Bleed   GERD - ppi   NASH - no active issues     Chronic venous insufficiency -no active issue    GERD -ppi    Aneursym of Aorta -stable     HLD -continue statin    Obesity  DVT prophylaxis: scd Code Status: full/ as discussed per patient wishes in event of cardiac arrest  Family Communication: none at bedside Disposition Plan: full/ as discussed per patient wishes in event of cardiac arrest  Consults called: Gi Admission status: step down    Camila DELENA Ned MD [facility]   If 7PM-7AM, please contact night-coverage [email or web address] Password [email or web address]  05/05/2024, 1:28 AM

## 2024-05-05 NOTE — Transfer of Care (Signed)
 Immediate Anesthesia Transfer of Care Note  Patient: Patricia [name]  Procedure(s) Performed: EGD (ESOPHAGOGASTRODUODENOSCOPY)  Patient Location: PACU  Anesthesia Type:MAC  Level of Consciousness: drowsy and patient cooperative  Airway & Oxygen Therapy: Patient connected to nasal cannula oxygen  Post-op Assessment: Report given to RN and Post -op Vital signs reviewed and stable  Post vital signs: Reviewed and stable  Last Vitals:  Vitals Value Taken Time  BP 114/55 05/05/24 12:10  Temp 36.7 C 05/05/24 12:05  Pulse 69 05/05/24 12:11  Resp 18 05/05/24 12:11  SpO2 100 % 05/05/24 12:11  Vitals shown include unfiled device data.  Last Pain:  Vitals:   05/05/24 1205  TempSrc: Temporal  PainSc: 7          Complications: No notable events documented.

## 2024-05-05 NOTE — Consult Note (Signed)
 [facility] Gastroenterology Consult  Referring Provider: No ref. provider found Primary Care Physician:  Loreli Kins, MD Primary Gastroenterologist: [facility] Dianna  Reason for Consultation: Hematemesis  SUBJECTIVE:   HPI: Patricia [name] is a 73 y.o. female with past medical history significant for dysphagia, diverticulitis, GERD, hypertension.  Underwent orthopedic intervention of right knee on 04/26/2024.  Subsequent to this has been taking hydrocodone , Tylenol  and meloxicam.  On review of chart, do see encounter where she was prescribed diclofenac in June 2025 for right knee pain.  Began to have lower abdominal pain yesterday, improved with bowel movement.  After eating fast food yesterday, had episode of indigestion, hematemesis x 1.  Described as large volume and burgundy in color.  Presented to the emergency department.  Has not had further hematemesis.  Patient denied any current dysphagia.  She had significant anxiety overnight, no shortness of breath.  Dark brown stool, no melena.  EGD 12/09/2017 for dysphagia showed intrinsic stenosis at 36 cm status post 18 mm balloon dilation, small hiatal hernia.  Past Medical History:  Diagnosis Date   Aneurysm of aorta (HCC)    Arthritis    Atrophic vaginitis    Cellulitis and abscess of right leg 05/07/2018   Depression    Diverticulitis    DYSPNEA 06/04/2009   Qualifier: Diagnosis of  By: Jude MD, Harden GAILS.     Edema of both legs    She takes Lasix a couple of times per week to decrease swelling.     Fatty liver    GERD (gastroesophageal reflux disease)    History of kidney stones    Hyperlipidemia    Hypertension    Keratosis    hands and lower limbs   Lymphedema    legs   Memory difficulty    Obesity    Reflux    Shingles    Venous stasis    lower legs   Past Surgical History:  Procedure Laterality Date   ABDOMINAL HYSTERECTOMY     APPENDECTOMY     AUGMENTATION MAMMAPLASTY Bilateral    Basal and Squamous cell  cancers excised     CESAREAN SECTION     X 2   CHOLECYSTECTOMY N/A 11/02/2023   Procedure: LAPAROSCOPIC CHOLECYSTECTOMY;  Surgeon: Curvin Deward MOULD, MD;  Location: [facility] OR;  Service: General;  Laterality: N/A;  ASSIST: RNFA   CYSTOSCOPY/RETROGRADE/URETEROSCOPY/STONE EXTRACTION WITH BASKET     removal couldnt blast   EYE SURGERY     bilateral cataract with lens implants   lipoma surgery     LIPOSUCTION     knees   TOTAL KNEE ARTHROPLASTY Left 09/26/2018   Procedure: TOTAL KNEE ARTHROPLASTY;  Surgeon: Ernie Cough, MD;  Location: [facility] ORS;  Service: Orthopedics;  Laterality: Left;  70 mins   tummy tuck     Prior to Admission medications   Medication Sig Start Date End Date Taking? Authorizing Provider  acetaminophen  (TYLENOL ) 650 MG CR tablet Take 650 mg by mouth every 8 (eight) hours as needed for pain.   Yes [provider]  ALPRAZolam  (XANAX ) 1 MG tablet Take 1 mg by mouth as needed (anxiety when flying). 02/23/21  Yes [provider]  amLODipine  (NORVASC ) 5 MG tablet Take 5 mg by mouth daily.   Yes [provider]  amoxicillin -clavulanate (AUGMENTIN ) 875-125 MG tablet Take 1 tablet by mouth 2 (two) times daily. 05/02/24  Yes [provider]  aspirin  EC 81 MG tablet Take 1 tablet (81 mg total) by mouth daily.  Swallow whole. 08/24/22  Yes Jeffrie Oneil BROCKS, MD  desvenlafaxine (PRISTIQ) 50 MG 24 hr tablet Take 50-100 mg by mouth See admin instructions. Take 50 mg daily, may increase to 100 mg daily as needed for depression   Yes [provider]  HYDROcodone -acetaminophen  (NORCO/VICODIN) 5-325 MG tablet Take 1 tablet by mouth every 4 (four) hours as needed for severe pain (pain score 7-10) or moderate pain (pain score 4-6).   Yes [provider]  meloxicam (MOBIC) 15 MG tablet Take 15 mg by mouth daily.   Yes [provider]  methocarbamol  (ROBAXIN ) 500 MG tablet Take 500 mg by mouth every 6 (six) hours as needed for muscle spasms.   Yes  [provider]  metoprolol  succinate (TOPROL -XL) 50 MG 24 hr tablet TAKE ONE TABLET BY MOUTH ONE TIME DAILY WITH OR IMMEDIATELY FOLLOWING A MEAL 12/22/23  Yes Jeffrie Oneil BROCKS, MD  ondansetron  (ZOFRAN -ODT) 4 MG disintegrating tablet TAKE 1 TABLET BY MOUTH EVERY 6 HOURS AS NEEDED FOR NAUSEA AND VOMITING   Yes [provider]  rosuvastatin  (CRESTOR ) 20 MG tablet TAKE ONE TABLET BY MOUTH ONE TIME DAILY 12/15/23  Yes Skains, Oneil BROCKS, MD  SENNA-TIME 8.6 MG tablet TAKE 2 TABLETS EVERY DAY BY ORAL ROUTE AT BEDTIME FOR 14 DAYS.   Yes [provider]  triamterene -hydrochlorothiazide  (MAXZIDE -25) 37.5-25 MG per tablet Take 1 tablet by mouth daily.     Yes [provider]  diclofenac (VOLTAREN) 75 MG EC tablet TAKE 1 TABLET TWICE A DAY BY ORAL ROUTE WITH MEAL(S).    [provider]  oxyCODONE  (ROXICODONE ) 5 MG immediate release tablet Take 1 tablet (5 mg total) by mouth every 6 (six) hours as needed for severe pain (pain score 7-10). Patient not taking: Reported on 05/05/2024 11/02/23   Curvin Mt III, MD  pantoprazole  (PROTONIX ) 40 MG tablet take 1 tablet by mouth 1 hour before eating twice a day until next office visit Patient not taking: Reported on 05/05/2024 12/26/23   Byrum, Robert S, MD   Current Facility-Administered Medications  Medication Dose Route Frequency Provider Last Rate Last Admin   0.9 %  sodium chloride  infusion   Intravenous Continuous Debby Camila LABOR, MD 125 mL/hr at 05/05/24 0412 Infusion Verify at 05/05/24 0412   [MAR Hold] acetaminophen  (TYLENOL ) tablet 650 mg  650 mg Oral Q6H PRN Debby Camila LABOR, MD       Or   ILDA Hold] acetaminophen  (TYLENOL ) suppository 650 mg  650 mg Rectal Q6H PRN Thomas, Sara-Maiz A, MD       [MAR Hold] albuterol  (PROVENTIL ) (2.5 MG/3ML) 0.083% nebulizer solution 2.5 mg  2.5 mg Nebulization Q2H PRN Thomas, Sara-Maiz A, MD       [MAR Hold] ALPRAZolam  (XANAX ) tablet 1 mg  1 mg Oral PRN Thomas, Sara-Maiz A, MD        [MAR Hold] amoxicillin -clavulanate (AUGMENTIN ) 875-125 MG per tablet 1 tablet  1 tablet Oral BID Debby Camila A, MD   1 tablet at 05/05/24 0220   [MAR Hold] fentaNYL  (SUBLIMAZE ) injection 12.5-50 mcg  12.5-50 mcg Intravenous Q2H PRN Thomas, Sara-Maiz A, MD       [MAR Hold] HYDROcodone -acetaminophen  (NORCO/VICODIN) 5-325 MG per tablet 1 tablet  1 tablet Oral Q4H PRN Thomas, Sara-Maiz A, MD       [MAR Hold] lip balm (CARMEX) ointment   Topical PRN Briana Elgin LABOR, MD       [facility] Hold] metoprolol  succinate (TOPROL -XL) 24 hr tablet 50 mg  50 mg  Oral Daily Briana Elgin LABOR, MD       [facility] Hold] ondansetron  (ZOFRAN ) tablet 4 mg  4 mg Oral Q6H PRN Debby Camila LABOR, MD       Or   ILDA Hold] ondansetron  (ZOFRAN ) injection 4 mg  4 mg Intravenous Q6H PRN Debby Camila LABOR, MD       [MAR Hold] pantoprazole  (PROTONIX ) injection 80 mg  80 mg Intravenous Q12H Debby Camila A, MD   80 mg at 05/05/24 0222   [MAR Hold] venlafaxine  XR (EFFEXOR -XR) 24 hr capsule 75 mg  75 mg Oral Q breakfast Debby Camila LABOR, MD       Allergies as of 05/04/2024 - Review Complete 05/04/2024  Allergen Reaction Noted   Ciprofloxacin  Other (See Comments) 05/29/2018   Clindamycin/lincomycin Other (See Comments) 05/06/2018   Morphine Itching    Tetracyclines & related Other (See Comments) 10/10/2012   Family History  Problem Relation Age of Onset   Uterine cancer Mother    Heart disease Mother    Diabetes Mother    Hypertension Mother    Hyperlipidemia Mother    Alzheimer's disease Father    Hypertension Father    Cancer Paternal Grandmother        Gallbladder cancer   Diverticulitis Paternal Grandmother        gallbladder   Liver cancer Paternal Grandfather    Diverticulitis Paternal Grandfather    Diverticulitis Maternal Grandmother    Breast cancer Cousin        Paternal 1st cousin Age 39   Depression Sister    Bipolar disorder Sister    Social History   Socioeconomic History   Marital status:  Married    Spouse name: [name]   Number of children: 2   Years of education: 12   Highest education level: Not on file  Occupational History   Occupation: Product manager: [employer]    Comment: SELF-EMPLOYED   Tobacco Use   Smoking status: Former    Current packs/day: 1.00    Average packs/day: 1 pack/day for 25.0 years (25.0 ttl pk-yrs)    Types: Cigarettes   Smokeless tobacco: Never  Vaping Use   Vaping status: Never Used  Substance and Sexual Activity   Alcohol  use: Yes    Alcohol /week: 0.0 standard drinks of alcohol     Comment: Rare   Drug use: No   Sexual activity: Yes    Birth control/protection: Surgical    Comment: HYST-1st intercourse 73 yo-Fewer than 5 partners  Other Topics Concern   Not on file  Social History Narrative   Marital Status: Married Pharmacist, hospital)   Children:  Sons (2)    Pets: Cat    Living Situation: Lives with husband    Occupation: Development worker, international aid - [employer]   Education: Engineer, agricultural    Tobacco Use/Exposure:  None    Alcohol  Use:  Occasional   Drug Use:  None   Diet:  Regular   Exercise:  Bikram Yoga    Hobbies: Gardening/ Reading   Patient is right handed               Social Drivers of Corporate investment banker Strain: Not on file  Food Insecurity: No Food Insecurity (05/05/2024)   Hunger Vital Sign    Worried About Running Out of Food in the Last Year: Never true    Ran Out of Food in the Last Year: Never true  Transportation Needs: No  Transportation Needs (05/05/2024)   PRAPARE - Administrator, Civil Service (Medical): No    Lack of Transportation (Non-Medical): No  Physical Activity: Not on file  Stress: Not on file  Social Connections: Moderately Integrated (05/05/2024)   Social Connection and Isolation Panel    Frequency of Communication with Friends and Family: More than three times a week    Frequency of Social Gatherings with Friends and Family: More than  three times a week    Attends Religious Services: More than 4 times per year    Active Member of [employer] or Organizations: No    Attends Banker Meetings: Never    Marital Status: Married  Catering manager Violence: Not At Risk (05/05/2024)   Humiliation, Afraid, Rape, and Kick questionnaire    Fear of Current or Ex-Partner: No    Emotionally Abused: No    Physically Abused: No    Sexually Abused: No   Review of Systems:  Review of Systems  Respiratory:  Negative for shortness of breath.   Cardiovascular:  Negative for chest pain.  Gastrointestinal:  Positive for abdominal pain, constipation, diarrhea, nausea and vomiting.    OBJECTIVE:   Temp:  [97.7 F (36.5 C)-98.9 F (37.2 C)] 97.7 F (36.5 C) (07/19 1029) Pulse Rate:  [81-98] 90 (07/19 1029) Resp:  [15-18] 16 (07/19 1029) BP: (108-128)/(58-81) 128/65 (07/19 1029) SpO2:  [94 %-100 %] 94 % (07/19 1029) Weight:  [72.6 kg] 72.6 kg (07/19 0306)   Physical Exam Constitutional:      General: She is not in acute distress.    Appearance: She is not ill-appearing, toxic-appearing or diaphoretic.  Cardiovascular:     Rate and Rhythm: Normal rate and regular rhythm.  Pulmonary:     Effort: No respiratory distress.     Breath sounds: Normal breath sounds.  Abdominal:     General: Bowel sounds are normal. There is no distension.     Palpations: Abdomen is soft.     Tenderness: There is abdominal tenderness. There is no guarding.  Neurological:     Mental Status: She is alert.     Labs: Recent Labs    05/04/24 2232 05/05/24 0410 05/05/24 0708  WBC 9.9 7.7  --   HGB 10.4* 10.0* 9.5*  HCT 32.4* 31.9* 30.2*  PLT 287 296  --    BMET Recent Labs    05/04/24 2232 05/05/24 0410  NA 139 138  K 3.8 3.5  CL 105 105  CO2 26 23  GLUCOSE 119* 98  BUN 35* 32*  CREATININE 0.69 0.66  CALCIUM  8.3* 8.3*   LFT Recent Labs    05/05/24 0410  PROT 5.2*  ALBUMIN 2.6*  AST 17  ALT 13  ALKPHOS 69  BILITOT 1.4*    PT/INR Recent Labs    05/05/24 0708  LABPROT 14.3  INR 1.1    Diagnostic imaging: CT Angio Chest Aorta W and/or Wo Contrast Result Date: 05/05/2024 EXAM: CTA CHEST PE WITHOUT AND WITH CONTRAST CT ABDOMEN AND PELVIS WITHOUT AND WITH CONTRAST 05/05/2024 12:08:37 AM TECHNIQUE: CTA of the chest was performed after the administration of intravenous contrast. Multiplanar reformatted images are provided for review. MIP images are provided for review. CT of the abdomen and pelvis was performed with the administration of intravenous contrast. Automated exposure control, iterative reconstruction, and/or weight based adjustment of the mA/kV was utilized to reduce the radiation dose to as low as reasonably achievable. COMPARISON: CT of chest dated 09/19/2023 and CT  abdomen/pelvis dated 03/10/2017. CLINICAL HISTORY: Aortic aneurysm, presents with hematemesis. FINDINGS: CHEST: PULMONARY ARTERIES: Pulmonary arteries are adequately opacified for evaluation. No evidence of pulmonary embolism. MEDIASTINUM: 4.5 cm ascending thoracic aortic aneurysm, unchanged. No evidence of dissection. Thoracic aortic atherosclerosis. Mild coronary arthrosis of the LAD and right coronary artery. Tiny hiatal hernia. LUNGS AND PLEURA: Mild faint bilateral lower lobe atelectasis. No focal consolidation or pulmonary edema. No pleural effusion or pneumothorax. SOFT TISSUES AND BONES: Bilateral breast augmentation. No acute bone or soft tissue abnormality. ABDOMEN AND PELVIS: LIVER: 13 mm simple inferior left hepatic cyst, benign. GALLBLADDER AND BILE DUCTS: Gallbladder is unremarkable. No biliary ductal dilatation. SPLEEN: Spleen demonstrates no acute abnormality. PANCREAS: Unremarkable. ADRENAL GLANDS: Adrenal glands demonstrate no acute abnormality. KIDNEYS, URETERS AND BLADDER: Simple bilateral renal cysts, measuring up to 1.9 cm in the right upper kidney, benign (Bosniak 1). No follow up is recommended. Mild fullness of the left upper  pole renal moiety, unchanged, likely reflecting prior proximal ureteral stricture when correlating with prior studies. No stones in the kidneys or ureters. No hydronephrosis. No perinephric or periureteral stranding. Urinary bladder is unremarkable. GI AND BOWEL: Sigmoid diverticulosis, without evidence of diverticulitis. Large duodenal diverticulum. Appendix is not completely visualized. Stomach is within normal limits. There is no bowel obstruction. No abnormal bowel wall thickening or distension. REPRODUCTIVE: Status post hysterectomy. PERITONEUM AND RETROPERITONEUM: No ascites or free air. LYMPH NODES: No lymphadenopathy. BONES AND SOFT TISSUES: No acute abnormality of the visualized bones. No focal soft tissue abnormality. IMPRESSION: 1. 4.5 cm ascending thoracic aortic aneurysm, unchanged. No evidence of dissection. 2. No pulmonary embolism. 3. Sigmoid diverticulosis without evidence of diverticulitis. 4. Additional ancillary findings as above. Electronically signed by: Pinkie Pebbles MD 05/05/2024 12:18 AM EDT RP Workstation: [id number]   CT ABDOMEN PELVIS W CONTRAST Result Date: 05/05/2024 EXAM: CTA CHEST PE WITHOUT AND WITH CONTRAST CT ABDOMEN AND PELVIS WITHOUT AND WITH CONTRAST 05/05/2024 12:08:37 AM TECHNIQUE: CTA of the chest was performed after the administration of intravenous contrast. Multiplanar reformatted images are provided for review. MIP images are provided for review. CT of the abdomen and pelvis was performed with the administration of intravenous contrast. Automated exposure control, iterative reconstruction, and/or weight based adjustment of the mA/kV was utilized to reduce the radiation dose to as low as reasonably achievable. COMPARISON: CT of chest dated 09/19/2023 and CT abdomen/pelvis dated 03/10/2017. CLINICAL HISTORY: Aortic aneurysm, presents with hematemesis. FINDINGS: CHEST: PULMONARY ARTERIES: Pulmonary arteries are adequately opacified for evaluation. No evidence of  pulmonary embolism. MEDIASTINUM: 4.5 cm ascending thoracic aortic aneurysm, unchanged. No evidence of dissection. Thoracic aortic atherosclerosis. Mild coronary arthrosis of the LAD and right coronary artery. Tiny hiatal hernia. LUNGS AND PLEURA: Mild faint bilateral lower lobe atelectasis. No focal consolidation or pulmonary edema. No pleural effusion or pneumothorax. SOFT TISSUES AND BONES: Bilateral breast augmentation. No acute bone or soft tissue abnormality. ABDOMEN AND PELVIS: LIVER: 13 mm simple inferior left hepatic cyst, benign. GALLBLADDER AND BILE DUCTS: Gallbladder is unremarkable. No biliary ductal dilatation. SPLEEN: Spleen demonstrates no acute abnormality. PANCREAS: Unremarkable. ADRENAL GLANDS: Adrenal glands demonstrate no acute abnormality. KIDNEYS, URETERS AND BLADDER: Simple bilateral renal cysts, measuring up to 1.9 cm in the right upper kidney, benign (Bosniak 1). No follow up is recommended. Mild fullness of the left upper pole renal moiety, unchanged, likely reflecting prior proximal ureteral stricture when correlating with prior studies. No stones in the kidneys or ureters. No hydronephrosis. No perinephric or periureteral stranding. Urinary bladder is unremarkable. GI AND BOWEL:  Sigmoid diverticulosis, without evidence of diverticulitis. Large duodenal diverticulum. Appendix is not completely visualized. Stomach is within normal limits. There is no bowel obstruction. No abnormal bowel wall thickening or distension. REPRODUCTIVE: Status post hysterectomy. PERITONEUM AND RETROPERITONEUM: No ascites or free air. LYMPH NODES: No lymphadenopathy. BONES AND SOFT TISSUES: No acute abnormality of the visualized bones. No focal soft tissue abnormality. IMPRESSION: 1. 4.5 cm ascending thoracic aortic aneurysm, unchanged. No evidence of dissection. 2. No pulmonary embolism. 3. Sigmoid diverticulosis without evidence of diverticulitis. 4. Additional ancillary findings as above. Electronically signed  by: Pinkie Pebbles MD 05/05/2024 12:18 AM EDT RP Workstation: [id number]   IMPRESSION: Hematemesis NSAID use Acute blood loss anemia History of dysphagia Fecal occult positive stool Sigmoid diverticulosis  PLAN: -Recommend EGD to further evaluate hematemesis, discussed this with patient including benefits, alternatives and risks of bleeding/infection/perforation/missed lesion/anesthesia, she verbalized understanding elected proceed -Maintain n.p.o. for procedure -IV PPI every 12 hours -Trend H/H -Further recommendations to follow pending procedure   LOS: 0 days   Estefana Keas, DO [facility] Gastroenterology

## 2024-05-05 NOTE — Anesthesia Preprocedure Evaluation (Signed)
 Anesthesia Evaluation  Patient identified by MRN, date of birth, ID band Patient awake    Reviewed: Allergy & Precautions, H&P , NPO status , Patient's Chart, lab work & pertinent test results  Airway Mallampati: II   Neck ROM: full    Dental   Pulmonary former smoker   breath sounds clear to auscultation       Cardiovascular hypertension,  Rhythm:regular Rate:Normal     Neuro/Psych  PSYCHIATRIC DISORDERS  Depression       GI/Hepatic ,GERD  ,,hematemesis   Endo/Other    Renal/GU stones     Musculoskeletal  (+) Arthritis ,    Abdominal   Peds  Hematology  (+) Blood dyscrasia, anemia Hemoglobin 9.5   Anesthesia Other Findings   Reproductive/Obstetrics                              Anesthesia Physical Anesthesia Plan  ASA: 3  Anesthesia Plan: MAC   Post-op Pain Management:    Induction: Intravenous  PONV Risk Score and Plan: 2 and Propofol  infusion and Treatment may vary due to age or medical condition  Airway Management Planned:   Additional Equipment:   Intra-op Plan:   Post-operative Plan:   Informed Consent: I have reviewed the patients History and Physical, chart, labs and discussed the procedure including the risks, benefits and alternatives for the proposed anesthesia with the patient or authorized representative who has indicated his/her understanding and acceptance.     Dental advisory given  Plan Discussed with: CRNA, Anesthesiologist and Surgeon  Anesthesia Plan Comments:         Anesthesia Quick Evaluation

## 2024-05-05 NOTE — Progress Notes (Addendum)
 PROGRESS NOTE    Patricia [name]  FMW:[id number] DOB: [birth year] DOA: 05/04/2024 PCP: Loreli Kins, MD   Brief Narrative: Patricia [name] is a 73 y.o. female with a history of hypertension, chronic venous insufficiency, GERD, NASH, ascending thoracic aortic aneurysm, hyperlipidemia.  Patient presented secondary to nausea, vomiting, abdominal pain, presyncope with history of hematemesis concerning for upper GI bleeding.  Gastroenterology consulted.   Assessment and Plan:  Hematemesis Concerning for an upper GI bleed. Patient is on Mobic as an outpatient. No prior history of bleeding. 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 Patient started on Protonix . GI consulted. -GI recommendations: upper GI endoscopy today  Acute anemia Likely blood loss anemia from hematemesis. -Trend CBC/H&H  Primary hypertension Patient is on amlodipine , Maxide and metoprolol  as an outpatient, which were held on admission. -Resume metoprolol  to avoid reflex tachycardia  Nausea/vomiting Appears to be stable at this time. No episodes this morning or overnight.  GERD Patient is on Protonix  as an outpatient. -Continue Protonix   NASH Noted. Mildly elevated bilirubin. INR of 1.1. No history of ascites.  Chronic venous insufficiency Noted.  Ascending thoracic aortic aneurysm Stable based on CT read.  Hyperlipidemia Patient is on Crestor  as an outpatient which was held on admission.  Depression -Continue Effexor   Recent right knee replacement Noted.  Overweight Current weight does not meet criteria for obesity.    DVT prophylaxis: SCDs Code Status:   Code Status: Full Code Family Communication: Husband at bedside Disposition Plan: Discharge pending GI recommendations/management, stable hemoglobin   Consultants:  [facility]  Procedures:  None  Antimicrobials: None    Subjective: Patient reports no  recurrent emesis. Some lower abdominal pain.  Objective: BP 128/65   Pulse 90   Temp 97.7 F (36.5 C) (Temporal)   Resp 16   Ht 5' 4 (1.626 m)   Wt 72.6 kg   SpO2 94%   BMI 27.46 kg/m   Examination:  General exam: Appears calm and comfortable Respiratory system: Clear to auscultation. Respiratory effort normal. Cardiovascular system: S1 & S2 heard, RRR. No murmurs, rubs, gallops or clicks. Gastrointestinal system: Abdomen is nondistended, soft and mildly tender in lower quadrant. Normal bowel sounds heard. Central nervous system: Alert and oriented. No focal neurological deficits. Psychiatry: Judgement and insight appear normal. Mood & affect appropriate.    Data Reviewed: I have personally reviewed following labs and imaging studies  CBC Lab Results  Component Value Date   WBC 7.7 05/05/2024   RBC 3.29 (L) 05/05/2024   HGB 9.5 (L) 05/05/2024   HCT 30.2 (L) 05/05/2024   MCV 97.0 05/05/2024   MCH 30.4 05/05/2024   PLT 296 05/05/2024   MCHC 31.3 05/05/2024   RDW 13.5 05/05/2024   LYMPHSABS 0.9 05/04/2024   MONOABS 0.6 05/04/2024   EOSABS 0.3 05/04/2024   BASOSABS 0.1 05/04/2024     Last metabolic panel Lab Results  Component Value Date   NA 138 05/05/2024   K 3.5 05/05/2024   CL 105 05/05/2024   CO2 23 05/05/2024   BUN 32 (H) 05/05/2024   CREATININE 0.66 05/05/2024   GLUCOSE 98 05/05/2024   GFRNONAA >60 05/05/2024   GFRAA 105 05/16/2020   CALCIUM  8.3 (L) 05/05/2024   PROT 5.2 (L) 05/05/2024   ALBUMIN 2.6 (L) 05/05/2024   BILITOT 1.4 (H) 05/05/2024   ALKPHOS 69 05/05/2024   AST 17 05/05/2024   ALT 13 05/05/2024   ANIONGAP 10 05/05/2024  GFR: Estimated Creatinine Clearance: 60.3 mL/min (by C-G formula based on SCr of 0.66 mg/dL).  No results found for this or any previous visit (from the past 240 hours).    Radiology Studies: CT Angio Chest Aorta W and/or Wo Contrast Result Date: 05/05/2024 EXAM: CTA CHEST PE WITHOUT AND WITH CONTRAST CT  ABDOMEN AND PELVIS WITHOUT AND WITH CONTRAST 05/05/2024 12:08:37 AM TECHNIQUE: CTA of the chest was performed after the administration of intravenous contrast. Multiplanar reformatted images are provided for review. MIP images are provided for review. CT of the abdomen and pelvis was performed with the administration of intravenous contrast. Automated exposure control, iterative reconstruction, and/or weight based adjustment of the mA/kV was utilized to reduce the radiation dose to as low as reasonably achievable. COMPARISON: CT of chest dated 09/19/2023 and CT abdomen/pelvis dated 03/10/2017. CLINICAL HISTORY: Aortic aneurysm, presents with hematemesis. FINDINGS: CHEST: PULMONARY ARTERIES: Pulmonary arteries are adequately opacified for evaluation. No evidence of pulmonary embolism. MEDIASTINUM: 4.5 cm ascending thoracic aortic aneurysm, unchanged. No evidence of dissection. Thoracic aortic atherosclerosis. Mild coronary arthrosis of the LAD and right coronary artery. Tiny hiatal hernia. LUNGS AND PLEURA: Mild faint bilateral lower lobe atelectasis. No focal consolidation or pulmonary edema. No pleural effusion or pneumothorax. SOFT TISSUES AND BONES: Bilateral breast augmentation. No acute bone or soft tissue abnormality. ABDOMEN AND PELVIS: LIVER: 13 mm simple inferior left hepatic cyst, benign. GALLBLADDER AND BILE DUCTS: Gallbladder is unremarkable. No biliary ductal dilatation. SPLEEN: Spleen demonstrates no acute abnormality. PANCREAS: Unremarkable. ADRENAL GLANDS: Adrenal glands demonstrate no acute abnormality. KIDNEYS, URETERS AND BLADDER: Simple bilateral renal cysts, measuring up to 1.9 cm in the right upper kidney, benign (Bosniak 1). No follow up is recommended. Mild fullness of the left upper pole renal moiety, unchanged, likely reflecting prior proximal ureteral stricture when correlating with prior studies. No stones in the kidneys or ureters. No hydronephrosis. No perinephric or periureteral  stranding. Urinary bladder is unremarkable. GI AND BOWEL: Sigmoid diverticulosis, without evidence of diverticulitis. Large duodenal diverticulum. Appendix is not completely visualized. Stomach is within normal limits. There is no bowel obstruction. No abnormal bowel wall thickening or distension. REPRODUCTIVE: Status post hysterectomy. PERITONEUM AND RETROPERITONEUM: No ascites or free air. LYMPH NODES: No lymphadenopathy. BONES AND SOFT TISSUES: No acute abnormality of the visualized bones. No focal soft tissue abnormality. IMPRESSION: 1. 4.5 cm ascending thoracic aortic aneurysm, unchanged. No evidence of dissection. 2. No pulmonary embolism. 3. Sigmoid diverticulosis without evidence of diverticulitis. 4. Additional ancillary findings as above. Electronically signed by: Pinkie Pebbles MD 05/05/2024 12:18 AM EDT RP Workstation: [id number]   CT ABDOMEN PELVIS W CONTRAST Result Date: 05/05/2024 EXAM: CTA CHEST PE WITHOUT AND WITH CONTRAST CT ABDOMEN AND PELVIS WITHOUT AND WITH CONTRAST 05/05/2024 12:08:37 AM TECHNIQUE: CTA of the chest was performed after the administration of intravenous contrast. Multiplanar reformatted images are provided for review. MIP images are provided for review. CT of the abdomen and pelvis was performed with the administration of intravenous contrast. Automated exposure control, iterative reconstruction, and/or weight based adjustment of the mA/kV was utilized to reduce the radiation dose to as low as reasonably achievable. COMPARISON: CT of chest dated 09/19/2023 and CT abdomen/pelvis dated 03/10/2017. CLINICAL HISTORY: Aortic aneurysm, presents with hematemesis. FINDINGS: CHEST: PULMONARY ARTERIES: Pulmonary arteries are adequately opacified for evaluation. No evidence of pulmonary embolism. MEDIASTINUM: 4.5 cm ascending thoracic aortic aneurysm, unchanged. No evidence of dissection. Thoracic aortic atherosclerosis. Mild coronary arthrosis of the LAD and right coronary artery.  Tiny hiatal hernia. LUNGS AND  PLEURA: Mild faint bilateral lower lobe atelectasis. No focal consolidation or pulmonary edema. No pleural effusion or pneumothorax. SOFT TISSUES AND BONES: Bilateral breast augmentation. No acute bone or soft tissue abnormality. ABDOMEN AND PELVIS: LIVER: 13 mm simple inferior left hepatic cyst, benign. GALLBLADDER AND BILE DUCTS: Gallbladder is unremarkable. No biliary ductal dilatation. SPLEEN: Spleen demonstrates no acute abnormality. PANCREAS: Unremarkable. ADRENAL GLANDS: Adrenal glands demonstrate no acute abnormality. KIDNEYS, URETERS AND BLADDER: Simple bilateral renal cysts, measuring up to 1.9 cm in the right upper kidney, benign (Bosniak 1). No follow up is recommended. Mild fullness of the left upper pole renal moiety, unchanged, likely reflecting prior proximal ureteral stricture when correlating with prior studies. No stones in the kidneys or ureters. No hydronephrosis. No perinephric or periureteral stranding. Urinary bladder is unremarkable. GI AND BOWEL: Sigmoid diverticulosis, without evidence of diverticulitis. Large duodenal diverticulum. Appendix is not completely visualized. Stomach is within normal limits. There is no bowel obstruction. No abnormal bowel wall thickening or distension. REPRODUCTIVE: Status post hysterectomy. PERITONEUM AND RETROPERITONEUM: No ascites or free air. LYMPH NODES: No lymphadenopathy. BONES AND SOFT TISSUES: No acute abnormality of the visualized bones. No focal soft tissue abnormality. IMPRESSION: 1. 4.5 cm ascending thoracic aortic aneurysm, unchanged. No evidence of dissection. 2. No pulmonary embolism. 3. Sigmoid diverticulosis without evidence of diverticulitis. 4. Additional ancillary findings as above. Electronically signed by: Pinkie Pebbles MD 05/05/2024 12:18 AM EDT RP Workstation: [id number]      LOS: 0 days    Elgin Lam, MD [facility] 05/05/2024, 11:33 AM   If 7PM-7AM, please contact  night-coverage [email or web address]

## 2024-05-06 DIAGNOSIS — K922 Gastrointestinal hemorrhage, unspecified: Secondary | ICD-10-CM | POA: Diagnosis not present

## 2024-05-06 LAB — HEMOGLOBIN AND HEMATOCRIT, BLOOD
HCT: 26.3 % — ABNORMAL LOW (ref 36.0–46.0)
Hemoglobin: 8 g/dL — ABNORMAL LOW (ref 12.0–15.0)

## 2024-05-06 LAB — CBC
HCT: 24.3 % — ABNORMAL LOW (ref 36.0–46.0)
Hemoglobin: 7.7 g/dL — ABNORMAL LOW (ref 12.0–15.0)
MCH: 31 pg (ref 26.0–34.0)
MCHC: 31.7 g/dL (ref 30.0–36.0)
MCV: 98 fL (ref 80.0–100.0)
Platelets: 217 K/uL (ref 150–400)
RBC: 2.48 MIL/uL — ABNORMAL LOW (ref 3.87–5.11)
RDW: 14 % (ref 11.5–15.5)
WBC: 5.3 K/uL (ref 4.0–10.5)
nRBC: 0 % (ref 0.0–0.2)

## 2024-05-06 MED ORDER — LACTATED RINGERS IV SOLN: INTRAVENOUS | Status: DC

## 2024-05-06 MED ORDER — SODIUM CHLORIDE 0.9 % IV BOLUS
500.0000 mL | Freq: Once | INTRAVENOUS | Status: AC
  Administered 2024-05-06: 500 mL via INTRAVENOUS

## 2024-05-06 NOTE — Progress Notes (Signed)
 MD notified that patient's BP 84/50, MAP 60, HR 69. Patient was started on IV lactated ringers  fluids at 1154 going at 75 mL/hour. MD put in new orders to check a Hemoglobin and Hematocrit STAT.

## 2024-05-06 NOTE — Progress Notes (Signed)
 MD notified that RN will hold patient's scheduled metoprolol  due to patient's BP 90/51, MAP 64, and heart rate 68.

## 2024-05-06 NOTE — Progress Notes (Signed)
 PROGRESS NOTE    Patricia [name]  FMW:[id number] DOB: 13-May-1951 DOA: 05/04/2024 PCP: Loreli Kins, MD   Brief Narrative: Patricia [name] is a 73 y.o. female with a history of hypertension, chronic venous insufficiency, GERD, NASH, ascending thoracic aortic aneurysm, hyperlipidemia.  Patient presented secondary to nausea, vomiting, abdominal pain, presyncope with history of hematemesis concerning for upper GI bleeding.  Gastroenterology consulted.   Assessment and Plan:  Hematemesis Concerning for an upper GI bleed. Patient is on Mobic as an outpatient. No prior history of bleeding. 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 Patient started on Protonix . GI consulted and performed an upper GI endoscopy revealing non-bleeding duodenal ulcers with pigmented material, treated with bipolar cautery. No recurrence to date. -GI recommendations: Advance diet, sucralfate  suspension x7 days, Protonix , no NSAIDS  Acute anemia Likely blood loss anemia from hematemesis. Recent baseline hemoglobin of around 15. Hemoglobin of 10.4 on admission. Hemoglobin drifted down to 7.7 today. No obvious signs of continued bleeding. -Trend CBC/H&H  Primary hypertension Patient is on amlodipine , Maxide and metoprolol  as an outpatient, which were held on admission. -Resume metoprolol  to avoid reflex tachycardia  Nausea/vomiting Appears to be stable at this time. No episodes this morning or overnight.  GERD Patient is on Protonix  as an outpatient. -Continue Protonix   NASH Noted. Mildly elevated bilirubin. INR of 1.1. No history of ascites.  Chronic venous insufficiency Noted.  Ascending thoracic aortic aneurysm Stable based on CT read.  Hyperlipidemia Patient is on Crestor  as an outpatient which was held on admission.  Depression -Continue Effexor   Recent right knee replacement Noted.  Overweight Current weight does not meet criteria  for obesity.    DVT prophylaxis: SCDs Code Status:   Code Status: Full Code Family Communication: Husband at bedside Disposition Plan: Discharge home likely in 24 hours if hemoglobin remains stable and ongoing GI recommendations   Consultants:  [facility]  Procedures:  Upper GI endoscopy  Antimicrobials: None    Subjective: No bowel movement overnight. No hematemesis.  Objective: BP (!) 98/57   Pulse 70   Temp 97.8 F (36.6 C) (Oral)   Resp 17   Ht 5' 4 (1.626 m)   Wt 72.6 kg   SpO2 94%   BMI 27.46 kg/m   Examination:  General exam: Appears calm and comfortable Respiratory system: Clear to auscultation. Respiratory effort normal. Cardiovascular system: S1 & S2 heard, RRR. 1/6 systolic murmur Gastrointestinal system: Abdomen is nondistended, soft and nontender. Normal bowel sounds heard. Central nervous system: Alert and oriented. No focal neurological deficits. Psychiatry: Judgement and insight appear normal. Mood & affect appropriate.    Data Reviewed: I have personally reviewed following labs and imaging studies  CBC Lab Results  Component Value Date   WBC 5.3 05/06/2024   RBC 2.48 (L) 05/06/2024   HGB 7.7 (L) 05/06/2024   HCT 24.3 (L) 05/06/2024   MCV 98.0 05/06/2024   MCH 31.0 05/06/2024   PLT 217 05/06/2024   MCHC 31.7 05/06/2024   RDW 14.0 05/06/2024   LYMPHSABS 0.9 05/04/2024   MONOABS 0.6 05/04/2024   EOSABS 0.3 05/04/2024   BASOSABS 0.1 05/04/2024     Last metabolic panel Lab Results  Component Value Date   NA 138 05/05/2024   K 3.5 05/05/2024   CL 105 05/05/2024   CO2 23 05/05/2024   BUN 32 (H) 05/05/2024   CREATININE 0.66 05/05/2024   GLUCOSE 98 05/05/2024   GFRNONAA >60 05/05/2024  GFRAA 105 05/16/2020   CALCIUM  8.3 (L) 05/05/2024   PROT 5.2 (L) 05/05/2024   ALBUMIN 2.6 (L) 05/05/2024   BILITOT 1.4 (H) 05/05/2024   ALKPHOS 69 05/05/2024   AST 17 05/05/2024   ALT 13 05/05/2024   ANIONGAP 10 05/05/2024     GFR: Estimated Creatinine Clearance: 60.3 mL/min (by C-G formula based on SCr of 0.66 mg/dL).  No results found for this or any previous visit (from the past 240 hours).    Radiology Studies: CT Angio Chest Aorta W and/or Wo Contrast Result Date: 05/05/2024 EXAM: CTA CHEST PE WITHOUT AND WITH CONTRAST CT ABDOMEN AND PELVIS WITHOUT AND WITH CONTRAST 05/05/2024 12:08:37 AM TECHNIQUE: CTA of the chest was performed after the administration of intravenous contrast. Multiplanar reformatted images are provided for review. MIP images are provided for review. CT of the abdomen and pelvis was performed with the administration of intravenous contrast. Automated exposure control, iterative reconstruction, and/or weight based adjustment of the mA/kV was utilized to reduce the radiation dose to as low as reasonably achievable. COMPARISON: CT of chest dated 09/19/2023 and CT abdomen/pelvis dated 03/10/2017. CLINICAL HISTORY: Aortic aneurysm, presents with hematemesis. FINDINGS: CHEST: PULMONARY ARTERIES: Pulmonary arteries are adequately opacified for evaluation. No evidence of pulmonary embolism. MEDIASTINUM: 4.5 cm ascending thoracic aortic aneurysm, unchanged. No evidence of dissection. Thoracic aortic atherosclerosis. Mild coronary arthrosis of the LAD and right coronary artery. Tiny hiatal hernia. LUNGS AND PLEURA: Mild faint bilateral lower lobe atelectasis. No focal consolidation or pulmonary edema. No pleural effusion or pneumothorax. SOFT TISSUES AND BONES: Bilateral breast augmentation. No acute bone or soft tissue abnormality. ABDOMEN AND PELVIS: LIVER: 13 mm simple inferior left hepatic cyst, benign. GALLBLADDER AND BILE DUCTS: Gallbladder is unremarkable. No biliary ductal dilatation. SPLEEN: Spleen demonstrates no acute abnormality. PANCREAS: Unremarkable. ADRENAL GLANDS: Adrenal glands demonstrate no acute abnormality. KIDNEYS, URETERS AND BLADDER: Simple bilateral renal cysts, measuring up to 1.9 cm  in the right upper kidney, benign (Bosniak 1). No follow up is recommended. Mild fullness of the left upper pole renal moiety, unchanged, likely reflecting prior proximal ureteral stricture when correlating with prior studies. No stones in the kidneys or ureters. No hydronephrosis. No perinephric or periureteral stranding. Urinary bladder is unremarkable. GI AND BOWEL: Sigmoid diverticulosis, without evidence of diverticulitis. Large duodenal diverticulum. Appendix is not completely visualized. Stomach is within normal limits. There is no bowel obstruction. No abnormal bowel wall thickening or distension. REPRODUCTIVE: Status post hysterectomy. PERITONEUM AND RETROPERITONEUM: No ascites or free air. LYMPH NODES: No lymphadenopathy. BONES AND SOFT TISSUES: No acute abnormality of the visualized bones. No focal soft tissue abnormality. IMPRESSION: 1. 4.5 cm ascending thoracic aortic aneurysm, unchanged. No evidence of dissection. 2. No pulmonary embolism. 3. Sigmoid diverticulosis without evidence of diverticulitis. 4. Additional ancillary findings as above. Electronically signed by: Pinkie Pebbles MD 05/05/2024 12:18 AM EDT RP Workstation: [id number]   CT ABDOMEN PELVIS W CONTRAST Result Date: 05/05/2024 EXAM: CTA CHEST PE WITHOUT AND WITH CONTRAST CT ABDOMEN AND PELVIS WITHOUT AND WITH CONTRAST 05/05/2024 12:08:37 AM TECHNIQUE: CTA of the chest was performed after the administration of intravenous contrast. Multiplanar reformatted images are provided for review. MIP images are provided for review. CT of the abdomen and pelvis was performed with the administration of intravenous contrast. Automated exposure control, iterative reconstruction, and/or weight based adjustment of the mA/kV was utilized to reduce the radiation dose to as low as reasonably achievable. COMPARISON: CT of chest dated 09/19/2023 and CT abdomen/pelvis dated 03/10/2017. CLINICAL HISTORY: Aortic aneurysm,  presents with hematemesis. FINDINGS:  CHEST: PULMONARY ARTERIES: Pulmonary arteries are adequately opacified for evaluation. No evidence of pulmonary embolism. MEDIASTINUM: 4.5 cm ascending thoracic aortic aneurysm, unchanged. No evidence of dissection. Thoracic aortic atherosclerosis. Mild coronary arthrosis of the LAD and right coronary artery. Tiny hiatal hernia. LUNGS AND PLEURA: Mild faint bilateral lower lobe atelectasis. No focal consolidation or pulmonary edema. No pleural effusion or pneumothorax. SOFT TISSUES AND BONES: Bilateral breast augmentation. No acute bone or soft tissue abnormality. ABDOMEN AND PELVIS: LIVER: 13 mm simple inferior left hepatic cyst, benign. GALLBLADDER AND BILE DUCTS: Gallbladder is unremarkable. No biliary ductal dilatation. SPLEEN: Spleen demonstrates no acute abnormality. PANCREAS: Unremarkable. ADRENAL GLANDS: Adrenal glands demonstrate no acute abnormality. KIDNEYS, URETERS AND BLADDER: Simple bilateral renal cysts, measuring up to 1.9 cm in the right upper kidney, benign (Bosniak 1). No follow up is recommended. Mild fullness of the left upper pole renal moiety, unchanged, likely reflecting prior proximal ureteral stricture when correlating with prior studies. No stones in the kidneys or ureters. No hydronephrosis. No perinephric or periureteral stranding. Urinary bladder is unremarkable. GI AND BOWEL: Sigmoid diverticulosis, without evidence of diverticulitis. Large duodenal diverticulum. Appendix is not completely visualized. Stomach is within normal limits. There is no bowel obstruction. No abnormal bowel wall thickening or distension. REPRODUCTIVE: Status post hysterectomy. PERITONEUM AND RETROPERITONEUM: No ascites or free air. LYMPH NODES: No lymphadenopathy. BONES AND SOFT TISSUES: No acute abnormality of the visualized bones. No focal soft tissue abnormality. IMPRESSION: 1. 4.5 cm ascending thoracic aortic aneurysm, unchanged. No evidence of dissection. 2. No pulmonary embolism. 3. Sigmoid diverticulosis  without evidence of diverticulitis. 4. Additional ancillary findings as above. Electronically signed by: Pinkie Pebbles MD 05/05/2024 12:18 AM EDT RP Workstation: [id number]      LOS: 1 day    Elgin Lam, MD [facility] 05/06/2024, 8:36 AM   If 7PM-7AM, please contact night-coverage [email or web address]

## 2024-05-06 NOTE — Progress Notes (Signed)
 [facility] Progress Note  SUBJECTIVE:   Interval history: Patricia [name] was seen and evaluated today at bedside. No abdominal pain.  Tolerating clear liquids.  No further hematemesis.  No chest pain or shortness of breath.  Did have some hypotension which is concerning for her.  No bowel movement since procedure.  Pain in knee is improved.  Past Medical History:  Diagnosis Date   Aneurysm of aorta (HCC)    Arthritis    Atrophic vaginitis    Cellulitis and abscess of right leg 05/07/2018   Depression    Diverticulitis    DYSPNEA 06/04/2009   Qualifier: Diagnosis of  By: Jude MD, Harden GAILS.     Edema of both legs    She takes Lasix a couple of times per week to decrease swelling.     Fatty liver    GERD (gastroesophageal reflux disease)    History of kidney stones    Hyperlipidemia    Hypertension    Keratosis    hands and lower limbs   Lymphedema    legs   Memory difficulty    Obesity    Reflux    Shingles    Venous stasis    lower legs   Past Surgical History:  Procedure Laterality Date   ABDOMINAL HYSTERECTOMY     APPENDECTOMY     AUGMENTATION MAMMAPLASTY Bilateral    Basal and Squamous cell cancers excised     CESAREAN SECTION     X 2   CHOLECYSTECTOMY N/A 11/02/2023   Procedure: LAPAROSCOPIC CHOLECYSTECTOMY;  Surgeon: Curvin Deward MOULD, MD;  Location: [facility];  Service: General;  Laterality: N/A;  ASSIST: RNFA   CYSTOSCOPY/RETROGRADE/URETEROSCOPY/STONE EXTRACTION WITH BASKET     removal couldnt blast   ESOPHAGOGASTRODUODENOSCOPY N/A 05/05/2024   Procedure: EGD (ESOPHAGOGASTRODUODENOSCOPY);  Surgeon: Kriss Patricia DEL, DO;  Location: [facility];  Service: Gastroenterology;  Laterality: N/A;   EYE SURGERY     bilateral cataract with lens implants   lipoma surgery     LIPOSUCTION     knees   TOTAL KNEE ARTHROPLASTY Left 09/26/2018   Procedure: TOTAL KNEE ARTHROPLASTY;  Surgeon: Ernie Cough, MD;  Location: [facility];  Service: Orthopedics;  Laterality:  Left;  70 mins   tummy tuck     Current Facility-Administered Medications  Medication Dose Route Frequency Provider Last Rate Last Admin   acetaminophen  (TYLENOL ) tablet 650 mg  650 mg Oral Q6H PRN Kriss Patricia H, DO       Or   acetaminophen  (TYLENOL ) suppository 650 mg  650 mg Rectal Q6H PRN Kriss Patricia DEL, DO       albuterol  (PROVENTIL ) (2.5 MG/3ML) 0.083% nebulizer solution 2.5 mg  2.5 mg Nebulization Q2H PRN Kriss Patricia DEL, DO       ALPRAZolam  (XANAX ) tablet 1 mg  1 mg Oral PRN Brigham Cobbins H, DO   1 mg at 05/05/24 2138   amoxicillin -clavulanate (AUGMENTIN ) 875-125 MG per tablet 1 tablet  1 tablet Oral BID Kriss Patricia H, DO   1 tablet at 05/06/24 1008   fentaNYL  (SUBLIMAZE ) injection 12.5-50 mcg  12.5-50 mcg Intravenous Q2H PRN Kriss Patricia DEL, DO       HYDROcodone -acetaminophen  (NORCO/VICODIN) 5-325 MG per tablet 1 tablet  1 tablet Oral Q4H PRN Kriss Patricia H, DO   1 tablet at 05/06/24 9182   lip balm (CARMEX) ointment   Topical PRN Kriss Patricia DEL, DO   1 Application at 05/05/24 1248   metoprolol  succinate (TOPROL -XL) 24 hr  tablet 50 mg  50 mg Oral Daily Cayetano Mikita H, DO   50 mg at 05/05/24 1257   ondansetron  (ZOFRAN ) tablet 4 mg  4 mg Oral Q6H PRN Kriss Patricia DEL, DO       Or   ondansetron  (ZOFRAN ) injection 4 mg  4 mg Intravenous Q6H PRN Kriss Patricia H, DO   4 mg at 05/05/24 1300   pantoprazole  (PROTONIX ) injection 80 mg  80 mg Intravenous Q12H Kriss Patricia H, DO   80 mg at 05/06/24 1008   sucralfate  (CARAFATE ) 1 GM/10ML suspension 1 g  1 g Oral TID WC & HS Kriss Patricia H, DO   1 g at 05/06/24 0818   venlafaxine  XR (EFFEXOR -XR) 24 hr capsule 75 mg  75 mg Oral Q breakfast Kriss Patricia DEL, DO   75 mg at 05/06/24 9182   Allergies as of 05/04/2024 - Review Complete 05/04/2024  Allergen Reaction Noted   Ciprofloxacin  Other (See Comments) 05/29/2018   Clindamycin/lincomycin Other (See Comments) 05/06/2018   Morphine Itching     Tetracyclines & related Other (See Comments) 10/10/2012   Review of Systems:  Review of Systems  Respiratory:  Negative for shortness of breath.   Cardiovascular:  Negative for chest pain.  Gastrointestinal:  Negative for abdominal pain, nausea and vomiting.    OBJECTIVE:   Temp:  [97.8 F (36.6 C)-98.7 F (37.1 C)] 97.8 F (36.6 C) (07/20 0437) Pulse Rate:  [68-80] 68 (07/20 1005) Resp:  [11-21] 17 (07/20 0437) BP: (80-114)/(41-63) 90/51 (07/20 1005) SpO2:  [94 %-99 %] 94 % (07/20 0437) Last BM Date : 05/04/24 Physical Exam Constitutional:      General: She is not in acute distress.    Appearance: She is not ill-appearing, toxic-appearing or diaphoretic.  Cardiovascular:     Rate and Rhythm: Normal rate and regular rhythm.  Pulmonary:     Effort: No respiratory distress.     Breath sounds: Normal breath sounds.  Abdominal:     General: Bowel sounds are normal. There is no distension.     Palpations: Abdomen is soft.     Tenderness: There is no abdominal tenderness. There is no guarding.  Neurological:     Mental Status: She is alert.     Labs: Recent Labs    05/04/24 2232 05/05/24 0410 05/05/24 0708 05/05/24 1356 05/05/24 2009 05/06/24 0236  WBC 9.9 7.7  --   --   --  5.3  HGB 10.4* 10.0*   < > 8.9* 8.2* 7.7*  HCT 32.4* 31.9*   < > 28.4* 26.1* 24.3*  PLT 287 296  --   --   --  217   < > = values in this interval not displayed.   BMET Recent Labs    05/04/24 2232 05/05/24 0410  NA 139 138  K 3.8 3.5  CL 105 105  CO2 26 23  GLUCOSE 119* 98  BUN 35* 32*  CREATININE 0.69 0.66  CALCIUM  8.3* 8.3*   LFT Recent Labs    05/05/24 0410  PROT 5.2*  ALBUMIN 2.6*  AST 17  ALT 13  ALKPHOS 69  BILITOT 1.4*   PT/INR Recent Labs    05/05/24 0708  LABPROT 14.3  INR 1.1   Diagnostic imaging: CT Angio Chest Aorta W and/or Wo Contrast Result Date: 05/05/2024 EXAM: CTA CHEST PE WITHOUT AND WITH CONTRAST CT ABDOMEN AND PELVIS WITHOUT AND WITH CONTRAST  05/05/2024 12:08:37 AM TECHNIQUE: CTA of the chest was performed after the administration of  intravenous contrast. Multiplanar reformatted images are provided for review. MIP images are provided for review. CT of the abdomen and pelvis was performed with the administration of intravenous contrast. Automated exposure control, iterative reconstruction, and/or weight based adjustment of the mA/kV was utilized to reduce the radiation dose to as low as reasonably achievable. COMPARISON: CT of chest dated 09/19/2023 and CT abdomen/pelvis dated 03/10/2017. CLINICAL HISTORY: Aortic aneurysm, presents with hematemesis. FINDINGS: CHEST: PULMONARY ARTERIES: Pulmonary arteries are adequately opacified for evaluation. No evidence of pulmonary embolism. MEDIASTINUM: 4.5 cm ascending thoracic aortic aneurysm, unchanged. No evidence of dissection. Thoracic aortic atherosclerosis. Mild coronary arthrosis of the LAD and right coronary artery. Tiny hiatal hernia. LUNGS AND PLEURA: Mild faint bilateral lower lobe atelectasis. No focal consolidation or pulmonary edema. No pleural effusion or pneumothorax. SOFT TISSUES AND BONES: Bilateral breast augmentation. No acute bone or soft tissue abnormality. ABDOMEN AND PELVIS: LIVER: 13 mm simple inferior left hepatic cyst, benign. GALLBLADDER AND BILE DUCTS: Gallbladder is unremarkable. No biliary ductal dilatation. SPLEEN: Spleen demonstrates no acute abnormality. PANCREAS: Unremarkable. ADRENAL GLANDS: Adrenal glands demonstrate no acute abnormality. KIDNEYS, URETERS AND BLADDER: Simple bilateral renal cysts, measuring up to 1.9 cm in the right upper kidney, benign (Bosniak 1). No follow up is recommended. Mild fullness of the left upper pole renal moiety, unchanged, likely reflecting prior proximal ureteral stricture when correlating with prior studies. No stones in the kidneys or ureters. No hydronephrosis. No perinephric or periureteral stranding. Urinary bladder is unremarkable. GI AND  BOWEL: Sigmoid diverticulosis, without evidence of diverticulitis. Large duodenal diverticulum. Appendix is not completely visualized. Stomach is within normal limits. There is no bowel obstruction. No abnormal bowel wall thickening or distension. REPRODUCTIVE: Status post hysterectomy. PERITONEUM AND RETROPERITONEUM: No ascites or free air. LYMPH NODES: No lymphadenopathy. BONES AND SOFT TISSUES: No acute abnormality of the visualized bones. No focal soft tissue abnormality. IMPRESSION: 1. 4.5 cm ascending thoracic aortic aneurysm, unchanged. No evidence of dissection. 2. No pulmonary embolism. 3. Sigmoid diverticulosis without evidence of diverticulitis. 4. Additional ancillary findings as above. Electronically signed by: Pinkie Pebbles MD 05/05/2024 12:18 AM EDT RP Workstation: [id number]   CT ABDOMEN PELVIS W CONTRAST Result Date: 05/05/2024 EXAM: CTA CHEST PE WITHOUT AND WITH CONTRAST CT ABDOMEN AND PELVIS WITHOUT AND WITH CONTRAST 05/05/2024 12:08:37 AM TECHNIQUE: CTA of the chest was performed after the administration of intravenous contrast. Multiplanar reformatted images are provided for review. MIP images are provided for review. CT of the abdomen and pelvis was performed with the administration of intravenous contrast. Automated exposure control, iterative reconstruction, and/or weight based adjustment of the mA/kV was utilized to reduce the radiation dose to as low as reasonably achievable. COMPARISON: CT of chest dated 09/19/2023 and CT abdomen/pelvis dated 03/10/2017. CLINICAL HISTORY: Aortic aneurysm, presents with hematemesis. FINDINGS: CHEST: PULMONARY ARTERIES: Pulmonary arteries are adequately opacified for evaluation. No evidence of pulmonary embolism. MEDIASTINUM: 4.5 cm ascending thoracic aortic aneurysm, unchanged. No evidence of dissection. Thoracic aortic atherosclerosis. Mild coronary arthrosis of the LAD and right coronary artery. Tiny hiatal hernia. LUNGS AND PLEURA: Mild faint  bilateral lower lobe atelectasis. No focal consolidation or pulmonary edema. No pleural effusion or pneumothorax. SOFT TISSUES AND BONES: Bilateral breast augmentation. No acute bone or soft tissue abnormality. ABDOMEN AND PELVIS: LIVER: 13 mm simple inferior left hepatic cyst, benign. GALLBLADDER AND BILE DUCTS: Gallbladder is unremarkable. No biliary ductal dilatation. SPLEEN: Spleen demonstrates no acute abnormality. PANCREAS: Unremarkable. ADRENAL GLANDS: Adrenal glands demonstrate no acute abnormality. KIDNEYS, URETERS AND BLADDER: Simple bilateral  renal cysts, measuring up to 1.9 cm in the right upper kidney, benign (Bosniak 1). No follow up is recommended. Mild fullness of the left upper pole renal moiety, unchanged, likely reflecting prior proximal ureteral stricture when correlating with prior studies. No stones in the kidneys or ureters. No hydronephrosis. No perinephric or periureteral stranding. Urinary bladder is unremarkable. GI AND BOWEL: Sigmoid diverticulosis, without evidence of diverticulitis. Large duodenal diverticulum. Appendix is not completely visualized. Stomach is within normal limits. There is no bowel obstruction. No abnormal bowel wall thickening or distension. REPRODUCTIVE: Status post hysterectomy. PERITONEUM AND RETROPERITONEUM: No ascites or free air. LYMPH NODES: No lymphadenopathy. BONES AND SOFT TISSUES: No acute abnormality of the visualized bones. No focal soft tissue abnormality. IMPRESSION: 1. 4.5 cm ascending thoracic aortic aneurysm, unchanged. No evidence of dissection. 2. No pulmonary embolism. 3. Sigmoid diverticulosis without evidence of diverticulitis. 4. Additional ancillary findings as above. Electronically signed by: Pinkie Pebbles MD 05/05/2024 12:18 AM EDT RP Workstation: [id number]   IMPRESSION: Hematemesis, duodenal ulcer x 4 noted on EGD 05/05/2024 (1 ulcer required bipolar cautery) Acute blood loss anemia NSAID use contributing to #1 Orthopedic surgery  04/26/2024 Fecal occult positive stool  PLAN: -High risk duodenal ulcers based on endoscopic view yesterday -Advance from clear liquids today to soft diet -Continue IV PPI during admission, transition to oral PPI therapy once daily for 2 months on discharge -Continue sucralfate  suspension 1 g p.o. 4 times daily, continue this on discharge for total 14 days -Avoidance of all NSAIDs -Start maintenance IV fluids -Trend H/H, transfuse for hemoglobin less than 7 -May see melena at next bowel movement given findings of bleeding on EGD 05/05/2024, discussed this with patient -[facility] will follow   LOS: 1 day   Patricia [name], [facility]

## 2024-05-06 NOTE — Progress Notes (Signed)
 MD notified after rechecking patient's BP and HR. BP 91/48, MAP 60, and HR 66. MD also made aware that latest Hemoglobin was 8.0. MD put in an order for a one time sodium chloride  0.9% bolus 500 mL.

## 2024-05-07 ENCOUNTER — Inpatient Hospital Stay (HOSPITAL_COMMUNITY)

## 2024-05-07 DIAGNOSIS — K922 Gastrointestinal hemorrhage, unspecified: Secondary | ICD-10-CM | POA: Diagnosis not present

## 2024-05-07 LAB — CBC
HCT: 24.6 % — ABNORMAL LOW (ref 36.0–46.0)
Hemoglobin: 7.7 g/dL — ABNORMAL LOW (ref 12.0–15.0)
MCH: 31.2 pg (ref 26.0–34.0)
MCHC: 31.3 g/dL (ref 30.0–36.0)
MCV: 99.6 fL (ref 80.0–100.0)
Platelets: 196 K/uL (ref 150–400)
RBC: 2.47 MIL/uL — ABNORMAL LOW (ref 3.87–5.11)
RDW: 13.8 % (ref 11.5–15.5)
WBC: 6.1 K/uL (ref 4.0–10.5)
nRBC: 0 % (ref 0.0–0.2)

## 2024-05-07 MED ORDER — BISACODYL 10 MG RE SUPP
10.0000 mg | Freq: Once | RECTAL | Status: AC
  Administered 2024-05-07: 10 mg via RECTAL
  Filled 2024-05-07: qty 1

## 2024-05-07 NOTE — Progress Notes (Signed)
 PROGRESS NOTE    [name]  FMW:[id number] DOB: December 24, 1950 DOA: 05/04/2024 PCP: Loreli Kins, MD   Brief Narrative: Patricia [name] is a 73 y.o. female with a history of hypertension, chronic venous insufficiency, GERD, NASH, ascending thoracic aortic aneurysm, hyperlipidemia.  Patient presented secondary to nausea, vomiting, abdominal pain, presyncope with history of hematemesis concerning for upper GI bleeding.  Gastroenterology consulted.   Assessment and Plan:  Hematemesis Concerning for an upper GI bleed. Patient is on Mobic as an outpatient. No prior history of bleeding. 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 Patient started on Protonix . GI consulted and performed an upper GI endoscopy revealing non-bleeding duodenal ulcers with pigmented material, treated with bipolar cautery. No recurrence to date. -GI recommendations: Advance diet, sucralfate  suspension x7 days, Protonix , no NSAIDS  Acute anemia Likely blood loss anemia from hematemesis. Recent baseline hemoglobin of around 15. Hemoglobin of 10.4 on admission. Hemoglobin drifted down to 7.7 today. No obvious signs of continued bleeding. -Trend CBC/H&H  Primary hypertension Patient is on amlodipine , Maxide and metoprolol  as an outpatient, which were held on admission. Metoprolol  restarted, but then held again secondary to hypotension.  Hypotension Likely related to blood loss anemia and complicated by metoprolol  use. Improved with IV fluids.  Nausea/vomiting Appears to be stable at this time. No episodes this morning or overnight.  Abdominal pain Unclear etiology. Pain is worse on LLQ but also around RLQ. CT imaging shows evidence of diverticulosis, but without diverticulitis; no leukocytosis or fevers. Possibly related to emesis and muscle strain, but unclear. History of hysterectomy without oophorectomy, per patient. -Abdominal x-ray  GERD Patient is  on Protonix  as an outpatient. -Continue Protonix   NASH Noted. Mildly elevated bilirubin. INR of 1.1. No history of ascites.  Chronic venous insufficiency Noted.  Ascending thoracic aortic aneurysm Stable based on CT read.  Hyperlipidemia Patient is on Crestor  as an outpatient which was held on admission.  Depression -Continue Effexor   Recent right knee replacement Noted.  Overweight Current weight does not meet criteria for obesity.    DVT prophylaxis: SCDs Code Status:   Code Status: Full Code Family Communication: Husband at bedside Disposition Plan: Discharge home pending stable hemoglobin, stable blood pressure and ongoing GI recommendations   Consultants:  [facility]  Procedures:  Upper GI endoscopy  Antimicrobials: None    Subjective: No issues overnight except for ongoing lower abdominal pain.  Objective: BP (!) 100/59 (BP Location: Right Arm)   Pulse 68   Temp 97.6 F (36.4 C) (Oral)   Resp (!) 21   Ht 5' 4 (1.626 m)   Wt 72.6 kg   SpO2 97%   BMI 27.46 kg/m   Examination:  General exam: Appears calm and comfortable Respiratory system: Clear to auscultation. Respiratory effort normal. Cardiovascular system: S1 & S2 heard, RRR. 1/6 systolic murmur Gastrointestinal system: Abdomen is nondistended, soft and tender in RLQ and mildly in LLQ. Normal bowel sounds heard. Central nervous system: Alert and oriented. No focal neurological deficits. Psychiatry: Judgement and insight appear normal. Mood & affect appropriate.    Data Reviewed: I have personally reviewed following labs and imaging studies  CBC Lab Results  Component Value Date   WBC 6.1 05/07/2024   RBC 2.47 (L) 05/07/2024   HGB 7.7 (L) 05/07/2024   HCT 24.6 (L) 05/07/2024   MCV 99.6 05/07/2024   MCH 31.2 05/07/2024   PLT 196 05/07/2024   MCHC 31.3 05/07/2024   RDW 13.8  05/07/2024   LYMPHSABS 0.9 05/04/2024   MONOABS 0.6 05/04/2024   EOSABS 0.3 05/04/2024    BASOSABS 0.1 05/04/2024     Last metabolic panel Lab Results  Component Value Date   NA 138 05/05/2024   K 3.5 05/05/2024   CL 105 05/05/2024   CO2 23 05/05/2024   BUN 32 (H) 05/05/2024   CREATININE 0.66 05/05/2024   GLUCOSE 98 05/05/2024   GFRNONAA >60 05/05/2024   GFRAA 105 05/16/2020   CALCIUM  8.3 (L) 05/05/2024   PROT 5.2 (L) 05/05/2024   ALBUMIN 2.6 (L) 05/05/2024   BILITOT 1.4 (H) 05/05/2024   ALKPHOS 69 05/05/2024   AST 17 05/05/2024   ALT 13 05/05/2024   ANIONGAP 10 05/05/2024    GFR: Estimated Creatinine Clearance: 60.3 mL/min (by C-G formula based on SCr of 0.66 mg/dL).  No results found for this or any previous visit (from the past 240 hours).    Radiology Studies: DG Abd Portable 1V Result Date: 05/07/2024 CLINICAL DATA:  Abdominal pain and tenderness. EXAM: PORTABLE ABDOMEN - 1 VIEW COMPARISON:  None Available. FINDINGS: Gas is seen in nondilated small bowel, scattered within the colon and also within the rectum. Stool is predominantly seen in the sigmoid colon. No unexpected radiopaque calculi. Surgical clips in the right upper quadrant. IMPRESSION: No acute findings. Electronically Signed   By: Newell Eke M.D.   On: 05/07/2024 09:43      LOS: 2 days    Elgin Lam, MD [facility] 05/07/2024, 11:36 AM   If 7PM-7AM, please contact night-coverage [email or web address]

## 2024-05-07 NOTE — Anesthesia Postprocedure Evaluation (Signed)
 Anesthesia Post Note  Patient: Patricia [name]  Procedure(s) Performed: EGD (ESOPHAGOGASTRODUODENOSCOPY)     Patient location during evaluation: Endoscopy Anesthesia Type: MAC Level of consciousness: awake and alert Pain management: pain level controlled Vital Signs Assessment: post-procedure vital signs reviewed and stable Respiratory status: spontaneous breathing, nonlabored ventilation, respiratory function stable and patient connected to nasal cannula oxygen Cardiovascular status: stable and blood pressure returned to baseline Postop Assessment: no apparent nausea or vomiting Anesthetic complications: no   No notable events documented.  Last Vitals:  Vitals:   05/06/24 2111 05/07/24 0632  BP: 118/63 123/67  Pulse: 72 69  Resp: 15 15  Temp: 36.8 C 36.8 C  SpO2: 93% 93%    Last Pain:  Vitals:   05/07/24 0738  TempSrc:   PainSc: 2                  Shontell Prosser S

## 2024-05-07 NOTE — Progress Notes (Signed)
 Subjective: Mild epigastric discomfort. (Pre- and post-endoscopy)  Objective: Vital signs in last 24 hours: Temp:  [98 F (36.7 C)-98.3 F (36.8 C)] 98.3 F (36.8 C) (07/21 0632) Pulse Rate:  [66-78] 78 (07/21 0947) Resp:  [15-24] 24 (07/21 0947) BP: (84-123)/(48-67) 102/51 (07/21 0947) SpO2:  [93 %-96 %] 96 % (07/21 0947) Weight change:  Last BM Date : 05/04/24  PE: GEN:  NAD NEURO:  No encephalopathy ABD:  Soft, mild epigastric discomfort, no peritonitis  Lab Results: CBC    Component Value Date/Time   WBC 6.1 05/07/2024 0346   RBC 2.47 (L) 05/07/2024 0346   HGB 7.7 (L) 05/07/2024 0346   HCT 24.6 (L) 05/07/2024 0346   PLT 196 05/07/2024 0346   MCV 99.6 05/07/2024 0346   MCH 31.2 05/07/2024 0346   MCHC 31.3 05/07/2024 0346   RDW 13.8 05/07/2024 0346   LYMPHSABS 0.9 05/04/2024 2232   MONOABS 0.6 05/04/2024 2232   EOSABS 0.3 05/04/2024 2232   BASOSABS 0.1 05/04/2024 2232  CMP     Component Value Date/Time   NA 138 05/05/2024 0410   NA 142 09/09/2023 1125   K 3.5 05/05/2024 0410   CL 105 05/05/2024 0410   CO2 23 05/05/2024 0410   GLUCOSE 98 05/05/2024 0410   BUN 32 (H) 05/05/2024 0410   BUN 14 09/09/2023 1125   CREATININE 0.66 05/05/2024 0410   CALCIUM  8.3 (L) 05/05/2024 0410   PROT 5.2 (L) 05/05/2024 0410   ALBUMIN 2.6 (L) 05/05/2024 0410   AST 17 05/05/2024 0410   ALT 13 05/05/2024 0410   ALKPHOS 69 05/05/2024 0410   BILITOT 1.4 (H) 05/05/2024 0410   EGFR 66 09/09/2023 1125   GFRNONAA >60 05/05/2024 0410    Studies/Results: DG Abd Portable 1V Result Date: 05/07/2024 CLINICAL DATA:  Abdominal pain and tenderness. EXAM: PORTABLE ABDOMEN - 1 VIEW COMPARISON:  None Available. FINDINGS: Gas is seen in nondilated small bowel, scattered within the colon and also within the rectum. Stool is predominantly seen in the sigmoid colon. No unexpected radiopaque calculi. Surgical clips in the right upper quadrant. IMPRESSION: No acute findings. Electronically Signed    By: Newell Eke M.D.   On: 05/07/2024 09:43     Assessment:   Epigastric/back pain, negative CT, predating endoscopy.  Abd xray negative. Hematemesis, resolved. Duodenal ulcers one s/p endoscopic therapy.  Plan:   Follow CBCs; transfuse if needed. 72 hours PPI drip (continue at least through tomorrow morning). PT/OT/OOBTC as tolerated. No ASA/NSAIDs. [facility] will follow; pending patient's physical conditioning and Hgb trend, possible discharge in the next 1-2 days.    Patricia [name] 05/07/2024, 10:14 AM   Cell [phone] If no answer or after 5 PM call [phone]

## 2024-05-07 NOTE — Progress Notes (Signed)
   05/07/24 1218  TOC Brief Assessment  Insurance and Status Reviewed  Patient has primary care physician Yes  Home environment has been reviewed Resides in single family home with spouse  Prior level of function: Independent with ADLs at baseline  Prior/Current Home Services No current home services  Social Drivers of Health Review SDOH reviewed no interventions necessary  Readmission risk has been reviewed Yes  Transition of care needs no transition of care needs at this time

## 2024-05-08 DIAGNOSIS — K922 Gastrointestinal hemorrhage, unspecified: Secondary | ICD-10-CM | POA: Diagnosis not present

## 2024-05-08 LAB — CBC
HCT: 24.1 % — ABNORMAL LOW (ref 36.0–46.0)
Hemoglobin: 7.5 g/dL — ABNORMAL LOW (ref 12.0–15.0)
MCH: 30.5 pg (ref 26.0–34.0)
MCHC: 31.1 g/dL (ref 30.0–36.0)
MCV: 98 fL (ref 80.0–100.0)
Platelets: 214 K/uL (ref 150–400)
RBC: 2.46 MIL/uL — ABNORMAL LOW (ref 3.87–5.11)
RDW: 13.7 % (ref 11.5–15.5)
WBC: 5.7 K/uL (ref 4.0–10.5)
nRBC: 0 % (ref 0.0–0.2)

## 2024-05-08 MED ORDER — POLYETHYLENE GLYCOL 3350 17 G PO PACK
17.0000 g | PACK | Freq: Every day | ORAL | Status: DC
  Administered 2024-05-08 – 2024-05-09 (×2): 17 g via ORAL
  Filled 2024-05-08 (×2): qty 1

## 2024-05-08 MED ORDER — PANTOPRAZOLE SODIUM 40 MG PO TBEC
40.0000 mg | DELAYED_RELEASE_TABLET | Freq: Two times a day (BID) | ORAL | Status: DC
  Administered 2024-05-08 – 2024-05-09 (×2): 40 mg via ORAL
  Filled 2024-05-08 (×2): qty 1

## 2024-05-08 MED ORDER — DOCUSATE SODIUM 50 MG PO CAPS
50.0000 mg | ORAL_CAPSULE | Freq: Once | ORAL | Status: AC
  Administered 2024-05-08: 50 mg via ORAL
  Filled 2024-05-08: qty 1

## 2024-05-08 MED ORDER — SMOG ENEMA
960.0000 mL | Freq: Once | RECTAL | Status: DC
  Filled 2024-05-08: qty 960

## 2024-05-08 NOTE — Progress Notes (Signed)
 Subjective: Mild abdominal discomfort. Minimal bowel movements (constipated). No hematemesis or blood in stool.  Objective: Vital signs in last 24 hours: Temp:  [97.9 F (36.6 C)-98.2 F (36.8 C)] 98 F (36.7 C) (07/22 1020) Pulse Rate:  [74-75] 74 (07/22 1020) Resp:  [15] 15 (07/22 0524) BP: (109-111)/(59-61) 111/61 (07/22 1020) SpO2:  [95 %-98 %] 98 % (07/22 1020) Weight change:  Last BM Date : 05/04/24  PE: GEN:  NAD NEURO:  No encephalopathy ABD:  Soft, mild generalized tenderness without peritonitis  Lab Results: CBC    Component Value Date/Time   WBC 5.7 05/08/2024 0427   RBC 2.46 (L) 05/08/2024 0427   HGB 7.5 (L) 05/08/2024 0427   HCT 24.1 (L) 05/08/2024 0427   PLT 214 05/08/2024 0427   MCV 98.0 05/08/2024 0427   MCH 30.5 05/08/2024 0427   MCHC 31.1 05/08/2024 0427   RDW 13.7 05/08/2024 0427   LYMPHSABS 0.9 05/04/2024 2232   MONOABS 0.6 05/04/2024 2232   EOSABS 0.3 05/04/2024 2232   BASOSABS 0.1 05/04/2024 2232  CMP     Component Value Date/Time   NA 138 05/05/2024 0410   NA 142 09/09/2023 1125   K 3.5 05/05/2024 0410   CL 105 05/05/2024 0410   CO2 23 05/05/2024 0410   GLUCOSE 98 05/05/2024 0410   BUN 32 (H) 05/05/2024 0410   BUN 14 09/09/2023 1125   CREATININE 0.66 05/05/2024 0410   CALCIUM  8.3 (L) 05/05/2024 0410   PROT 5.2 (L) 05/05/2024 0410   ALBUMIN 2.6 (L) 05/05/2024 0410   AST 17 05/05/2024 0410   ALT 13 05/05/2024 0410   ALKPHOS 69 05/05/2024 0410   BILITOT 1.4 (H) 05/05/2024 0410   EGFR 66 09/09/2023 1125   GFRNONAA >60 05/05/2024 0410    Studies/Results: DG Abd Portable 1V Result Date: 05/07/2024 CLINICAL DATA:  Abdominal pain and tenderness. EXAM: PORTABLE ABDOMEN - 1 VIEW COMPARISON:  None Available. FINDINGS: Gas is seen in nondilated small bowel, scattered within the colon and also within the rectum. Stool is predominantly seen in the sigmoid colon. No unexpected radiopaque calculi. Surgical clips in the right upper quadrant.  IMPRESSION: No acute findings. Electronically Signed   By: Newell Eke M.D.   On: 05/07/2024 09:43   Assessment:   Epigastric/back pain, negative CT, predating endoscopy.  Abd xray negative. 2.  Hematemesis, resolved. 3.  Duodenal ulcers one s/p endoscopic therapy.  Plan:   OK transition oral PPI. Follow CBCs; transfuse as needed. Miralax , suppositories for constipation ok. OOBTC, ambulate room/halls as tolerated. Advance diet as tolerated. [facility] will follow; hopefully able to be discharged home tomorrow.   Patricia [name] 05/08/2024, 1:46 PM   Cell [phone] If no answer or after 5 PM call [phone]]

## 2024-05-08 NOTE — Progress Notes (Addendum)
 PROGRESS NOTE    Patricia [name]  FMW:[id number] DOB: 03-12-1951 DOA: 05/04/2024 PCP: Loreli Kins, MD   Brief Narrative: Patricia [name] is a 73 y.o. female with a history of hypertension, chronic venous insufficiency, GERD, NASH, ascending thoracic aortic aneurysm, hyperlipidemia.  Patient presented secondary to nausea, vomiting, abdominal pain, presyncope with history of hematemesis concerning for upper GI bleeding.  Gastroenterology consulted. Upper endoscopy performed revealing non-bleeding duodenal ulcers. Discharge complicated by drifting hemoglobin.   Assessment and Plan:  Hematemesis Concerning for an upper GI bleed. Patient is on Mobic as an outpatient. No prior history of bleeding. 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 Patient started on Protonix . GI consulted and performed an upper GI endoscopy revealing non-bleeding duodenal ulcers with pigmented material, treated with bipolar cautery. No recurrence to date. -GI recommendations: Advance diet, sucralfate  suspension x7 days, Protonix , no NSAIDS  Acute anemia Likely blood loss anemia from hematemesis. Recent baseline hemoglobin of around 15. Hemoglobin of 10.4 on admission. Hemoglobin drifted down to 7.7 today. No obvious signs of continued bleeding. -Trend CBC/H&H  Primary hypertension Patient is on amlodipine , Maxide and metoprolol  as an outpatient, which were held on admission. Metoprolol  restarted, but then held again secondary to hypotension.  Hypotension Likely related to blood loss anemia and complicated by metoprolol  use. Improved with IV fluids. Blood pressure improved. -Hold antihypertensives  Nausea/vomiting Appears to be stable at this time. No episodes this morning or overnight.  Abdominal pain Unclear etiology. Pain is worse on LLQ but also around RLQ. CT imaging shows evidence of diverticulosis, but without diverticulitis; no leukocytosis or  fevers. Possibly related to emesis and muscle strain, but unclear. History of hysterectomy without oophorectomy, per patient. X-ray suggests stool. No stool output after dulcolax suppository.  -Enema  GERD Patient is on Protonix  as an outpatient. -Continue Protonix   NASH Noted. Mildly elevated bilirubin. INR of 1.1. No history of ascites.  Chronic venous insufficiency Noted.  Ascending thoracic aortic aneurysm Stable based on CT read.  Hyperlipidemia Patient is on Crestor  as an outpatient which was held on admission.  Depression -Continue Effexor   Recent right knee replacement Noted.  Overweight Current weight does not meet criteria for obesity.    DVT prophylaxis: SCDs Code Status:   Code Status: Full Code Family Communication: Husband at bedside Disposition Plan: Discharge home pending stable hemoglobin, stable blood pressure and ongoing GI recommendations. Likely discharge in 24 hours.   Consultants:  [facility]  Procedures:  Upper GI endoscopy  Antimicrobials: None    Subjective: Still with some lower abdominal pain. No other issues overnight. Hoping to go home today.  Objective: BP 111/61 (BP Location: Right Arm)   Pulse 74   Temp 98 F (36.7 C) (Oral)   Resp 15   Ht 5' 4 (1.626 m)   Wt 72.6 kg   SpO2 98%   BMI 27.46 kg/m   Examination:  General exam: Appears calm and comfortable Respiratory system: Clear to auscultation. Respiratory effort normal. Cardiovascular system: S1 & S2 heard, RRR. No murmurs, rubs, gallops or clicks. Gastrointestinal system: Abdomen is nondistended, soft and nontender. Normal bowel sounds heard. Central nervous system: Alert and oriented. No focal neurological deficits. Psychiatry: Judgement and insight appear normal. Mood & affect appropriate.    Data Reviewed: I have personally reviewed following labs and imaging studies  CBC Lab Results  Component Value Date   WBC 5.7 05/08/2024   RBC 2.46 (L)  05/08/2024   HGB  7.5 (L) 05/08/2024   HCT 24.1 (L) 05/08/2024   MCV 98.0 05/08/2024   MCH 30.5 05/08/2024   PLT 214 05/08/2024   MCHC 31.1 05/08/2024   RDW 13.7 05/08/2024   LYMPHSABS 0.9 05/04/2024   MONOABS 0.6 05/04/2024   EOSABS 0.3 05/04/2024   BASOSABS 0.1 05/04/2024     Last metabolic panel Lab Results  Component Value Date   NA 138 05/05/2024   K 3.5 05/05/2024   CL 105 05/05/2024   CO2 23 05/05/2024   BUN 32 (H) 05/05/2024   CREATININE 0.66 05/05/2024   GLUCOSE 98 05/05/2024   GFRNONAA >60 05/05/2024   GFRAA 105 05/16/2020   CALCIUM  8.3 (L) 05/05/2024   PROT 5.2 (L) 05/05/2024   ALBUMIN 2.6 (L) 05/05/2024   BILITOT 1.4 (H) 05/05/2024   ALKPHOS 69 05/05/2024   AST 17 05/05/2024   ALT 13 05/05/2024   ANIONGAP 10 05/05/2024    GFR: Estimated Creatinine Clearance: 60.3 mL/min (by C-G formula based on SCr of 0.66 mg/dL).  No results found for this or any previous visit (from the past 240 hours).    Radiology Studies: DG Abd Portable 1V Result Date: 05/07/2024 CLINICAL DATA:  Abdominal pain and tenderness. EXAM: PORTABLE ABDOMEN - 1 VIEW COMPARISON:  None Available. FINDINGS: Gas is seen in nondilated small bowel, scattered within the colon and also within the rectum. Stool is predominantly seen in the sigmoid colon. No unexpected radiopaque calculi. Surgical clips in the right upper quadrant. IMPRESSION: No acute findings. Electronically Signed   By: Newell Eke M.D.   On: 05/07/2024 09:43      LOS: 3 days    Elgin Lam, MD [facility] 05/08/2024, 1:55 PM   If 7PM-7AM, please contact night-coverage [email or web address]

## 2024-05-08 NOTE — Progress Notes (Signed)
 Patient requested a stool softener d/t having 2 medium sized hard stools today. Notified J. Blondie, NP. New order for one-time dose colace ordered and given. Will continue to monitor.

## 2024-05-09 ENCOUNTER — Other Ambulatory Visit (HOSPITAL_COMMUNITY): Payer: Self-pay

## 2024-05-09 DIAGNOSIS — K922 Gastrointestinal hemorrhage, unspecified: Secondary | ICD-10-CM | POA: Diagnosis not present

## 2024-05-09 LAB — CBC
HCT: 22.6 % — ABNORMAL LOW (ref 36.0–46.0)
Hemoglobin: 7.4 g/dL — ABNORMAL LOW (ref 12.0–15.0)
MCH: 31.6 pg (ref 26.0–34.0)
MCHC: 32.7 g/dL (ref 30.0–36.0)
MCV: 96.6 fL (ref 80.0–100.0)
Platelets: 194 K/uL (ref 150–400)
RBC: 2.34 MIL/uL — ABNORMAL LOW (ref 3.87–5.11)
RDW: 14.1 % (ref 11.5–15.5)
WBC: 6.1 K/uL (ref 4.0–10.5)
nRBC: 0 % (ref 0.0–0.2)

## 2024-05-09 MED ORDER — SENNOSIDES-DOCUSATE SODIUM 8.6-50 MG PO TABS: 1.0000 | ORAL_TABLET | Freq: Two times a day (BID) | ORAL | 0 refills | Status: AC

## 2024-05-09 MED ORDER — POLYETHYLENE GLYCOL 3350 17 G PO PACK
17.0000 g | PACK | Freq: Two times a day (BID) | ORAL | Status: DC
  Administered 2024-05-09: 17 g via ORAL
  Filled 2024-05-09: qty 1

## 2024-05-09 MED ORDER — POLYETHYLENE GLYCOL 3350 17 G PO PACK: 17.0000 g | PACK | Freq: Two times a day (BID) | ORAL | 0 refills | Status: AC

## 2024-05-09 MED ORDER — ASPIRIN 81 MG PO TBEC: 81.0000 mg | DELAYED_RELEASE_TABLET | Freq: Every day | ORAL | Status: AC

## 2024-05-09 MED ORDER — PANTOPRAZOLE SODIUM 40 MG PO TBEC
40.0000 mg | DELAYED_RELEASE_TABLET | Freq: Two times a day (BID) | ORAL | 0 refills | Status: AC
  Filled 2024-05-09: qty 60, 30d supply, fill #0

## 2024-05-09 MED ORDER — SENNOSIDES-DOCUSATE SODIUM 8.6-50 MG PO TABS
1.0000 | ORAL_TABLET | Freq: Two times a day (BID) | ORAL | Status: DC
  Administered 2024-05-09: 1 via ORAL
  Filled 2024-05-09: qty 1

## 2024-05-09 MED ORDER — SUCRALFATE 1 GM/10ML PO SUSP
1.0000 g | Freq: Three times a day (TID) | ORAL | 0 refills | Status: AC
  Filled 2024-05-09: qty 420, 11d supply, fill #0

## 2024-05-09 NOTE — Discharge Summary (Signed)
 Physician Discharge Summary  Patricia [name] FMW:[id number] DOB: January 31, 1951 DOA: 05/04/2024  PCP: Loreli Kins, MD  Admit date: 05/04/2024 Discharge date: 05/09/2024  Admitted From: Home Disposition: Home  Recommendations for Outpatient Follow-up:  Follow up with PCP in 1-2 weeks Follow-up with GI in 1 to 2 weeks as scheduled:  Home Health: None Equipment/Devices: None  Discharge Condition: Stable CODE STATUS: Full Diet recommendation: Low-salt low-fat low-carb diet  Brief/Interim Summary: Patricia [name] is a 73 y.o. female with a history of hypertension, chronic venous insufficiency, GERD, NASH, ascending thoracic aortic aneurysm, hyperlipidemia.  Patient presented secondary to nausea, vomiting, abdominal pain, presyncope with history of hematemesis concerning for upper GI bleeding.  Gastroenterology consulted. Upper endoscopy performed revealing non-bleeding duodenal ulcers.  Patient admitted as above with intractable nausea vomiting abdominal pain and hematemesis, status post endoscopy revealing nonbleeding ulcers.  Hemoglobin now stabilized without further signs or symptoms of bleeding.  Patient tolerating p.o., complaining of some constipation which is improving with supportive care.  Otherwise patient stable and agreeable for discharge home, close follow-up outpatient with PCP and GI as scheduled.  Discussed need for return to hospital if she has any notable bright red blood per rectum or hematemesis or black tarry stools.  Otherwise continue regimen as below.  Of note patient's blood pressure remained borderline low off home antihypertensives, these will be held at discharge, low threshold to reintroduce to patient if blood pressure continues to rise over the next 1 to 2 weeks.    Discharge Diagnoses:  Principal Problem:   Upper GI bleed  Acute hematemesis Acute symptomatic anemia - Likely GI bleed given nonbleeding ulcer noted on endoscopy with history of meloxicam  use - Continue PPI, Carafate , no further signs symptoms of bleeding, hemoglobin stable  Primary hypertension Hold metoprolol , amlodipine    Nausea/vomiting Resolved   Abdominal pain Likely secondary to stool burden, continue laxatives and stool softeners as above   GERD Continue Protonix , Carafate  as above   NASH Noted. Mildly elevated bilirubin. INR of 1.1. No history of ascites.   Chronic venous insufficiency Noted.   Ascending thoracic aortic aneurysm Stable based on CT read.   Hyperlipidemia Patient is on Crestor  as an outpatient which was held on admission.   Depression -Continue Effexor    Recent right knee replacement Noted.  Discharge Instructions  Discharge Instructions     Call MD for:  difficulty breathing, headache or visual disturbances   Complete by: As directed    Call MD for:  extreme fatigue   Complete by: As directed    Call MD for:  hives   Complete by: As directed    Call MD for:  persistant dizziness or light-headedness   Complete by: As directed    Call MD for:  persistant nausea and vomiting   Complete by: As directed    Call MD for:  severe uncontrolled pain   Complete by: As directed    Call MD for:  temperature >100.4   Complete by: As directed    Diet - low sodium heart healthy   Complete by: As directed    Increase activity slowly   Complete by: As directed       Allergies as of 05/09/2024       Reactions   Ciprofloxacin  Other (See Comments)   Pt has a descending heart aneurism and has been instructed not to take Cipro .    Clindamycin/lincomycin Other (See Comments)   Stomach pain   Morphine Itching   Tetracyclines & Related  Other (See Comments)   Stomach pain        Medication List     STOP taking these medications    amLODipine  5 MG tablet Commonly known as: NORVASC    amoxicillin -clavulanate 875-125 MG tablet Commonly known as: AUGMENTIN    meloxicam 15 MG tablet Commonly known as: MOBIC   methocarbamol   500 MG tablet Commonly known as: ROBAXIN    metoprolol  succinate 50 MG 24 hr tablet Commonly known as: TOPROL -XL       TAKE these medications    acetaminophen  650 MG CR tablet Commonly known as: TYLENOL  Take 650 mg by mouth every 8 (eight) hours as needed for pain.   ALPRAZolam  1 MG tablet Commonly known as: XANAX  Take 1 mg by mouth as needed (anxiety when flying).   aspirin  EC 81 MG tablet Take 1 tablet (81 mg total) by mouth daily. Swallow whole. Start taking on: May 13, 2024 What changed: These instructions start on May 13, 2024. If you are unsure what to do until then, ask your doctor or other care provider.   desvenlafaxine 50 MG 24 hr tablet Commonly known as: PRISTIQ Take 50-100 mg by mouth See admin instructions. Take 50 mg daily, may increase to 100 mg daily as needed for depression   diclofenac 75 MG EC tablet Commonly known as: VOLTAREN TAKE 1 TABLET TWICE A DAY BY ORAL ROUTE WITH MEAL(S).   HYDROcodone -acetaminophen  5-325 MG tablet Commonly known as: NORCO/VICODIN Take 1 tablet by mouth every 4 (four) hours as needed for severe pain (pain score 7-10) or moderate pain (pain score 4-6).   ondansetron  4 MG disintegrating tablet Commonly known as: ZOFRAN -ODT TAKE 1 TABLET BY MOUTH EVERY 6 HOURS AS NEEDED FOR NAUSEA AND VOMITING   pantoprazole  40 MG tablet Commonly known as: PROTONIX  Take 1 tablet (40 mg total) by mouth 2 (two) times daily before a meal.   polyethylene glycol 17 g packet Commonly known as: MIRALAX  / GLYCOLAX  Take 17 g by mouth 2 (two) times daily.   rosuvastatin  20 MG tablet Commonly known as: CRESTOR  TAKE ONE TABLET BY MOUTH ONE TIME DAILY   senna-docusate 8.6-50 MG tablet Commonly known as: Senokot-S Take 1 tablet by mouth 2 (two) times daily.   Senna-Time 8.6 MG tablet Generic drug: senna TAKE 2 TABLETS EVERY DAY BY ORAL ROUTE AT BEDTIME FOR 14 DAYS.   sucralfate  1 GM/10ML suspension Commonly known as: CARAFATE  Take 10 mLs (1 g  total) by mouth 4 (four) times daily -  with meals and at bedtime.   triamterene -hydrochlorothiazide  37.5-25 MG tablet Commonly known as: MAXZIDE -25 Take 1 tablet by mouth daily.        Allergies  Allergen Reactions   Ciprofloxacin  Other (See Comments)    Pt has a descending heart aneurism and has been instructed not to take Cipro .    Clindamycin/Lincomycin Other (See Comments)    Stomach pain   Morphine Itching   Tetracyclines & Related Other (See Comments)    Stomach pain    Consultations: GI, Dr Burnette  Procedures/Studies: DG Abd Portable 1V Result Date: 05/07/2024 CLINICAL DATA:  Abdominal pain and tenderness. EXAM: PORTABLE ABDOMEN - 1 VIEW COMPARISON:  None Available. FINDINGS: Gas is seen in nondilated small bowel, scattered within the colon and also within the rectum. Stool is predominantly seen in the sigmoid colon. No unexpected radiopaque calculi. Surgical clips in the right upper quadrant. IMPRESSION: No acute findings. Electronically Signed   By: Newell Eke M.D.   On: 05/07/2024 09:43  CT Angio Chest Aorta W and/or Wo Contrast Result Date: 05/05/2024 EXAM: CTA CHEST PE WITHOUT AND WITH CONTRAST CT ABDOMEN AND PELVIS WITHOUT AND WITH CONTRAST 05/05/2024 12:08:37 AM TECHNIQUE: CTA of the chest was performed after the administration of intravenous contrast. Multiplanar reformatted images are provided for review. MIP images are provided for review. CT of the abdomen and pelvis was performed with the administration of intravenous contrast. Automated exposure control, iterative reconstruction, and/or weight based adjustment of the mA/kV was utilized to reduce the radiation dose to as low as reasonably achievable. COMPARISON: CT of chest dated 09/19/2023 and CT abdomen/pelvis dated 03/10/2017. CLINICAL HISTORY: Aortic aneurysm, presents with hematemesis. FINDINGS: CHEST: PULMONARY ARTERIES: Pulmonary arteries are adequately opacified for evaluation. No evidence of pulmonary  embolism. MEDIASTINUM: 4.5 cm ascending thoracic aortic aneurysm, unchanged. No evidence of dissection. Thoracic aortic atherosclerosis. Mild coronary arthrosis of the LAD and right coronary artery. Tiny hiatal hernia. LUNGS AND PLEURA: Mild faint bilateral lower lobe atelectasis. No focal consolidation or pulmonary edema. No pleural effusion or pneumothorax. SOFT TISSUES AND BONES: Bilateral breast augmentation. No acute bone or soft tissue abnormality. ABDOMEN AND PELVIS: LIVER: 13 mm simple inferior left hepatic cyst, benign. GALLBLADDER AND BILE DUCTS: Gallbladder is unremarkable. No biliary ductal dilatation. SPLEEN: Spleen demonstrates no acute abnormality. PANCREAS: Unremarkable. ADRENAL GLANDS: Adrenal glands demonstrate no acute abnormality. KIDNEYS, URETERS AND BLADDER: Simple bilateral renal cysts, measuring up to 1.9 cm in the right upper kidney, benign (Bosniak 1). No follow up is recommended. Mild fullness of the left upper pole renal moiety, unchanged, likely reflecting prior proximal ureteral stricture when correlating with prior studies. No stones in the kidneys or ureters. No hydronephrosis. No perinephric or periureteral stranding. Urinary bladder is unremarkable. GI AND BOWEL: Sigmoid diverticulosis, without evidence of diverticulitis. Large duodenal diverticulum. Appendix is not completely visualized. Stomach is within normal limits. There is no bowel obstruction. No abnormal bowel wall thickening or distension. REPRODUCTIVE: Status post hysterectomy. PERITONEUM AND RETROPERITONEUM: No ascites or free air. LYMPH NODES: No lymphadenopathy. BONES AND SOFT TISSUES: No acute abnormality of the visualized bones. No focal soft tissue abnormality. IMPRESSION: 1. 4.5 cm ascending thoracic aortic aneurysm, unchanged. No evidence of dissection. 2. No pulmonary embolism. 3. Sigmoid diverticulosis without evidence of diverticulitis. 4. Additional ancillary findings as above. Electronically signed by:  Pinkie Pebbles MD 05/05/2024 12:18 AM EDT RP Workstation: [id number]   CT ABDOMEN PELVIS W CONTRAST Result Date: 05/05/2024 EXAM: CTA CHEST PE WITHOUT AND WITH CONTRAST CT ABDOMEN AND PELVIS WITHOUT AND WITH CONTRAST 05/05/2024 12:08:37 AM TECHNIQUE: CTA of the chest was performed after the administration of intravenous contrast. Multiplanar reformatted images are provided for review. MIP images are provided for review. CT of the abdomen and pelvis was performed with the administration of intravenous contrast. Automated exposure control, iterative reconstruction, and/or weight based adjustment of the mA/kV was utilized to reduce the radiation dose to as low as reasonably achievable. COMPARISON: CT of chest dated 09/19/2023 and CT abdomen/pelvis dated 03/10/2017. CLINICAL HISTORY: Aortic aneurysm, presents with hematemesis. FINDINGS: CHEST: PULMONARY ARTERIES: Pulmonary arteries are adequately opacified for evaluation. No evidence of pulmonary embolism. MEDIASTINUM: 4.5 cm ascending thoracic aortic aneurysm, unchanged. No evidence of dissection. Thoracic aortic atherosclerosis. Mild coronary arthrosis of the LAD and right coronary artery. Tiny hiatal hernia. LUNGS AND PLEURA: Mild faint bilateral lower lobe atelectasis. No focal consolidation or pulmonary edema. No pleural effusion or pneumothorax. SOFT TISSUES AND BONES: Bilateral breast augmentation. No acute bone or soft tissue abnormality. ABDOMEN AND PELVIS: LIVER:  13 mm simple inferior left hepatic cyst, benign. GALLBLADDER AND BILE DUCTS: Gallbladder is unremarkable. No biliary ductal dilatation. SPLEEN: Spleen demonstrates no acute abnormality. PANCREAS: Unremarkable. ADRENAL GLANDS: Adrenal glands demonstrate no acute abnormality. KIDNEYS, URETERS AND BLADDER: Simple bilateral renal cysts, measuring up to 1.9 cm in the right upper kidney, benign (Bosniak 1). No follow up is recommended. Mild fullness of the left upper pole renal moiety, unchanged,  likely reflecting prior proximal ureteral stricture when correlating with prior studies. No stones in the kidneys or ureters. No hydronephrosis. No perinephric or periureteral stranding. Urinary bladder is unremarkable. GI AND BOWEL: Sigmoid diverticulosis, without evidence of diverticulitis. Large duodenal diverticulum. Appendix is not completely visualized. Stomach is within normal limits. There is no bowel obstruction. No abnormal bowel wall thickening or distension. REPRODUCTIVE: Status post hysterectomy. PERITONEUM AND RETROPERITONEUM: No ascites or free air. LYMPH NODES: No lymphadenopathy. BONES AND SOFT TISSUES: No acute abnormality of the visualized bones. No focal soft tissue abnormality. IMPRESSION: 1. 4.5 cm ascending thoracic aortic aneurysm, unchanged. No evidence of dissection. 2. No pulmonary embolism. 3. Sigmoid diverticulosis without evidence of diverticulitis. 4. Additional ancillary findings as above. Electronically signed by: Pinkie Pebbles MD 05/05/2024 12:18 AM EDT RP Workstation: [id number]     Subjective: No acute issues or events overnight, abdominal pain ongoing but improving   Discharge Exam: Vitals:   05/09/24 0450 05/09/24 1235  BP: 117/68 132/80  Pulse: 75 79  Resp:  20  Temp: 97.6 F (36.4 C) 98.3 F (36.8 C)  SpO2: 95% 95%   Vitals:   05/08/24 1020 05/08/24 2018 05/09/24 0450 05/09/24 1235  BP: 111/61 (!) 117/56 117/68 132/80  Pulse: 74 86 75 79  Resp:  (!) 24  20  Temp: 98 F (36.7 C) 99 F (37.2 C) 97.6 F (36.4 C) 98.3 F (36.8 C)  TempSrc: Oral Oral Oral Oral  SpO2: 98% 100% 95% 95%  Weight:      Height:        General: Pt is alert, awake, not in acute distress Cardiovascular: RRR, S1/S2 +, no rubs, no gallops Respiratory: CTA bilaterally, no wheezing, no rhonchi Abdominal: Soft, NT, ND, bowel sounds + Extremities: no edema, no cyanosis    The results of significant diagnostics from this hospitalization (including imaging, microbiology,  ancillary and laboratory) are listed below for reference.     Microbiology: No results found for this or any previous visit (from the past 240 hours).   Labs: Basic Metabolic Panel: Recent Labs  Lab 05/04/24 2232 05/05/24 0410  NA 139 138  K 3.8 3.5  CL 105 105  CO2 26 23  GLUCOSE 119* 98  BUN 35* 32*  CREATININE 0.69 0.66  CALCIUM  8.3* 8.3*   Liver Function Tests: Recent Labs  Lab 05/04/24 2232 05/05/24 0410  AST 20 17  ALT 17 13  ALKPHOS 82 69  BILITOT 1.5* 1.4*  PROT 5.6* 5.2*  ALBUMIN 2.7* 2.6*   Recent Labs  Lab 05/04/24 2232  LIPASE 42   CBC: Recent Labs  Lab 05/04/24 2232 05/05/24 0410 05/05/24 0708 05/06/24 0236 05/06/24 1420 05/07/24 0346 05/08/24 0427 05/09/24 0354  WBC 9.9 7.7  --  5.3  --  6.1 5.7 6.1  NEUTROABS 7.9*  --   --   --   --   --   --   --   HGB 10.4* 10.0*   < > 7.7* 8.0* 7.7* 7.5* 7.4*  HCT 32.4* 31.9*   < > 24.3* 26.3* 24.6* 24.1* 22.6*  MCV 96.1 97.0  --  98.0  --  99.6 98.0 96.6  PLT 287 296  --  217  --  196 214 194   < > = values in this interval not displayed.   Urinalysis    Component Value Date/Time   COLORURINE YELLOW 05/04/2024 0950   APPEARANCEUR CLEAR 05/04/2024 0950   LABSPEC >1.046 (H) 05/04/2024 0950   PHURINE 5.0 05/04/2024 0950   GLUCOSEU NEGATIVE 05/04/2024 0950   HGBUR NEGATIVE 05/04/2024 0950   BILIRUBINUR NEGATIVE 05/04/2024 0950   KETONESUR NEGATIVE 05/04/2024 0950   PROTEINUR NEGATIVE 05/04/2024 0950   UROBILINOGEN 0.2 10/16/2013 1301   NITRITE NEGATIVE 05/04/2024 0950   LEUKOCYTESUR NEGATIVE 05/04/2024 0950   Sepsis Labs Recent Labs  Lab 05/06/24 0236 05/07/24 0346 05/08/24 0427 05/09/24 0354  WBC 5.3 6.1 5.7 6.1   Time coordinating discharge: Over 30 minutes  SIGNED:  Elsie JAYSON [place], DO [facility] 05/09/2024, 1:21 PM Pager   If 7PM-7AM, please contact night-coverage [email or web address]

## 2024-05-09 NOTE — Progress Notes (Signed)
 Subjective: Constipated. Abdominal discomfort, mild, persists. No blood in stool.  Objective: Vital signs in last 24 hours: Temp:  [97.6 F (36.4 C)-99 F (37.2 C)] 97.6 F (36.4 C) (07/23 0450) Pulse Rate:  [75-86] 75 (07/23 0450) Resp:  [24] 24 (07/22 2018) BP: (117)/(56-68) 117/68 (07/23 0450) SpO2:  [95 %-100 %] 95 % (07/23 0450) Weight change:  Last BM Date : 05/08/24  PE: GEN:  NAD HEENT:  Pale conjunctiva NEURO:  No encephalopathy LUNGS:  No visible distress  Lab Results: CBC    Component Value Date/Time   WBC 6.1 05/09/2024 0354   RBC 2.34 (L) 05/09/2024 0354   HGB 7.4 (L) 05/09/2024 0354   HCT 22.6 (L) 05/09/2024 0354   PLT 194 05/09/2024 0354   MCV 96.6 05/09/2024 0354   MCH 31.6 05/09/2024 0354   MCHC 32.7 05/09/2024 0354   RDW 14.1 05/09/2024 0354   LYMPHSABS 0.9 05/04/2024 2232   MONOABS 0.6 05/04/2024 2232   EOSABS 0.3 05/04/2024 2232   BASOSABS 0.1 05/04/2024 2232  CMP     Component Value Date/Time   NA 138 05/05/2024 0410   NA 142 09/09/2023 1125   K 3.5 05/05/2024 0410   CL 105 05/05/2024 0410   CO2 23 05/05/2024 0410   GLUCOSE 98 05/05/2024 0410   BUN 32 (H) 05/05/2024 0410   BUN 14 09/09/2023 1125   CREATININE 0.66 05/05/2024 0410   CALCIUM  8.3 (L) 05/05/2024 0410   PROT 5.2 (L) 05/05/2024 0410   ALBUMIN 2.6 (L) 05/05/2024 0410   AST 17 05/05/2024 0410   ALT 13 05/05/2024 0410   ALKPHOS 69 05/05/2024 0410   BILITOT 1.4 (H) 05/05/2024 0410   EGFR 66 09/09/2023 1125   GFRNONAA >60 05/05/2024 0410   Assessment:  Epigastric/back pain, negative CT, predating endoscopy.  Abd xray negative. 2.  Hematemesis, resolved. 3.  Duodenal ulcers one s/p endoscopic therapy. 4.  Constipation.  Plan:   Increase Miralax , add senokot for constipation, per hospitalists. Pantoprazole  40 mg po bid x 6 weeks, then 40 mg po every day thereafter indefinitely. Sucralfate  1 gram suspension po qac/at bedtime for 6 weeks, then stop. Ok to discharge home  from GI perspective; we will arrange outpatient follow-up with Dr. Dianna, patient's primary GI MD. [facility] will sign-off; please call with questions; thank you for the consultation.   Patricia [name] 05/09/2024, 11:24 AM   Cell [phone] If no answer or after 5 PM call [phone]

## 2024-05-09 NOTE — Progress Notes (Signed)
   Subjective:   Patient seen on behalf of Dr. Ernie.   She is now 2 weeks s/p right total knee arthroplasty which she reports is doing well  She anticipates discharge in the next few days, so we discussed plans for OPPT   Objective: Vital signs in last 24 hours: Temp:  [97.6 F (36.4 C)-99 F (37.2 C)] 97.6 F (36.4 C) (07/23 0450) Pulse Rate:  [75-86] 75 (07/23 0450) Resp:  [24] 24 (07/22 2018) BP: (117)/(56-68) 117/68 (07/23 0450) SpO2:  [95 %-100 %] 95 % (07/23 0450)  Intake/Output from previous day: No intake or output data in the 24 hours ending 05/09/24 1233   Intake/Output this shift: No intake/output data recorded.  Labs: Recent Labs    05/06/24 1420 05/07/24 0346 05/08/24 0427 05/09/24 0354  HGB 8.0* 7.7* 7.5* 7.4*   Recent Labs    05/08/24 0427 05/09/24 0354  WBC 5.7 6.1  RBC 2.46* 2.34*  HCT 24.1* 22.6*  PLT 214 194   No results for input(s): NA, K, CL, CO2, BUN, CREATININE, GLUCOSE, CALCIUM  in the last 72 hours. No results for input(s): LABPT, INR in the last 72 hours.  Exam: General - Patient is Alert and Oriented Extremity - Neurologically intact Sensation intact distally Intact pulses distally Dorsiflexion/Plantar flexion intact Dressing - dressing C/D/I, removed and re-dressed with mepilex Motor Function - intact, moving foot and toes well on exam.   Past Medical History:  Diagnosis Date   Aneurysm of aorta (HCC)    Arthritis    Atrophic vaginitis    Cellulitis and abscess of right leg 05/07/2018   Depression    Diverticulitis    DYSPNEA 06/04/2009   Qualifier: Diagnosis of  By: Jude MD, Harden GAILS.     Edema of both legs    She takes Lasix a couple of times per week to decrease swelling.     Fatty liver    GERD (gastroesophageal reflux disease)    History of kidney stones    Hyperlipidemia    Hypertension    Keratosis    hands and lower limbs   Lymphedema    legs   Memory difficulty    Obesity    Reflux     Shingles    Venous stasis    lower legs    Assessment/Plan: 4 Days Post-Op Procedure(s) (LRB): EGD (ESOPHAGOGASTRODUODENOSCOPY) (N/A) Principal Problem:   Upper GI bleed  Estimated body mass index is 27.46 kg/m as calculated from the following:   Height as of this encounter: 5' 4 (1.626 m).   Weight as of this encounter: 72.6 kg.   Incision is healing well, dressing removed and replaced only for hospital stay May remove at home and shower normally Post op apt next week  Discussed with her case manager, who will cancel PT this week, and resume next Tuesday  Rosina Calin, PA-C Orthopedic Surgery [phone] 05/09/2024, 12:33 PM

## 2024-05-09 NOTE — Plan of Care (Signed)

## 2024-05-15 DIAGNOSIS — M25561 Pain in right knee: Secondary | ICD-10-CM | POA: Diagnosis not present

## 2024-05-17 DIAGNOSIS — Z5189 Encounter for other specified aftercare: Secondary | ICD-10-CM | POA: Diagnosis not present

## 2024-05-17 DIAGNOSIS — M25552 Pain in left hip: Secondary | ICD-10-CM | POA: Diagnosis not present

## 2024-05-17 DIAGNOSIS — F3342 Major depressive disorder, recurrent, in full remission: Secondary | ICD-10-CM | POA: Diagnosis not present

## 2024-05-17 DIAGNOSIS — F331 Major depressive disorder, recurrent, moderate: Secondary | ICD-10-CM | POA: Diagnosis not present

## 2024-05-17 DIAGNOSIS — E782 Mixed hyperlipidemia: Secondary | ICD-10-CM | POA: Diagnosis not present

## 2024-05-17 DIAGNOSIS — E669 Obesity, unspecified: Secondary | ICD-10-CM | POA: Diagnosis not present

## 2024-05-18 DIAGNOSIS — M25561 Pain in right knee: Secondary | ICD-10-CM | POA: Diagnosis not present

## 2024-05-23 DIAGNOSIS — E782 Mixed hyperlipidemia: Secondary | ICD-10-CM | POA: Diagnosis not present

## 2024-05-23 DIAGNOSIS — M25561 Pain in right knee: Secondary | ICD-10-CM | POA: Diagnosis not present

## 2024-05-23 DIAGNOSIS — Z6828 Body mass index (BMI) 28.0-28.9, adult: Secondary | ICD-10-CM | POA: Diagnosis not present

## 2024-05-23 DIAGNOSIS — I10 Essential (primary) hypertension: Secondary | ICD-10-CM | POA: Diagnosis not present

## 2024-05-23 DIAGNOSIS — E663 Overweight: Secondary | ICD-10-CM | POA: Diagnosis not present

## 2024-05-23 DIAGNOSIS — K760 Fatty (change of) liver, not elsewhere classified: Secondary | ICD-10-CM | POA: Diagnosis not present

## 2024-05-23 DIAGNOSIS — Z8639 Personal history of other endocrine, nutritional and metabolic disease: Secondary | ICD-10-CM | POA: Diagnosis not present

## 2024-05-24 DIAGNOSIS — Z1231 Encounter for screening mammogram for malignant neoplasm of breast: Secondary | ICD-10-CM | POA: Diagnosis not present

## 2024-05-25 DIAGNOSIS — I10 Essential (primary) hypertension: Secondary | ICD-10-CM | POA: Diagnosis not present

## 2024-05-25 DIAGNOSIS — Z1331 Encounter for screening for depression: Secondary | ICD-10-CM | POA: Diagnosis not present

## 2024-05-25 DIAGNOSIS — K5730 Diverticulosis of large intestine without perforation or abscess without bleeding: Secondary | ICD-10-CM | POA: Diagnosis not present

## 2024-05-25 DIAGNOSIS — K760 Fatty (change of) liver, not elsewhere classified: Secondary | ICD-10-CM | POA: Diagnosis not present

## 2024-05-25 DIAGNOSIS — Z0000 Encounter for general adult medical examination without abnormal findings: Secondary | ICD-10-CM | POA: Diagnosis not present

## 2024-05-25 DIAGNOSIS — K279 Peptic ulcer, site unspecified, unspecified as acute or chronic, without hemorrhage or perforation: Secondary | ICD-10-CM | POA: Diagnosis not present

## 2024-05-25 DIAGNOSIS — M25561 Pain in right knee: Secondary | ICD-10-CM | POA: Diagnosis not present

## 2024-05-25 DIAGNOSIS — D500 Iron deficiency anemia secondary to blood loss (chronic): Secondary | ICD-10-CM | POA: Diagnosis not present

## 2024-05-25 DIAGNOSIS — E782 Mixed hyperlipidemia: Secondary | ICD-10-CM | POA: Diagnosis not present

## 2024-05-25 DIAGNOSIS — R41840 Attention and concentration deficit: Secondary | ICD-10-CM | POA: Diagnosis not present

## 2024-05-25 DIAGNOSIS — F331 Major depressive disorder, recurrent, moderate: Secondary | ICD-10-CM | POA: Diagnosis not present

## 2024-05-25 DIAGNOSIS — I2510 Atherosclerotic heart disease of native coronary artery without angina pectoris: Secondary | ICD-10-CM | POA: Diagnosis not present

## 2024-05-25 DIAGNOSIS — I712 Thoracic aortic aneurysm, without rupture, unspecified: Secondary | ICD-10-CM | POA: Diagnosis not present

## 2024-05-25 LAB — LAB REPORT - SCANNED
Creatinine, POC: 244 mg/dL
EGFR: 85
Microalb Creat Ratio: 6.7
Microalbumin, Urine: 1.64

## 2024-05-28 DIAGNOSIS — M25561 Pain in right knee: Secondary | ICD-10-CM | POA: Diagnosis not present

## 2024-05-29 DIAGNOSIS — K269 Duodenal ulcer, unspecified as acute or chronic, without hemorrhage or perforation: Secondary | ICD-10-CM | POA: Diagnosis not present

## 2024-05-29 DIAGNOSIS — D62 Acute posthemorrhagic anemia: Secondary | ICD-10-CM | POA: Diagnosis not present

## 2024-06-01 DIAGNOSIS — M25561 Pain in right knee: Secondary | ICD-10-CM | POA: Diagnosis not present

## 2024-06-04 DIAGNOSIS — M25561 Pain in right knee: Secondary | ICD-10-CM | POA: Diagnosis not present

## 2024-06-06 DIAGNOSIS — Z471 Aftercare following joint replacement surgery: Secondary | ICD-10-CM | POA: Diagnosis not present

## 2024-06-06 DIAGNOSIS — Z96651 Presence of right artificial knee joint: Secondary | ICD-10-CM | POA: Diagnosis not present

## 2024-06-06 DIAGNOSIS — M25561 Pain in right knee: Secondary | ICD-10-CM | POA: Diagnosis not present

## 2024-06-06 DIAGNOSIS — M7062 Trochanteric bursitis, left hip: Secondary | ICD-10-CM | POA: Diagnosis not present

## 2024-06-12 DIAGNOSIS — M25561 Pain in right knee: Secondary | ICD-10-CM | POA: Diagnosis not present

## 2024-06-15 DIAGNOSIS — M25561 Pain in right knee: Secondary | ICD-10-CM | POA: Diagnosis not present

## 2024-06-17 DIAGNOSIS — F3342 Major depressive disorder, recurrent, in full remission: Secondary | ICD-10-CM | POA: Diagnosis not present

## 2024-06-17 DIAGNOSIS — E669 Obesity, unspecified: Secondary | ICD-10-CM | POA: Diagnosis not present

## 2024-06-17 DIAGNOSIS — F331 Major depressive disorder, recurrent, moderate: Secondary | ICD-10-CM | POA: Diagnosis not present

## 2024-06-17 DIAGNOSIS — E782 Mixed hyperlipidemia: Secondary | ICD-10-CM | POA: Diagnosis not present

## 2024-06-19 DIAGNOSIS — M25561 Pain in right knee: Secondary | ICD-10-CM | POA: Diagnosis not present

## 2024-06-20 DIAGNOSIS — Z6828 Body mass index (BMI) 28.0-28.9, adult: Secondary | ICD-10-CM | POA: Diagnosis not present

## 2024-06-20 DIAGNOSIS — K760 Fatty (change of) liver, not elsewhere classified: Secondary | ICD-10-CM | POA: Diagnosis not present

## 2024-06-20 DIAGNOSIS — N3941 Urge incontinence: Secondary | ICD-10-CM | POA: Diagnosis not present

## 2024-06-20 DIAGNOSIS — E663 Overweight: Secondary | ICD-10-CM | POA: Diagnosis not present

## 2024-06-20 DIAGNOSIS — I10 Essential (primary) hypertension: Secondary | ICD-10-CM | POA: Diagnosis not present

## 2024-06-20 DIAGNOSIS — Z8639 Personal history of other endocrine, nutritional and metabolic disease: Secondary | ICD-10-CM | POA: Diagnosis not present

## 2024-06-20 DIAGNOSIS — E782 Mixed hyperlipidemia: Secondary | ICD-10-CM | POA: Diagnosis not present

## 2024-06-26 DIAGNOSIS — M25561 Pain in right knee: Secondary | ICD-10-CM | POA: Diagnosis not present

## 2024-07-04 DIAGNOSIS — M25561 Pain in right knee: Secondary | ICD-10-CM | POA: Diagnosis not present

## 2024-07-17 DIAGNOSIS — E669 Obesity, unspecified: Secondary | ICD-10-CM | POA: Diagnosis not present

## 2024-07-17 DIAGNOSIS — E782 Mixed hyperlipidemia: Secondary | ICD-10-CM | POA: Diagnosis not present

## 2024-07-17 DIAGNOSIS — F331 Major depressive disorder, recurrent, moderate: Secondary | ICD-10-CM | POA: Diagnosis not present

## 2024-07-17 DIAGNOSIS — F3342 Major depressive disorder, recurrent, in full remission: Secondary | ICD-10-CM | POA: Diagnosis not present

## 2024-07-19 DIAGNOSIS — D485 Neoplasm of uncertain behavior of skin: Secondary | ICD-10-CM | POA: Diagnosis not present

## 2024-07-19 DIAGNOSIS — C44329 Squamous cell carcinoma of skin of other parts of face: Secondary | ICD-10-CM | POA: Diagnosis not present

## 2024-07-19 DIAGNOSIS — L821 Other seborrheic keratosis: Secondary | ICD-10-CM | POA: Diagnosis not present

## 2024-07-19 DIAGNOSIS — L814 Other melanin hyperpigmentation: Secondary | ICD-10-CM | POA: Diagnosis not present

## 2024-07-19 DIAGNOSIS — D1801 Hemangioma of skin and subcutaneous tissue: Secondary | ICD-10-CM | POA: Diagnosis not present

## 2024-07-19 DIAGNOSIS — L570 Actinic keratosis: Secondary | ICD-10-CM | POA: Diagnosis not present

## 2024-07-19 DIAGNOSIS — Z85828 Personal history of other malignant neoplasm of skin: Secondary | ICD-10-CM | POA: Diagnosis not present

## 2024-07-24 DIAGNOSIS — K209 Esophagitis, unspecified without bleeding: Secondary | ICD-10-CM | POA: Diagnosis not present

## 2024-07-24 DIAGNOSIS — K449 Diaphragmatic hernia without obstruction or gangrene: Secondary | ICD-10-CM | POA: Diagnosis not present

## 2024-07-24 DIAGNOSIS — K3189 Other diseases of stomach and duodenum: Secondary | ICD-10-CM | POA: Diagnosis not present

## 2024-07-24 DIAGNOSIS — K317 Polyp of stomach and duodenum: Secondary | ICD-10-CM | POA: Diagnosis not present

## 2024-07-24 DIAGNOSIS — K269 Duodenal ulcer, unspecified as acute or chronic, without hemorrhage or perforation: Secondary | ICD-10-CM | POA: Diagnosis not present

## 2024-07-24 DIAGNOSIS — K2970 Gastritis, unspecified, without bleeding: Secondary | ICD-10-CM | POA: Diagnosis not present

## 2024-07-24 DIAGNOSIS — K5710 Diverticulosis of small intestine without perforation or abscess without bleeding: Secondary | ICD-10-CM | POA: Diagnosis not present

## 2024-07-30 ENCOUNTER — Other Ambulatory Visit (HOSPITAL_COMMUNITY): Payer: Self-pay

## 2024-07-30 DIAGNOSIS — K269 Duodenal ulcer, unspecified as acute or chronic, without hemorrhage or perforation: Secondary | ICD-10-CM | POA: Diagnosis not present

## 2024-07-31 ENCOUNTER — Other Ambulatory Visit: Payer: Self-pay

## 2024-08-22 DIAGNOSIS — M25552 Pain in left hip: Secondary | ICD-10-CM | POA: Diagnosis not present

## 2024-09-10 DIAGNOSIS — Z85828 Personal history of other malignant neoplasm of skin: Secondary | ICD-10-CM | POA: Diagnosis not present

## 2024-09-10 DIAGNOSIS — C44329 Squamous cell carcinoma of skin of other parts of face: Secondary | ICD-10-CM | POA: Diagnosis not present

## 2024-09-16 DIAGNOSIS — F331 Major depressive disorder, recurrent, moderate: Secondary | ICD-10-CM | POA: Diagnosis not present

## 2024-09-16 DIAGNOSIS — F3342 Major depressive disorder, recurrent, in full remission: Secondary | ICD-10-CM | POA: Diagnosis not present

## 2024-09-16 DIAGNOSIS — E782 Mixed hyperlipidemia: Secondary | ICD-10-CM | POA: Diagnosis not present

## 2024-09-16 DIAGNOSIS — E669 Obesity, unspecified: Secondary | ICD-10-CM | POA: Diagnosis not present

## 2024-09-21 ENCOUNTER — Ambulatory Visit (HOSPITAL_COMMUNITY)

## 2024-09-25 ENCOUNTER — Ambulatory Visit (HOSPITAL_COMMUNITY): Admission: RE | Admit: 2024-09-25 | Discharge: 2024-09-25 | Attending: Cardiology | Admitting: Cardiology

## 2024-09-25 DIAGNOSIS — I77810 Thoracic aortic ectasia: Secondary | ICD-10-CM | POA: Diagnosis not present

## 2024-09-25 DIAGNOSIS — H5213 Myopia, bilateral: Secondary | ICD-10-CM | POA: Diagnosis not present

## 2024-09-25 DIAGNOSIS — H04123 Dry eye syndrome of bilateral lacrimal glands: Secondary | ICD-10-CM | POA: Diagnosis not present

## 2024-09-25 DIAGNOSIS — H5712 Ocular pain, left eye: Secondary | ICD-10-CM | POA: Diagnosis not present

## 2024-09-25 DIAGNOSIS — Z961 Presence of intraocular lens: Secondary | ICD-10-CM | POA: Diagnosis not present

## 2024-09-25 DIAGNOSIS — H11002 Unspecified pterygium of left eye: Secondary | ICD-10-CM | POA: Diagnosis not present

## 2024-09-25 MED ORDER — IOHEXOL 350 MG/ML SOLN
75.0000 mL | Freq: Once | INTRAVENOUS | Status: AC | PRN
  Administered 2024-09-25: 75 mL via INTRAVENOUS

## 2024-10-03 ENCOUNTER — Ambulatory Visit: Payer: Self-pay | Admitting: Cardiology

## 2024-11-01 ENCOUNTER — Ambulatory Visit: Admitting: Physician Assistant

## 2024-11-06 NOTE — Progress Notes (Addendum)
 Patricia [name]                                          MRN: [id number]   11/06/2024   The [facility] Quality Team Specialist reviewed this patient medical record for the purposes of chart review for care gap closure. The following were reviewed: abstraction for care gap closure-controlling blood pressure.    [facility] Quality Team

## 2024-11-12 NOTE — Progress Notes (Unsigned)
 "  Cardiology Office Note   Date:  11/13/2024  ID:  Patricia [name], DOB [birth year], MRN [id number] PCP:  Loreli Kins, MD  Cardiologist:  Oneil Parchment, MD  Electrophysiologist:  None   Chief Complaint: Follow up for CAD  History of Present Illness: .   Patricia [name] is a 74 y.o. 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  Coronary CTA in 05/2020 indicated coronary calcium  score of 348, 80th percentile for age and sex matched control, LAD calcified proximally with 0 to 24% stenosis, ramus fix plaque proximally with 50 to 69% stenosis, RCA with calcified plaque with 0 to 24% stenosis, dilated ascending aorta 43 mm stable from prior.  CT FFR analysis showed no flow limitation in the coronary arteries.  Echocardiogram in 05/2021 indicated LVEF 60 to 65%, no RWMA, G1 DD, RV systolic function and size was normal, mild mitral valve registration with no evidence of stenosis, aortic valve regurgitation not visualized, no stenosis present, mild dilation of the acing and aorta measuring 43 mm.  Last echocardiogram in 08/2023 indicated LVEF 65 to 70%, no RWMA, mild concentric LVH, diastolic parameters were normal, RV systolic function and size was normal, no evidence of mitral regurgitation or stenosis, no aortic valve regurgitation or stenosis visualized, dilation of the ascending aorta measuring 47 mm.  Patient admitted in 04/2024 with upper GI bleed.   CT angio chest aorta on 09/25/2024 indicated stable 4.5 cm dilation of the ascending aorta.  Today she presents for follow-up.  She reports that she has been doing well.  She continues to note episodes occasional epigastric chest discomfort that can radiate to the back and the right jaw, patient notes that she has been presenting with this since her initial evaluation in 2021.  Patient's are periodic and self resolve, not associated with  exertion.  She denies any chest pressure, tightness or discomfort on exertion, denies any associated shortness of breath.  She notes intermittent lower extremity edema related to history of lymphedema.  She notes occasional feelings of skipped beat sensation, reports these have been present for years and are not significantly changed and nonbothersome.  She denies any presyncope or syncope. ROS: .   Today she denies shortness of breath, lower extremity edema, fatigue, melena, hematuria, hemoptysis, diaphoresis, weakness, presyncope, syncope, orthopnea, and PND.  All other systems are reviewed and otherwise negative. Studies Reviewed: SABRA   EKG:  EKG is not ordered today.  CV Studies: Cardiac studies reviewed are outlined and summarized above. Otherwise please see EMR for full report. Cardiac Studies & Procedures   ______________________________________________________________________________________________   STRESS TESTS  MYOCARDIAL PERFUSION IMAGING 01/25/2017  Interpretation Summary  Nuclear stress EF: 69%.  There was no ST segment deviation noted during stress.  The study is normal.  The left ventricular ejection fraction is hyperdynamic (>65%).  Normal stress nuclear study with no ischemia or infarction; EF 69 with normal wall motion.   ECHOCARDIOGRAM  ECHOCARDIOGRAM COMPLETE 09/01/2023  Narrative ECHOCARDIOGRAM REPORT    Patient Name:   Patricia [name] Date of Exam: 09/01/2023 Medical Rec #:  [id number]           Height:       64.0 in Accession #:    [id number]          Weight:       174.0 lb Date of Birth:  06-Nov-1950           BSA:  1.844 m Patient Age:    72 years            BP:           130/80 mmHg Patient Gender: F                   HR:           98 bpm. Exam Location:  [address]  Procedure: 2D Echo, Cardiac Doppler and Color Doppler  Indications:    I77.810 Dilated Aortic Root  History:        Patient has prior history of Echocardiogram  examinations, most recent 05/21/2021. Signs/Symptoms:Dyspnea; Risk Factors:Hypertension and Dyslipidemia. Lower extremity edema.  Sonographer:    Carl Rodgers-Jones RDCS Referring Phys: [id number] MARK C SKAINS  IMPRESSIONS   1. Left ventricular ejection fraction, by estimation, is 65 to 70%. The left ventricle has normal function. The left ventricle has no regional wall motion abnormalities. There is mild concentric left ventricular hypertrophy. Left ventricular diastolic parameters were normal. 2. Right ventricular systolic function is normal. The right ventricular size is normal. 3. The mitral valve is normal in structure. No evidence of mitral valve regurgitation. No evidence of mitral stenosis. 4. The aortic valve is normal in structure. Aortic valve regurgitation is not visualized. No aortic stenosis is present. 5. Aortic dilatation noted. There is dilatation of the ascending aorta, measuring 47 mm. 6. The inferior vena cava is normal in size with greater than 50% respiratory variability, suggesting right atrial pressure of 3 mmHg.  FINDINGS Left Ventricle: Left ventricular ejection fraction, by estimation, is 65 to 70%. The left ventricle has normal function. The left ventricle has no regional wall motion abnormalities. The left ventricular internal cavity size was small. There is mild concentric left ventricular hypertrophy. Left ventricular diastolic parameters were normal.  Right Ventricle: The right ventricular size is normal. No increase in right ventricular wall thickness. Right ventricular systolic function is normal.  Left Atrium: Left atrial size was normal in size.  Right Atrium: Right atrial size was normal in size.  Pericardium: There is no evidence of pericardial effusion.  Mitral Valve: The mitral valve is normal in structure. No evidence of mitral valve regurgitation. No evidence of mitral valve stenosis.  Tricuspid Valve: The tricuspid valve is normal in structure.  Tricuspid valve regurgitation is not demonstrated. No evidence of tricuspid stenosis.  Aortic Valve: The aortic valve is normal in structure. Aortic valve regurgitation is not visualized. No aortic stenosis is present.  Pulmonic Valve: The pulmonic valve was normal in structure. Pulmonic valve regurgitation is not visualized. No evidence of pulmonic stenosis.  Aorta: The aortic root is normal in size and structure and aortic dilatation noted. There is dilatation of the ascending aorta, measuring 47 mm.  Venous: The inferior vena cava is normal in size with greater than 50% respiratory variability, suggesting right atrial pressure of 3 mmHg.  IAS/Shunts: No atrial level shunt detected by color flow Doppler.   LEFT VENTRICLE PLAX 2D LVIDd:         2.90 cm   Diastology LVIDs:         1.90 cm   LV e' medial:    6.49 cm/s LV PW:         1.00 cm   LV E/e' medial:  8.2 LV IVS:        1.30 cm   LV e' lateral:   11.77 cm/s LVOT diam:     2.00 cm   LV E/e'  lateral: 4.5 LV SV:         74 LV SV Index:   40 LVOT Area:     3.14 cm   RIGHT VENTRICLE             IVC RV Basal diam:  3.70 cm     IVC diam: 0.80 cm RV S prime:     16.65 cm/s TAPSE (M-mode): 2.5 cm  LEFT ATRIUM             Index        RIGHT ATRIUM           Index LA diam:        3.80 cm 2.06 cm/m   RA Area:     10.50 cm LA Vol (A2C):   67.9 ml 36.82 ml/m  RA Volume:   22.50 ml  12.20 ml/m LA Vol (A4C):   24.8 ml 13.45 ml/m LA Biplane Vol: 43.7 ml 23.70 ml/m AORTIC VALVE LVOT Vmax:   155.25 cm/s LVOT Vmean:  104.750 cm/s LVOT VTI:    0.236 m  AORTA Ao Root diam: 3.30 cm Ao Asc diam:  4.70 cm  MITRAL VALVE                TRICUSPID VALVE MV Area (PHT): 5.25 cm     TR Peak grad:   19.9 mmHg MV Decel Time: 145 msec     TR Vmax:        223.00 cm/s MV E velocity: 53.00 cm/s MV A velocity: 116.00 cm/s  SHUNTS MV E/A ratio:  0.46         Systemic VTI:  0.24 m Systemic Diam: 2.00 cm  Aditya Sabharwal Electronically  signed by Ria Commander Signature Date/Time: 09/01/2023/1:42:55 PM    Final    MONITORS  CARDIAC EVENT MONITOR 11/16/2019  Narrative  Normal sinus rhythm (AVG 77 bpm)  No atrial fibrillation  No pauses  No PVC's or PAC's  Reassuring montior.  Oneil Parchment, MD   CT SCANS  CT CORONARY FRACTIONAL FLOW RESERVE DATA PREP 05/19/2020  Narrative EXAM: FFRCT ANALYSIS  74 year old female with abnormal CT coronary arteries.  FINDINGS: FFRct analysis was performed on the original cardiac CT angiogram dataset. Diagrammatic representation of the FFRct analysis is provided in a separate PDF document in PACS. This dictation was created using the PDF document and an interactive 3D model of the results. 3D model is not available in the EMR/PACS. Normal FFR range is >0.80.  1. All three coronary branches demonstrate normal FFR range through distal vessels.  IMPRESSION .: IMPRESSION . 1.  There is no flow limitation in coronary arteries.  Oneil Parchment, MD [facility]  Note: These examples are not recommendations of [employer] and only provided as examples of what other customers are doing.   Electronically Signed By: Oneil Parchment MD On: 05/21/2020 05:59   CT CORONARY MORPH W/CTA COR W/SCORE 05/19/2020  Addendum 05/19/2020  9:46 AM ADDENDUM REPORT: 05/19/2020 09:43  CLINICAL DATA:  74 year old female with chest and jaw pain with known aortic dilation and atherosclerosis.  EXAM: Cardiac/Coronary  CTA  TECHNIQUE: The patient was scanned on a [employer].  FINDINGS: A 90 kV prospective scan was triggered in the descending thoracic aorta at 111 HU's. Axial non-contrast 3 mm slices were carried out through the heart. The data set was analyzed on a dedicated work station and scored using the Agatson method. Gantry rotation speed was 250 msecs and collimation was .6  mm. Beta blockade and 0.8 mg of sl NTG was given. The 3D data set was reconstructed in 5%  intervals of the 67-82 % of the R-R cycle. Diastolic phases were analyzed on a dedicated work station using MPR, MIP and VRT modes. The patient received 80 cc of contrast.  Aorta: 43 mm ascending aorta, dilated, stable from prior. Both ascending and descending atherosclerosis. No dissection.  Aortic Valve:  Trileaflet.  No calcifications.  Coronary Arteries:  Normal coronary origin.  Right dominance.  RCA is a large dominant artery that gives rise to PDA and PLA. Proximal calcified plaque with 0-24% stenosis  Left main is a large artery that gives rise to LAD and LCX arteries.  LAD is a large vessel that has calcified plaque proximally with 0-24% stenosis.  Ramus mixed plaque proximally with 50-69% stenosis. Sending for FFR analysis  LCX is a non-dominant artery that gives rise to one large OM1 branch. There is no plaque.  Other findings:  Normal pulmonary vein drainage into the left atrium.  Normal left atrial appendage without a thrombus.  Normal size of the pulmonary artery.  Please see radiology report for non cardiac findings.  IMPRESSION: 1. Coronary calcium  score of 348. This was 57 percentile for age and sex matched control.  2. Normal coronary origin with right dominance.  3.  There is LAD calcified plaque proximally with 0-24% stenosis.  4. There is Ramus mixed plaque proximally with 50-69% stenosis. Sending for FFR analysis.  5.  There is RCA calcified plaque with 0-24% stenosis.  6. Dilated ascending aorta, 43 mm, stable from prior. Continue to monitor annually.  Oneil Parchment, MD [facility]   Electronically Signed By: Oneil Parchment MD On: 05/19/2020 09:43  Narrative EXAM: OVER-READ INTERPRETATION  CT CHEST  The following report is an over-read performed by radiologist Dr. Franky Crease of [facility] on 05/19/2020. This over-read does not include interpretation of cardiac or coronary anatomy or pathology. The coronary CTA interpretation  by the cardiologist is attached.  COMPARISON:  03/21/2019  FINDINGS: Vascular: Thoracic aortic aneurysm with the ascending thoracic aorta measuring up to 4.3 cm compared to 4.4 cm previously. Aortic atherosclerosis.  Mediastinum/Nodes: No adenopathy  Lungs/Pleura: No confluent opacities or effusions.  Upper Abdomen: Imaging into the upper abdomen shows no acute findings.  Musculoskeletal: Bilateral breast implants. No acute bony abnormality.  IMPRESSION: 4.3 cm ascending thoracic aortic aneurysm, stable since prior study. Recommend annual imaging followup by CTA or MRA. This recommendation follows 2010 ACCF/AHA/AATS/ACR/ASA/SCA/SCAI/SIR/STS/SVM Guidelines for the Diagnosis and Management of Patients with Thoracic Aortic Disease. Circulation. 2010; 121: Z733-z630. Aortic aneurysm NOS (ICD10-I71.9)  No acute extra cardiac abnormality.  Electronically Signed: By: Franky Crease M.D. On: 05/19/2020 09:33     ______________________________________________________________________________________________       Current Reported Medications:.    Active Medications[1]  Physical Exam:    VS:  BP 108/66   Pulse 69   Ht 5' 5 (1.651 m)   Wt 175 lb 9.6 oz (79.7 kg)   SpO2 98%   BMI 29.22 kg/m    Wt Readings from Last 3 Encounters:  11/13/24 175 lb 9.6 oz (79.7 kg)  05/05/24 160 lb (72.6 kg)  11/02/23 177 lb 0.5 oz (80.3 kg)    GEN: Well nourished, well developed in no acute distress NECK: No JVD; No carotid bruits CARDIAC: RRR, no murmurs, rubs, gallops RESPIRATORY:  Clear to auscultation without rales, wheezing or rhonchi  ABDOMEN: Soft, non-tender, non-distended EXTREMITIES:  No edema; No  acute deformity     Asessement and Plan:.    Nonobstructive CAD: Coronary CTA in 05/2020 indicated coronary calcium  score of 348, 80th percentile for age and sex matched control, LAD calcified proximally with 0 to 24% stenosis, ramus fix plaque proximally with 50 to 69% stenosis,  RCA with calcified plaque with 0 to 24% stenosis, dilated ascending aorta 43 mm stable from prior.  CT FFR analysis showed no flow limitation in the coronary arteries. Today patient denies any new chest pain, tightness or discomfort on exertion.  She continues to note an epigastric discomfort associated with right jaw and back pain that has been present for many years.  Episodes are infrequent and self-limiting and not associated with exertion.  She denies associated shortness of breath.  Again this have been present for years even prior to coronary CTA noted above. Stable with no anginal symptoms. No indication for ischemic evaluation.  Heart healthy diet and regular cardiovascular exercise encouraged.  Patient currently no longer on aspirin  81 mg daily given upper GI bleeding earlier this year, encouraged to discuss with her PCP and gastroenterologist.  Reviewed ED precautions.  Continue Crestor  20 mg daily, amlodipine  5 mg daily metoprolol  succinate 50 mg daily.  Ascending aorta dilation: Patient with history of thoracic ascending aorta dilation.  CTA in 12/25 indicated 4.5 cm dilation.  To have repeat CT a chest aorta in 1 year.  Blood pressure well-controlled.  Hypertension: Blood pressure today 108/66.  Continue current antihypertensive regimen.  Palpitations: Notes occasional palpitations, described as skipped beats. Not bothersome or significantly changed.  Continue metoprolol  succinate 50 mg daily.  Hyperlipidemia: Last lipid profile indicated LDL 49 on 05/25/2024.  Continue Crestor  20 mg daily.   Disposition: F/u with Dr. Jeffrie in one year or sooner if needed.   Signed, Cailey Trigueros D Dione Mccombie, NP       [1]  Current Meds  Medication Sig   acetaminophen  (TYLENOL ) 650 MG CR tablet Take 650 mg by mouth every 8 (eight) hours as needed for pain.   ALPRAZolam  (XANAX ) 1 MG tablet Take 1 mg by mouth as needed (anxiety when flying).   amLODipine  (NORVASC ) 5 MG tablet 1 tablet Orally Once a day; Duration:  90 days   desvenlafaxine (PRISTIQ) 50 MG 24 hr tablet Take 50-100 mg by mouth See admin instructions. Take 50 mg daily, may increase to 100 mg daily as needed for depression   diclofenac (VOLTAREN) 75 MG EC tablet TAKE 1 TABLET TWICE A DAY BY ORAL ROUTE WITH MEAL(S).   metoprolol  succinate (TOPROL -XL) 50 MG 24 hr tablet Take 50 mg by mouth daily.   polyethylene glycol (MIRALAX  / GLYCOLAX ) 17 g packet Take 17 g by mouth 2 (two) times daily.   rosuvastatin  (CRESTOR ) 20 MG tablet TAKE ONE TABLET BY MOUTH ONE TIME DAILY   triamterene -hydrochlorothiazide  (MAXZIDE -25) 37.5-25 MG per tablet Take 1 tablet by mouth daily.     "

## 2024-11-13 ENCOUNTER — Ambulatory Visit: Attending: Cardiology | Admitting: Cardiology

## 2024-11-13 ENCOUNTER — Encounter: Payer: Self-pay | Admitting: Cardiology

## 2024-11-13 VITALS — BP 108/66 | HR 69 | Ht 65.0 in | Wt 175.6 lb

## 2024-11-13 DIAGNOSIS — E782 Mixed hyperlipidemia: Secondary | ICD-10-CM

## 2024-11-13 DIAGNOSIS — I2510 Atherosclerotic heart disease of native coronary artery without angina pectoris: Secondary | ICD-10-CM | POA: Diagnosis not present

## 2024-11-13 DIAGNOSIS — I77810 Thoracic aortic ectasia: Secondary | ICD-10-CM | POA: Diagnosis not present

## 2024-11-13 NOTE — Patient Instructions (Signed)
 Medication Instructions:  Your physician recommends that you continue on your current medications as directed. Please refer to the Current Medication list given to you today.  *If you need a refill on your cardiac medications before your next appointment, please call your pharmacy*  Lab Work: NONE If you have labs (blood work) drawn today and your tests are completely normal, you will receive your results only by: MyChart Message (if you have MyChart) OR A paper copy in the mail If you have any lab test that is abnormal or we need to change your treatment, we will call you to review the results.  Testing/Procedures: NONE  Follow-Up: At [facility], you and your health needs are our priority.  As part of our continuing mission to provide you with exceptional heart care, our providers are all part of one team.  This team includes your primary Cardiologist (physician) and Advanced Practice Providers or APPs (Physician Assistants and Nurse Practitioners) who all work together to provide you with the care you need, when you need it.  Your next appointment:   1 year(s)  Provider:   Oneil Parchment, MD
